# Patient Record
Sex: Female | Born: 1935 | Race: White | Hispanic: No | Smoking: Never smoker
Health system: Southern US, Community
[De-identification: ages and names within clinical notes are randomized; demographics above are authoritative.]

## PROBLEM LIST (undated history)

## (undated) DIAGNOSIS — F0280 Dementia in other diseases classified elsewhere without behavioral disturbance: Secondary | ICD-10-CM

## (undated) DIAGNOSIS — N812 Incomplete uterovaginal prolapse: Secondary | ICD-10-CM

## (undated) DIAGNOSIS — I10 Essential (primary) hypertension: Secondary | ICD-10-CM

## (undated) DIAGNOSIS — N8110 Cystocele, unspecified: Secondary | ICD-10-CM

## (undated) DIAGNOSIS — G309 Alzheimer's disease, unspecified: Secondary | ICD-10-CM

## (undated) DIAGNOSIS — C4490 Unspecified malignant neoplasm of skin, unspecified: Secondary | ICD-10-CM

## (undated) DIAGNOSIS — N816 Rectocele: Secondary | ICD-10-CM

## (undated) DIAGNOSIS — Z960 Presence of urogenital implants: Secondary | ICD-10-CM

## (undated) DIAGNOSIS — E039 Hypothyroidism, unspecified: Secondary | ICD-10-CM

## (undated) DIAGNOSIS — J189 Pneumonia, unspecified organism: Secondary | ICD-10-CM

## (undated) DIAGNOSIS — E7800 Pure hypercholesterolemia, unspecified: Secondary | ICD-10-CM

## (undated) DIAGNOSIS — S72009A Fracture of unspecified part of neck of unspecified femur, initial encounter for closed fracture: Secondary | ICD-10-CM

## (undated) HISTORY — DX: Rectocele: N81.10

## (undated) HISTORY — PX: FRACTURE SURGERY: SHX138

## (undated) HISTORY — PX: CHOLECYSTECTOMY: SHX55

## (undated) HISTORY — DX: Incomplete uterovaginal prolapse: N81.2

## (undated) HISTORY — DX: Fracture of unspecified part of neck of unspecified femur, initial encounter for closed fracture: S72.009A

## (undated) HISTORY — DX: Rectocele: N81.6

## (undated) HISTORY — PX: WRIST FRACTURE SURGERY: SHX121

---

## 1998-01-16 ENCOUNTER — Other Ambulatory Visit: Admission: RE | Admit: 1998-01-16 | Discharge: 1998-01-16 | Payer: Self-pay | Admitting: Family Medicine

## 1998-01-23 ENCOUNTER — Other Ambulatory Visit: Admission: RE | Admit: 1998-01-23 | Discharge: 1998-01-23 | Payer: Self-pay | Admitting: Family Medicine

## 1999-01-26 ENCOUNTER — Other Ambulatory Visit: Admission: RE | Admit: 1999-01-26 | Discharge: 1999-01-26 | Payer: Self-pay | Admitting: Family Medicine

## 1999-05-18 ENCOUNTER — Encounter: Admission: RE | Admit: 1999-05-18 | Discharge: 1999-05-18 | Payer: Self-pay | Admitting: Family Medicine

## 1999-05-18 ENCOUNTER — Encounter: Payer: Self-pay | Admitting: Family Medicine

## 1999-05-27 ENCOUNTER — Encounter: Payer: Self-pay | Admitting: General Surgery

## 1999-06-02 ENCOUNTER — Encounter: Payer: Self-pay | Admitting: General Surgery

## 1999-06-02 ENCOUNTER — Encounter (INDEPENDENT_AMBULATORY_CARE_PROVIDER_SITE_OTHER): Payer: Self-pay

## 1999-06-02 ENCOUNTER — Inpatient Hospital Stay (HOSPITAL_COMMUNITY): Admission: RE | Admit: 1999-06-02 | Discharge: 1999-06-04 | Payer: Self-pay | Admitting: General Surgery

## 1999-06-03 ENCOUNTER — Encounter: Payer: Self-pay | Admitting: General Surgery

## 1999-07-02 ENCOUNTER — Ambulatory Visit (HOSPITAL_COMMUNITY): Admission: RE | Admit: 1999-07-02 | Discharge: 1999-07-02 | Payer: Self-pay | Admitting: General Surgery

## 1999-07-02 ENCOUNTER — Encounter: Payer: Self-pay | Admitting: General Surgery

## 2000-01-26 ENCOUNTER — Other Ambulatory Visit: Admission: RE | Admit: 2000-01-26 | Discharge: 2000-01-26 | Payer: Self-pay | Admitting: Family Medicine

## 2000-02-17 ENCOUNTER — Encounter: Admission: RE | Admit: 2000-02-17 | Discharge: 2000-02-17 | Payer: Self-pay | Admitting: Family Medicine

## 2000-02-17 ENCOUNTER — Encounter: Payer: Self-pay | Admitting: Family Medicine

## 2002-02-01 ENCOUNTER — Other Ambulatory Visit: Admission: RE | Admit: 2002-02-01 | Discharge: 2002-02-01 | Payer: Self-pay | Admitting: Family Medicine

## 2002-05-01 ENCOUNTER — Encounter: Admission: RE | Admit: 2002-05-01 | Discharge: 2002-05-01 | Payer: Self-pay | Admitting: Family Medicine

## 2002-05-01 ENCOUNTER — Encounter: Payer: Self-pay | Admitting: Family Medicine

## 2003-02-27 ENCOUNTER — Encounter: Admission: RE | Admit: 2003-02-27 | Discharge: 2003-02-27 | Payer: Self-pay | Admitting: Family Medicine

## 2003-02-27 ENCOUNTER — Encounter: Payer: Self-pay | Admitting: Family Medicine

## 2003-05-03 ENCOUNTER — Encounter: Admission: RE | Admit: 2003-05-03 | Discharge: 2003-05-03 | Payer: Self-pay | Admitting: Family Medicine

## 2004-02-07 ENCOUNTER — Other Ambulatory Visit: Admission: RE | Admit: 2004-02-07 | Discharge: 2004-02-07 | Payer: Self-pay | Admitting: Family Medicine

## 2004-05-13 ENCOUNTER — Encounter: Admission: RE | Admit: 2004-05-13 | Discharge: 2004-05-13 | Payer: Self-pay | Admitting: Family Medicine

## 2005-02-18 ENCOUNTER — Encounter: Admission: RE | Admit: 2005-02-18 | Discharge: 2005-02-18 | Payer: Self-pay | Admitting: Family Medicine

## 2005-06-30 ENCOUNTER — Encounter: Admission: RE | Admit: 2005-06-30 | Discharge: 2005-06-30 | Payer: Self-pay | Admitting: Family Medicine

## 2005-12-09 ENCOUNTER — Emergency Department (HOSPITAL_COMMUNITY): Admission: EM | Admit: 2005-12-09 | Discharge: 2005-12-09 | Payer: Self-pay | Admitting: Emergency Medicine

## 2006-01-22 ENCOUNTER — Emergency Department (HOSPITAL_COMMUNITY): Admission: EM | Admit: 2006-01-22 | Discharge: 2006-01-22 | Payer: Self-pay | Admitting: Emergency Medicine

## 2006-01-24 ENCOUNTER — Ambulatory Visit (HOSPITAL_COMMUNITY): Admission: RE | Admit: 2006-01-24 | Discharge: 2006-01-24 | Payer: Self-pay | Admitting: Orthopedic Surgery

## 2006-05-06 ENCOUNTER — Other Ambulatory Visit: Admission: RE | Admit: 2006-05-06 | Discharge: 2006-05-06 | Payer: Self-pay | Admitting: Family Medicine

## 2007-03-23 ENCOUNTER — Encounter: Admission: RE | Admit: 2007-03-23 | Discharge: 2007-03-23 | Payer: Self-pay | Admitting: Family Medicine

## 2008-05-10 ENCOUNTER — Other Ambulatory Visit: Admission: RE | Admit: 2008-05-10 | Discharge: 2008-05-10 | Payer: Self-pay | Admitting: Family Medicine

## 2008-05-28 ENCOUNTER — Encounter: Admission: RE | Admit: 2008-05-28 | Discharge: 2008-05-28 | Payer: Self-pay | Admitting: Family Medicine

## 2009-06-24 ENCOUNTER — Encounter: Admission: RE | Admit: 2009-06-24 | Discharge: 2009-06-24 | Payer: Self-pay | Admitting: Internal Medicine

## 2010-07-05 DIAGNOSIS — J189 Pneumonia, unspecified organism: Secondary | ICD-10-CM

## 2010-07-05 HISTORY — DX: Pneumonia, unspecified organism: J18.9

## 2010-11-20 NOTE — Consult Note (Signed)
NAME:  Misty Carroll, Misty Carroll NO.:  [id number]   MEDICAL RECORD NO.:  [id number]          PATIENT TYPE:  EMS   LOCATION:  MAJO                         FACILITY:  [facility]   PHYSICIAN:  Artist Pais. Mina Marble, M.D.DATE OF BIRTH:  Nov 13, 1932   DATE OF CONSULTATION:  01/22/2006  DATE OF DISCHARGE:                                   CONSULTATION   REQUESTING PHYSICIANS:  Sharolyn Douglas, M.D.  Devoria Albe, M.D.   REASON FOR CONSULTATION:  Misty Carroll is a 77 year old right hand dominant  female who was cutting her lawn earlier today and was attacked by a group of  bees and presents with what appears to be a fracture to the distal radius on  her left side.  She is 77.   ALLERGIES:  She has no known drug allergies.   MEDICATIONS:  She is currently on an antihypertensive and an  antihypercholesterolemia drug, but she cannot recall their names.   PAST MEDICAL HISTORY:  Dr. Penni Bombard is her medical doctor.  She does not  smoke or drink.  Her past medical and surgical history are otherwise  noncontributory.  Family medical history is noncontributory.   PHYSICAL EXAMINATION:  The patient is a well-nourished female, pleasant,  alert and oriented x3.  Examination of her left upper extremity:  She has  pain, swelling, and deformity to left hand and wrist area.  She denies  numbness and tingling.  She has good digital range of motion, but again,  discomfort over the distal radius area.  X-rays show a displaced distal  radius fracture with 45 degrees of dorsal angulation and shortening, and  questionable interarticular extension.   PROCEDURE:  The patient was given a 2% plain lidocaine hematoma block.  Was  placed in finger trap traction with 10 pounds apparent traction.  Closed  reduction was performed.  Post reduction films showed good reduction in both  the AP and lateral plane.   DISPOSITION:  She was discharged from the emergency department with Percocet  for pain.  Follow up in my office  on Tuesday of this week.  She was given  instructions on splint and cast care, use of a sling, icing, and she is call  immediately for any signs of compartment syndrome, if not, we will see her  on Tuesday.      Artist Pais Mina Marble, M.D.  Electronically Signed     MAW/MEDQ  D:  01/22/2006  T:  01/22/2006  Job:  [id number]

## 2010-11-20 NOTE — Op Note (Signed)
NAME:  Misty Carroll, Misty Carroll                ACCOUNT NO.:  [id number]   MEDICAL RECORD NO.:  [id number]          PATIENT TYPE:  AMB   LOCATION:  SDS                          FACILITY:  [facility]   PHYSICIAN:  Artist Pais. Weingold, M.D.DATE OF BIRTH:  1932/07/23   DATE OF PROCEDURE:  01/24/2006  DATE OF DISCHARGE:                                 OPERATIVE REPORT   PREOPERATIVE DIAGNOSIS:  Displaced distal radius fracture, left, with carpal  tunnel syndrome.   POSTOPERATIVE DIAGNOSIS:  Displaced distal radius fracture, left, with  carpal tunnel syndrome.   PROCEDURE:  ORIF, left distal radius fracture, with DVR anatomic left plate  and left carpal tunnel release through separate incision.   SURGEON:  Artist Pais. Mina Marble, MD   ASSISTANT:  None.   ANESTHESIA:  General.   TOURNIQUET TIME:  48 minutes.   COMPLICATIONS:  None.   DRAINS:  None.   OPERATIVE REPORT:  The patient was taken to the operating room after the  induction of MAC and general anesthesia.  Left upper extremity was prepped  and draped in sterile fashion.  An Esmarch was used to exsanguinate the  limb.  Tourniquet was then inflated to 250 mmHg.  At this point in time, an  incision was made over the palpable border of the flexor carpi radialis  tendon.  The skin was incised.  The sheath overlying the FCR was incised.  The FCR was retracted to the midline, the radial artery to the lateral side.  The fascia underneath the FCR was incised.  Dissection was carried down to  the level of the pronator quadratus.  The pronator quadratus was  subperiosteally stripped off the distal radius, volar side.  The fracture  site was identified.  Was debrided of clot.  Reduction was performed.  The  DVR plate was then placed onto the volar aspect of the distal radius under  direct vision and fixed with 1 cortical screw in the slotted hole.  Intraoperative fluoroscopy revealed good placement of the screw, plate, and  reduction of the fracture.   The remaining 3 cortical screws were placed  proximally, and then the 7 smooth pegs distally.  After this was done,  intraoperative fluoroscopy revealed near anatomic reduction by lateral and  oblique view.  The wound was then thoroughly irrigated.  It was closed in  layers with 2-0 Vicryl and a 3-0 Prolene subcuticular stitch on the skin.  A  2nd incision was then made in the palmar aspect of the left hand in line  with the long finger metacarpal starting at Kaplan's cardinal line.  The  skin was incised.  The palmar fascia was identified and split.  The distal  edge of the transverse carpal ligament was identified, split with 15-blade.  The median nerve was identified and protected with a Therapist, nutritional.  The  remaining aspect of the transverse carpal ligament divided under direct  vision using curved blunt scissor, and the canal was inspected.  There were  no osseous lesions or  ganglions present.  It was irrigated and easily closed with 3-0 Prolene  subcuticular stitch.  Steri-Strips, 4 x 4's, fluffs and a compressive  dressing, and a volar splint was applied.  The patient tolerated the  procedure well and went to recovery in stable fashion.      Artist Pais Mina Marble, M.D.  Electronically Signed     MAW/MEDQ  D:  01/24/2006  T:  01/25/2006  Job:  [id number]

## 2011-04-09 ENCOUNTER — Encounter: Payer: Self-pay | Admitting: Gastroenterology

## 2011-06-07 ENCOUNTER — Ambulatory Visit: Payer: Self-pay

## 2011-06-07 ENCOUNTER — Other Ambulatory Visit: Payer: Self-pay | Admitting: Internal Medicine

## 2011-06-07 ENCOUNTER — Ambulatory Visit
Admission: RE | Admit: 2011-06-07 | Discharge: 2011-06-07 | Disposition: A | Discharge status: Home / Self Care | Payer: Medicare HMO | Source: Ambulatory Visit | Attending: Internal Medicine | Admitting: Internal Medicine

## 2011-06-07 DIAGNOSIS — Z1231 Encounter for screening mammogram for malignant neoplasm of breast: Secondary | ICD-10-CM

## 2011-09-14 ENCOUNTER — Ambulatory Visit (HOSPITAL_COMMUNITY)
Admission: RE | Admit: 2011-09-14 | Discharge: 2011-09-14 | Disposition: A | Discharge status: Home / Self Care | Payer: Medicare HMO | Source: Ambulatory Visit | Attending: Internal Medicine | Admitting: Internal Medicine

## 2011-09-14 DIAGNOSIS — I6529 Occlusion and stenosis of unspecified carotid artery: Secondary | ICD-10-CM | POA: Insufficient documentation

## 2011-09-14 DIAGNOSIS — R0989 Other specified symptoms and signs involving the circulatory and respiratory systems: Secondary | ICD-10-CM

## 2011-09-14 NOTE — Progress Notes (Signed)
Bilateral carotid artery duplex completed.  Preliminary report is no evidence of significant ICA stenosis. 

## 2012-06-06 ENCOUNTER — Other Ambulatory Visit: Payer: Self-pay | Admitting: Internal Medicine

## 2012-06-06 DIAGNOSIS — Z1231 Encounter for screening mammogram for malignant neoplasm of breast: Secondary | ICD-10-CM

## 2012-07-13 ENCOUNTER — Ambulatory Visit: Payer: Medicare HMO

## 2012-11-05 ENCOUNTER — Observation Stay: Payer: Self-pay | Admitting: Internal Medicine

## 2012-11-05 LAB — COMPREHENSIVE METABOLIC PANEL
Albumin: 3.3 g/dL — ABNORMAL LOW (ref 3.4–5.0)
Alkaline Phosphatase: 69 U/L (ref 50–136)
Anion Gap: 7 (ref 7–16)
BUN: 21 mg/dL — ABNORMAL HIGH (ref 7–18)
Co2: 30 mmol/L (ref 21–32)
Creatinine: 0.73 mg/dL (ref 0.60–1.30)
EGFR (African American): >60
EGFR (Non-African Amer.): >60
Glucose: 118 mg/dL — ABNORMAL HIGH (ref 65–99)
Osmolality: 261 (ref 275–301)
Potassium: 2.9 mmol/L — ABNORMAL LOW (ref 3.5–5.1)
SGOT(AST): 42 U/L — ABNORMAL HIGH (ref 15–37)
SGPT (ALT): 23 U/L (ref 12–78)
Sodium: 128 mmol/L — ABNORMAL LOW (ref 136–145)
Total Protein: 6.7 g/dL (ref 6.4–8.2)

## 2012-11-05 LAB — CBC
HGB: 12.2 g/dL (ref 12.0–16.0)
MCV: 91 fL (ref 80–100)
Platelet: 114 10*3/uL — ABNORMAL LOW (ref 150–440)
RBC: 3.91 10*6/uL (ref 3.80–5.20)
RDW: 13.1 % (ref 11.5–14.5)

## 2012-11-05 LAB — URINALYSIS, COMPLETE
Hyaline Cast: 7
Nitrite: NEGATIVE
RBC,UR: 16 /HPF (ref 0–5)
Specific Gravity: 1.018 (ref 1.003–1.030)
Squamous Epithelial: <1
WBC UR: 65 /HPF (ref 0–5)

## 2012-11-05 LAB — CK TOTAL AND CKMB (NOT AT ARMC): CK-MB: 2.5 ng/mL (ref 0.5–3.6)

## 2012-11-05 LAB — PRO B NATRIURETIC PEPTIDE: B-Type Natriuretic Peptide: 319 pg/mL (ref 0–450)

## 2012-11-06 LAB — CBC WITH DIFFERENTIAL/PLATELET
Basophil %: 0.8 %
HCT: 34.9 % — ABNORMAL LOW (ref 35.0–47.0)
HGB: 11.9 g/dL — ABNORMAL LOW (ref 12.0–16.0)
Lymphocyte #: 1.4 10*3/uL (ref 1.0–3.6)
Lymphocyte %: 39.9 %
MCH: 31 pg (ref 26.0–34.0)
MCHC: 34.1 g/dL (ref 32.0–36.0)
Monocyte #: 0.4 x10 3/mm (ref 0.2–0.9)
Monocyte %: 11.1 %
Neutrophil #: 1.6 10*3/uL (ref 1.4–6.5)
RBC: 3.84 10*6/uL (ref 3.80–5.20)
RDW: 13.3 % (ref 11.5–14.5)
WBC: 3.4 10*3/uL — ABNORMAL LOW (ref 3.6–11.0)

## 2012-11-06 LAB — BASIC METABOLIC PANEL
Chloride: 96 mmol/L — ABNORMAL LOW (ref 98–107)
Co2: 30 mmol/L (ref 21–32)
Glucose: 126 mg/dL — ABNORMAL HIGH (ref 65–99)
Osmolality: 268 (ref 275–301)
Potassium: 3.7 mmol/L (ref 3.5–5.1)

## 2012-11-06 LAB — TROPONIN I: Troponin-I: <0.02 ng/mL

## 2013-01-25 ENCOUNTER — Emergency Department (HOSPITAL_COMMUNITY)
Admission: EM | Admit: 2013-01-25 | Discharge: 2013-01-25 | Disposition: A | Discharge status: Home / Self Care | Payer: Medicare Other | Attending: Emergency Medicine | Admitting: Emergency Medicine

## 2013-01-25 ENCOUNTER — Encounter (HOSPITAL_COMMUNITY): Payer: Self-pay | Admitting: *Deleted

## 2013-01-25 DIAGNOSIS — E7800 Pure hypercholesterolemia, unspecified: Secondary | ICD-10-CM | POA: Insufficient documentation

## 2013-01-25 DIAGNOSIS — I10 Essential (primary) hypertension: Secondary | ICD-10-CM | POA: Insufficient documentation

## 2013-01-25 DIAGNOSIS — Z7982 Long term (current) use of aspirin: Secondary | ICD-10-CM | POA: Insufficient documentation

## 2013-01-25 DIAGNOSIS — J069 Acute upper respiratory infection, unspecified: Secondary | ICD-10-CM | POA: Insufficient documentation

## 2013-01-25 DIAGNOSIS — Z79899 Other long term (current) drug therapy: Secondary | ICD-10-CM | POA: Insufficient documentation

## 2013-01-25 DIAGNOSIS — R040 Epistaxis: Secondary | ICD-10-CM | POA: Diagnosis present

## 2013-01-25 HISTORY — DX: Essential (primary) hypertension: I10

## 2013-01-25 HISTORY — DX: Pure hypercholesterolemia, unspecified: E78.00

## 2013-01-25 MED ORDER — CEPHALEXIN 500 MG PO CAPS: 500.0000 mg | ORAL_CAPSULE | Freq: Four times a day (QID) | ORAL | Status: AC

## 2013-01-25 MED ORDER — OXYMETAZOLINE HCL 0.05 % NA SOLN
3.0000 | Freq: Once | NASAL | Status: AC
  Administered 2013-01-25: 3 via NASAL
  Filled 2013-01-25: qty 15

## 2013-01-25 NOTE — ED Provider Notes (Signed)
History    CSN: [id number] Arrival date & time 01/25/13  0901  First MD Initiated Contact with Patient 01/25/13 [phone]     Chief Complaint  Patient presents with  . Epistaxis   (Consider location/radiation/quality/duration/timing/severity/associated sxs/prior Treatment) Patient is a 79 y.o. female presenting with nosebleeds. The history is provided by the patient and a relative.  Epistaxis Location:  L nare Severity:  Moderate Duration:  40 minutes Timing:  Intermittent (At 445 this morning patient had an episode of approximately 30 minutes of epistaxis) Progression:  Partially resolved Chronicity:  Recurrent (Patient has had 5 episodes of epistaxis this past month. She also has had an episode of epistaxis requiring cauterization one time several years ago.) Context: aspirin use, hypertension (Well-controlled) and recent infection (Upper respiratory infection for the past 2 weeks. )   Context: not anticoagulants, not bleeding disorder, not drug use, not foreign body, not home oxygen and not nose picking   Relieved by:  Applying pressure Worsened by:  Nothing tried Ineffective treatments:  Applying pressure Associated symptoms: blood in oropharynx and congestion   Associated symptoms: no cough, no dizziness, no fever, no headaches, no sneezing and no sore throat   Risk factors: allergies   Risk factors: no alcohol use, no change in medication, no head and neck surgery, no head and neck tumor and no intranasal steroids    Past Medical History  Diagnosis Date  . Hypertension   . High cholesterol    History reviewed. No pertinent past surgical history. No family history on file. History  Substance Use Topics  . Smoking status: Never Smoker   . Smokeless tobacco: Not on file  . Alcohol Use: No   OB History   Grav Para Term Preterm Abortions TAB SAB Ect Mult Living                 Review of Systems  Constitutional: Negative for fever, chills, diaphoresis, activity change  and appetite change.  HENT: Positive for nosebleeds, congestion and rhinorrhea. Negative for sore throat, sneezing, drooling and trouble swallowing.   Eyes: Negative for discharge and redness.  Respiratory: Negative for cough, chest tightness, shortness of breath, wheezing and stridor.   Cardiovascular: Negative for chest pain and leg swelling.  Gastrointestinal: Negative for nausea, vomiting, abdominal pain, diarrhea, constipation and blood in stool.  Genitourinary: Negative for difficulty urinating.  Musculoskeletal: Negative for myalgias and arthralgias.  Skin: Negative for pallor.  Neurological: Negative for dizziness, syncope, speech difficulty, weakness, light-headedness and headaches.  Hematological: Negative for adenopathy. Does not bruise/bleed easily.  Psychiatric/Behavioral: Negative for confusion and agitation.    Allergies  Review of patient's allergies indicates no known allergies.  Home Medications   Current Outpatient Rx  Name  Route  Sig  Dispense  Refill  . amLODipine (NORVASC) 5 MG tablet   Oral   Take 5 mg by mouth daily.         Marland Kitchen aspirin EC 81 MG tablet   Oral   Take 81 mg by mouth daily.         Marland Kitchen levothyroxine (SYNTHROID, LEVOTHROID) 75 MCG tablet   Oral   Take 75 mcg by mouth daily before breakfast.         . lisinopril (PRINIVIL,ZESTRIL) 20 MG tablet   Oral   Take 20 mg by mouth daily.         . simvastatin (ZOCOR) 20 MG tablet   Oral   Take 20 mg by mouth at bedtime.          Marland Kitchen  cephALEXin (KEFLEX) 500 MG capsule   Oral   Take 1 capsule (500 mg total) by mouth 4 (four) times daily.   28 capsule   0    BP 142/75  Pulse 103  Temp(Src) 97.5 F (36.4 C) (Oral)  Resp 20  SpO2 100% Physical Exam  Constitutional: She is oriented to person, place, and time. She appears well-developed and well-nourished. No distress.  HENT:  Head: Normocephalic and atraumatic.  Right Ear: External ear normal.  Left Ear: External ear normal.  Nose:  Mucosal edema and rhinorrhea present. No nasal deformity, septal deviation or nasal septal hematoma. No epistaxis (No active bleeding, blood noted in the left nares).  Eyes: Conjunctivae and EOM are normal. Right eye exhibits no discharge. Left eye exhibits no discharge.  Neck: Normal range of motion. Neck supple. No JVD present.  Cardiovascular: Normal rate, regular rhythm and normal heart sounds.  Exam reveals no gallop and no friction rub.   No murmur heard. Pulmonary/Chest: Effort normal and breath sounds normal. No stridor. No respiratory distress. She has no wheezes. She has no rales. She exhibits no tenderness.  Abdominal: Soft. Bowel sounds are normal. She exhibits no distension. There is no tenderness. There is no rebound and no guarding.  Musculoskeletal: Normal range of motion. She exhibits no edema.  Neurological: She is alert and oriented to person, place, and time.  Skin: Skin is warm. No rash noted. She is not diaphoretic.  Psychiatric: She has a normal mood and affect. Her behavior is normal.    ED Course  Procedures (including critical care time) Labs Reviewed - No data to display No results found. 1. Left-sided epistaxis     MDM  Misty Carroll 79 y.o. with a history of nosebleeds who presents emergency department for epistaxis. She has had 2 episodes today. She presents with no active bleeding, but blood is noted in the left nares. No history of easy bleeding, bruising. No history of coagulopathy, or thrombocytopenia. Patient has allergies and recent URI. Bilateral nasal mucosal edema with some rhinorrhea. After patient manipulated her nose she began to have slight epistaxis of the same nares, but resolved with gentle pressure and afrin.   On reevaluation, epistaxis continued. On exam it appears to be posterior. Nasal packing indicated. Patient's left nares was packed with a rapid Rhino. It was inflated and secured appropriately to her cheek. She was instructed to followup  in 24-48 hours for reevaluation and likely removal of packing. ENT consulted, I spoke to Dr. Suszanne Conners, who saw the patient at bedside. He instructed the patient to followup in his office tomorrow at 1 PM. She was given prescription for Keflex for prophylaxis. She was given return precautions for signs and symptoms of infection. She was instructed not to remove packing herself.  She was given epistaxis precautions, including refrain from blowing her nose, picking her nose, but including her nose, or placing any foreign object in nose. If bleeding does not stop return to emergency department.   I discussed this patient's care with my attending, Dr. Dan Humphreys.   Sena Hitch, MD 01/25/13 [phone]

## 2013-01-25 NOTE — ED Provider Notes (Signed)
I saw and evaluated the patient, reviewed the resident's note and I agree with the findings and plan.   Ashby Dawes, MD 01/25/13 [phone]

## 2013-01-25 NOTE — ED Notes (Signed)
Per EMS pt from home with c/o nosebleed, started around 5am, lasted 40 minutes. Began again around 0800. No blood thinners.

## 2014-01-31 ENCOUNTER — Encounter: Payer: Self-pay | Admitting: Gastroenterology

## 2014-09-30 ENCOUNTER — Encounter: Payer: Self-pay | Admitting: Gastroenterology

## 2014-10-01 ENCOUNTER — Encounter: Payer: Self-pay | Admitting: Gastroenterology

## 2014-10-25 NOTE — H&P (Signed)
PATIENT NAME:  Misty Carroll, Misty Carroll MR#:  [id number] DATE OF BIRTH:  06-19-1933  DATE OF ADMISSION:  11/05/2012  PRIMARY CARE PHYSICIAN: Dr. Lorene Dy in [place]: Passed out at dinner.   HISTORY OF PRESENT ILLNESS: This is a 81 year old female who was sitting down starting to eat dinner. She did not feel good. She had a very stressful day. She was at a funeral for her sister, and she slumped back in the chair and she was held up by her family.  She was loss of consciousness for 3 minutes, and then when she came to she was talking fine. She feels okay now, but this is the second time that she passed out this week. Another time, she stood up and the room was spinning and then ended up falling over. She has been having some headache and some problems with allergies.  She has also had some leg cramping for the past week. In the ER, she was found to have a potassium of 2.9, and a low sodium and a positive urinalysis, and hospitalist services were contacted for further evaluation.   PAST MEDICAL HISTORY: Hypertension, hyperlipidemia and hypothyroidism.   PAST SURGICAL HISTORY: Cholecystectomy.   ALLERGIES: No known drug allergies.   MEDICATIONS: Include amlodipine 1 tablet daily unknown dose, aspirin 81 mg daily, levothyroxine low dose but unknown, simvastatin unknown dose at bedtime.  Valsartan, in looking at the pill on [employer] with the pharmacy tech, it is 320 mg valsartan and hydrochlorothiazide 25 mg daily   SOCIAL HISTORY: No smoking. No alcohol. No drug use. She lives by herself, used to do office work in the past.   FAMILY HISTORY: Father died at 35 of possibly old age. Mother died at 23 of diabetic complications.   REVIEW OF SYSTEMS:  CONSTITUTIONAL: Positive for headache. No fever, chills, or sweats. Positive for weight loss.  EYES: She does wear glasses and has cataracts. Ears, nose, mouth, and throat: Decreased hearing. Positive for sinus issue, sore throat, hoarse  voice.  CARDIOVASCULAR: No chest pain. No palpitations.  RESPIRATORY: No shortness of breath. Positive for dry cough. No sputum. No hemoptysis.  GASTROINTESTINAL: Positive for constipation, occasional nausea. No vomiting. No abdominal pain. No diarrhea. No bright red blood per rectum. No melena.  GENITOURINARY: No burning on urination. No hematuria.  MUSCULOSKELETAL: Positive for leg cramps.  INTEGUMENT: No rashes or eruptions.  NEUROLOGIC: Two episodes of fainting this week.  PSYCHIATRIC: No anxiety or depression.  ENDOCRINE: No thyroid problems.  HEMATOLOGIC/LYMPHATIC:  No anemia.   PHYSICAL EXAMINATION: VITAL SIGNS: Temperature 97.9, pulse 66, respirations 18, blood pressure 117/58, pulse oximetry 95% on room air.  GENERAL: No respiratory distress.  HEENT: Eyes: Conjunctivae and lids normal. Pupils equal, round, and reactive to light. Extraocular muscles intact. No nystagmus. Ears, nose, mouth, and throat: Tympanic membrane blocked by wax. Nasal mucosa: No erythema.  THROAT: No erythema. No exudate seen. Lips and gums no lesions.  NECK: No JVD. No bruits. No lymphadenopathy. No thyromegaly. No thyroid nodules palpated.  RESPIRATORY: Clear to auscultation. No use of accessory muscles to breathe. No rhonchi, rales or wheeze heard.  CARDIOVASCULAR: S1, S2 normal. A 2/6 systolic ejection murmur. Carotid upstroke 2+ bilaterally. No bruits. Dorsalis pedis pulses 2+ bilaterally. No edema of the lower extremity.  ABDOMEN: Soft, nontender. No organomegaly/splenomegaly. Normoactive bowel sounds. No masses felt.  LYMPHATIC: No lymph nodes in the neck.  MUSCULOSKELETAL: No clubbing, edema or cyanosis.  SKIN: No rashes or ulcers seen.  NEUROLOGICAL: Cranial  nerves II through XII are grossly intact. Deep tendon reflexes 1+ bilateral lower extremities.  PSYCHIATRIC: The patient is oriented to person, place and time.     LABORATORY AND RADIOLOGICAL DATA:  White blood cell count 4.1, H and H 12.2 and  35.6, platelet count of 114. No priors to compare to.  Troponin negative. Glucose 118, BUN 21, creatinine 0.73, sodium 128, potassium 2.9, chloride 91, CO2 30, calcium 8.4. Liver function tests: AST slightly elevated at 42. Urinalysis positive, +3 plus leukocyte esterase, 1+ blood. CPK normal. BNP 319.  Magnesium added on 1.7.  EKG: Normal sinus rhythm, 69 beats per minute, no acute ST-T wave changes.   ASSESSMENT AND PLAN: 1.  Hypokalemia, hypomagnesemia and hyponatremia:  I will discontinue hydrochlorothiazide, part of valsartan.  The patient will need a valsartan script upon going home. I will replace IV magnesium 2 grams IV x 1, IV potassium 40 mEq x 1. The patient was given 60 mEq orally of potassium. I will hydrate with normal saline, but I believe all the electrolyte abnormalities are probably due to the hydrochlorothiazide.  This is probably not a good medication for this patient.  2.  Syncope:  Could be a bunch of different reasons here. Could be related to the electrolytes, could be related to infection, could be related to stressful day with the funeral for her sister. The patient is not orthostatic. Could also be secondary to sinus issues that the patient has been complaining of.   I was unable to look into her ears, but I will put her on antibiotics for the infection, give some IV fluids. No further cardiac testing ordered by me.  3.  Urinary tract infection:  A urine culture was sent off. The patient does have some sinus issues and possibly ear issues. I will give Augmentin which would cover everything.  4.  Hyperlipidemia:  Continue simvastatin.  5.  Hypertension:  Need to stop hydrochlorothiazide secondary to electrolyte abnormalities.  Will need a script for valsartan upon going home. Continue the Norvasc.  6.  Thrombocytopenia:  No prior laboratory data .  We will have to follow up with Dr. Lorene Dy for that.  7.  Impacted cerumen of the ear:  We will give Debrox otic solution.  8.   Slightly elevated liver function tests:  Can follow this up as outpatient, could be secondary to the statin, but I am not going to stop it at this point.   TIME SPENT ON ADMISSION: 55 minutes.  ____________________________ Tana Conch. Leslye Peer, MD rjw:cb D: 11/05/2012 22:57:12 ET T: 11/05/2012 23:22:19 ET JOB#: [id number] cc: Tana Conch. Leslye Peer, MD, <Dictator> Dr. Lorene Dy, Brenda, [place] Marisue Brooklyn MD ELECTRONICALLY SIGNED 11/06/2012 21:34

## 2014-10-25 NOTE — Discharge Summary (Signed)
PATIENT NAME:  Misty Carroll, VITA MR#:  [id number] DATE OF BIRTH:  Aug 16, 1932  DATE OF ADMISSION:  11/05/2012 DATE OF DISCHARGE:  11/06/2012  DISCHARGE DIAGNOSES: 1.  Syncopal episode due to vasovagal and urinary tract infection.  2.  Urinary tract infection.  3.  Hypokalemia.  4.  Hypertension.  5.  Hyperlipidemia.   CONDITION ON DISCHARGE: Stable.   CODE STATUS ON DISCHARGE: FULL CODE.   MEDICATIONS ON DISCHARGE: Aspirin 81 mg 1 tablet once a day, amlodipine 5 mg orally once a day, levothyroxine 75 mcg once a day, simvastatin 20 mg oral tablet once a day, valsartan 320 mg oral tablet once a day, amoxicillin clavulanate 875 mg oral tablet 2 times a day.   DIET ON DISCHARGE: Low sodium.   DIET CONSISTENCY: Regular.   ACTIVITY: As tolerated.   TIMEFRAME TO FOLLOW UP:  Within 2 to 4 weeks with primary care physician.   HISTORY OF PRESENT ILLNESS: The patient is a 81 year old female who was sitting down and starting to eat dinner. She did not feel good and had a very stressful day at the funeral of her sister. She slumped back in the chair and was held up by her family. She had loss of consciousness for approximately 3 minutes.  When she came to consciousness, she was talking fine. She felt okay, but that was the second episode in the same week she passed out. She also had some leg cramping. She was found having potassium level of 2.9 and sodium level was low.  Urinalysis was positive, so she was admitted for further management.   HOSPITAL COURSE: She was admitted with: 1.  Electrolyte abnormalities, hypokalemia, hypomagnesemia and hyponatremia: Her hydrochlorothiazide was discontinued, and she was also only continued on valsartan.  She was replaced magnesium, potassium by IV and was started on normal saline to replace her sodium level.  2.  Syncopal episode:  It was thought possibly due to multiple conditions as her urinalysis was positive, that might be a contributing factor.  On top of that,  there was stressful event by death of her sister. She was at the funeral of her sister, and that might have contributed in her passing out episode. She remained totally fine on telemetry, no further complaints and feeling comfortable the next day, and so we discharged her  with advice to follow with primary care.  3.  Urinary tract infection: Cultures were noncontributory.  We gave her Augmentin for 4 days to be taken at home.  4.  Hypokalemia:  We replaced and corrected.  5.  Hyperlipidemia:  We continued simvastatin.  6.  Hypertension:  We  advised to stop hydrochlorothiazide and continue losartan, but then later on I got a call from the Pharmacy that she needs prior authorization for valsartan.  We are in the process for that. Meanwhile, I advised her to continue her  hydrochlorothiazide and valsartan to avoid hypertension.  7.  Thrombocytopenia:  There was no prior laboratory data, and we advised to follow with her primary care physician for that.   8.  Slightly elevated liver function tests:  I advised to follow as outpatient as there were no other complaints.   IMPORTANT HOSPITAL LABORATORY AND RADIOLOGICAL DATA:  Creatinine on presentation 0.73, remained stable. Sodium was 128 on presentation, came up to 132. Potassium was 2.9 which came up to 3.7. Chloride was 91 came up to 96 after replacement. Magnesium was 1.7 replaced IV.  All 3 troponins remained negative. TSH was 1.23. WBC count was  3.4, hemoglobin 11.9 and platelets 115. Urinalysis was positive with 40,000 CFU gram-negative rods. Urinalysis with 65 WBCs and 3+ leukocyte esterase present in the urine.   Chest x-ray, portable:  Finding of COPD, otherwise no acute cardiopulmonary disease, bilateral hilar and biapical pleural parenchymal thickening likely secondary to scarring.         TOTAL TIME SPENT ON THIS DISCHARGE: 45 minutes.   ____________________________ Ceasar Lund Anselm Jungling, MD vgv:cb D: 11/07/2012 17:37:22  ET T: 11/07/2012 21:10:11 ET JOB#: [id number]  cc: Ceasar Lund. Anselm Jungling, MD, <Dictator> Vaughan Basta MD ELECTRONICALLY SIGNED 11/16/2012 6:15

## 2015-03-06 ENCOUNTER — Encounter (HOSPITAL_COMMUNITY): Payer: Self-pay | Admitting: Emergency Medicine

## 2015-03-06 ENCOUNTER — Emergency Department (HOSPITAL_COMMUNITY)
Admission: EM | Admit: 2015-03-06 | Discharge: 2015-03-06 | Disposition: A | Discharge status: Home / Self Care | Payer: Medicare Other | Attending: Emergency Medicine | Admitting: Emergency Medicine

## 2015-03-06 DIAGNOSIS — I10 Essential (primary) hypertension: Secondary | ICD-10-CM | POA: Diagnosis not present

## 2015-03-06 DIAGNOSIS — E780 Pure hypercholesterolemia: Secondary | ICD-10-CM | POA: Insufficient documentation

## 2015-03-06 DIAGNOSIS — R040 Epistaxis: Secondary | ICD-10-CM | POA: Diagnosis not present

## 2015-03-06 DIAGNOSIS — Z79899 Other long term (current) drug therapy: Secondary | ICD-10-CM | POA: Insufficient documentation

## 2015-03-06 NOTE — ED Notes (Signed)
Pt presents with [facility] for nosebleed that started at 0410 this morning; pt reports history of same x 2 years ago where she was seen at ENT and was cauterized; EMS gave 2 sprays of Afrin enroute- no longer bleeding; pt CAOx4; denies pain

## 2015-03-06 NOTE — Discharge Instructions (Signed)

## 2015-03-06 NOTE — ED Provider Notes (Signed)
CSN: [id number]     Arrival date & time 03/06/15  1478 History   First MD Initiated Contact with Patient 03/06/15 0536     Chief Complaint  Patient presents with  . Epistaxis      HPI patient presents to the emergency department complaining of epistaxis from her left nostril.  She's had intermittent nosebleeds for the past.  She is not on anticoagulation.  She was unable to stop the bleeding with direct pressure.  EMS brought her to the emergency department and was able to relieve the epistaxis with 2 sprays of Afrin.  Patient is currently without any bleeding.  She has no complaints at this time.  No recent injury or trauma.  She does follow with ENT.  She does not routinely use saline spray for moisture.  Past Medical History  Diagnosis Date  . Hypertension   . High cholesterol    History reviewed. No pertinent past surgical history. History reviewed. No pertinent family history. Social History  Substance Use Topics  . Smoking status: Never Smoker   . Smokeless tobacco: None  . Alcohol Use: No   OB History    No data available     Review of Systems  All other systems reviewed and are negative.     Allergies  Review of patient's allergies indicates no known allergies.  Home Medications   Prior to Admission medications   Medication Sig Start Date End Date Taking? Authorizing Provider  amLODipine (NORVASC) 5 MG tablet Take 5 mg by mouth daily.   Yes Historical Provider, MD  levothyroxine (SYNTHROID, LEVOTHROID) 75 MCG tablet Take 75 mcg by mouth daily before breakfast.   Yes Historical Provider, MD  lisinopril (PRINIVIL,ZESTRIL) 20 MG tablet Take 20 mg by mouth daily.   Yes Historical Provider, MD  loratadine (CLARITIN) 10 MG tablet Take 10 mg by mouth daily.   Yes Historical Provider, MD  simvastatin (ZOCOR) 20 MG tablet Take 20 mg by mouth at bedtime.    Yes Historical Provider, MD   BP 142/57 mmHg  Pulse 78  Temp(Src) 97.4 F (36.3 C) (Oral)  Resp 18  SpO2  99% Physical Exam  Constitutional: She is oriented to person, place, and time. She appears well-developed and well-nourished.  HENT:  Head: Normocephalic.  Stigmata of recent bleeding in the left nostril.  No active bleeding.  Eyes: EOM are normal.  Neck: Normal range of motion.  Pulmonary/Chest: Effort normal.  Abdominal: She exhibits no distension.  Musculoskeletal: Normal range of motion.  Neurological: She is alert and oriented to person, place, and time.  Psychiatric: She has a normal mood and affect.  Nursing note and vitals reviewed.   ED Course  Procedures (including critical care time) Labs Review Labs Reviewed - No data to display  Imaging Review No results found. I have personally reviewed and evaluated these images and lab results as part of my medical decision-making.   EKG Interpretation None      MDM   Final diagnoses:  Epistaxis    ENT follow-up as needed.  No active bleeding at this time.  Ice pack applied.  Patient given instructions on how to attempt control of her nosebleeds at home including the use of Afrin with bleeding and saline spray when not bleeding    Misty Schmidt, MD 03/06/15 [phone]

## 2015-06-25 ENCOUNTER — Ambulatory Visit: Payer: Medicare Other | Admitting: Podiatry

## 2015-09-10 ENCOUNTER — Encounter: Payer: Self-pay | Admitting: Podiatry

## 2015-09-10 ENCOUNTER — Ambulatory Visit (INDEPENDENT_AMBULATORY_CARE_PROVIDER_SITE_OTHER): Payer: Medicare Other | Admitting: Podiatry

## 2015-09-10 ENCOUNTER — Ambulatory Visit (INDEPENDENT_AMBULATORY_CARE_PROVIDER_SITE_OTHER): Payer: Medicare Other

## 2015-09-10 VITALS — BP 145/66 | HR 82 | Resp 16

## 2015-09-10 DIAGNOSIS — M7661 Achilles tendinitis, right leg: Secondary | ICD-10-CM

## 2015-09-10 DIAGNOSIS — M7662 Achilles tendinitis, left leg: Secondary | ICD-10-CM | POA: Diagnosis not present

## 2015-09-10 DIAGNOSIS — M722 Plantar fascial fibromatosis: Secondary | ICD-10-CM

## 2015-09-10 MED ORDER — METHYLPREDNISOLONE 4 MG PO TBPK: ORAL_TABLET | ORAL | Status: DC

## 2015-09-10 MED ORDER — MELOXICAM 15 MG PO TABS: 15.0000 mg | ORAL_TABLET | Freq: Every day | ORAL | Status: DC

## 2015-09-10 NOTE — Progress Notes (Signed)
   Subjective:    Patient ID: Misty Carroll, female    DOB: [birth year], 82 y.o.   MRN: [id number]  HPI: She presents today chief complaint of swelling to her bilateral legs she also has a pain on the posterior aspect of her calcaneus. She states that think he may have plantar fasciitis. She states relates a pulling accident when she was trying to help lift in individual from a chair. She states that it hurt her heel. She spent to see her primary care provider but nothing was done.    Review of Systems  Neurological: Positive for numbness.  All other systems reviewed and are negative.      Objective:   Physical Exam: Vital signs are stable alert and oriented 3 pulses are palpable. Neurologic sensorium is intact. Orthopedic evaluation of his traits all joints distal to the ankle for range of motion without crepitation. She has pain on palpation posterior aspect of the right calcaneus at the insertion of the Achilles. Radiographs do demonstrate posterior calcaneal heel spurs proximally oriented swelling to the soft tissue of the Achilles tendon at its insertion site.        Assessment & Plan:  Retrocalcaneal heel spur and insertional Achilles tendinitis right over left.  Plan: Start her on a Medrol Dosepak to be followed by meloxicam. Injected the bursa overlying the Achilles just beneath the skin of the right heel. Also put her in a night splint and a Cam Walker. We discussed icing therapy shoe modification. Follow-up with her in 1 month

## 2015-09-10 NOTE — Patient Instructions (Addendum)

## 2015-10-27 ENCOUNTER — Ambulatory Visit: Payer: Medicare Other | Admitting: Podiatry

## 2016-09-03 ENCOUNTER — Ambulatory Visit (INDEPENDENT_AMBULATORY_CARE_PROVIDER_SITE_OTHER): Payer: Medicare Other | Admitting: Obstetrics & Gynecology

## 2016-09-03 VITALS — BP 130/80 | HR 78 | Ht 66.0 in | Wt 142.0 lb

## 2016-09-03 DIAGNOSIS — N814 Uterovaginal prolapse, unspecified: Secondary | ICD-10-CM | POA: Insufficient documentation

## 2016-09-03 DIAGNOSIS — N812 Incomplete uterovaginal prolapse: Secondary | ICD-10-CM | POA: Diagnosis not present

## 2016-09-03 NOTE — Progress Notes (Signed)
HPI:      [name] is a 83 y.o. No obstetric history on file. who presents today for her pessary follow up and examination related to her pelvic floor weakening.  Pt reports tolerating the pessary well with  no vaginal bleeding and   no vaginal discharge.  Symptoms of pelvic floor weakening have greatly improved. She is voiding and defecating without difficulty. She currently has a #3 ring pessary.  PMHx: The following portions of the patient's history were reviewed and updated as appropriate:           She  has a past medical history of Cystocele with rectocele; High cholesterol; Hypertension; and Uterovaginal prolapse, incomplete. She  has no past surgical history on file. Her family history is neg for cancer (gyn) or other concerns. She  reports that she has never smoked. She does not have any smokeless tobacco history on file. She reports that she does not drink alcohol or use drugs.  Review of Systems  Constitutional: Negative for chills, fever and malaise/fatigue.  HENT: Negative for congestion, sinus pain and sore throat.   Eyes: Negative for blurred vision and pain.  Respiratory: Negative for cough and wheezing.   Cardiovascular: Negative for chest pain and leg swelling.  Gastrointestinal: Negative for abdominal pain, constipation, diarrhea, heartburn, nausea and vomiting.  Genitourinary: Negative for dysuria, frequency, hematuria and urgency.  Musculoskeletal: Negative for back pain, joint pain, myalgias and neck pain.  Skin: Negative for itching and rash.  Neurological: Negative for dizziness, tremors and weakness.  Endo/Heme/Allergies: Does not bruise/bleed easily.  Psychiatric/Behavioral: Negative for depression. The patient is not nervous/anxious and does not have insomnia.     Physical Exam  Constitutional: She is oriented to person, place, and time. She appears well-developed and well-nourished. No distress.  Genitourinary: Rectum normal and vagina normal. Pelvic  exam was performed with patient supine. There is no rash or lesion on the right labia. There is no rash or lesion on the left labia. Vagina exhibits no lesion. No bleeding in the vagina.  Cardiovascular: Normal rate.   Pulmonary/Chest: Effort normal.  Abdominal: Soft. Bowel sounds are normal. She exhibits no distension. There is no tenderness. There is no rebound.  Musculoskeletal: Normal range of motion.  Neurological: She is alert and oriented to person, place, and time.  Skin: Skin is warm and dry.  Psychiatric: She has a normal mood and affect.  Vitals reviewed.   Pessary Care Pessary removed and cleaned.  Vagina checked - without erosions - pessary replaced.  Cervix also normal without lesion.  A/P:   Pessary was cleaned and replaced today. Instructions given for care. Concerning symptoms to observe for are counseled to patient. Follow up scheduled for 3 months.

## 2016-09-21 ENCOUNTER — Other Ambulatory Visit: Payer: Self-pay | Admitting: Internal Medicine

## 2016-09-21 DIAGNOSIS — E2839 Other primary ovarian failure: Secondary | ICD-10-CM

## 2016-09-21 DIAGNOSIS — Z1231 Encounter for screening mammogram for malignant neoplasm of breast: Secondary | ICD-10-CM

## 2016-09-27 ENCOUNTER — Encounter (HOSPITAL_COMMUNITY): Payer: Self-pay | Admitting: Emergency Medicine

## 2016-09-27 ENCOUNTER — Ambulatory Visit (HOSPITAL_COMMUNITY)
Admission: EM | Admit: 2016-09-27 | Discharge: 2016-09-27 | Disposition: A | Discharge status: Home / Self Care | Payer: Medicare Other

## 2016-09-27 DIAGNOSIS — R040 Epistaxis: Secondary | ICD-10-CM

## 2016-09-27 NOTE — Discharge Instructions (Signed)
Pt advise to keep an eye on BP and use humidification at home.  Use saline nasal spray for moistening nasal passages and Afrin when bleeding.  FU with ENT as needed basis.

## 2016-09-27 NOTE — ED Triage Notes (Signed)
Pt has had several nosebleeds today.  She is not on blood thinners, has had no infections or injury.

## 2016-09-27 NOTE — ED Provider Notes (Signed)
CSN: [id number]     Arrival date & time 09/27/16  1901 History   First MD Initiated Contact with Patient 09/27/16 2008     Chief Complaint  Patient presents with  . Epistaxis  83 Y/O female withy H/O HTN, presented with daughter in law with CC of recurrent nose bleed from left nares. Reports bleeding lasted for about 15 minutes and successfully stopped with applying pressure. Denies headache, dizziness, sinus pain, pressure. Denies injury/trauma. Pt not on any anticoagulation medication.  (Consider location/radiation/quality/duration/timing/severity/associated sxs/prior Treatment) The history is provided by the patient.  Epistaxis  Location:  L nare Severity:  Mild Duration:  15 minutes Timing:  Intermittent Progression:  Waxing and waning Chronicity:  Chronic Context: hypertension   Context: not anticoagulants, not aspirin use, not BiPAP, not bleeding disorder, not CPAP, not drug use, not foreign body, not home oxygen, not nose picking, not recent infection and not trauma   Relieved by:  Applying pressure   Past Medical History:  Diagnosis Date  . Cystocele with rectocele   . High cholesterol   . Hypertension   . Uterovaginal prolapse, incomplete    History reviewed. No pertinent surgical history. History reviewed. No pertinent family history. Social History  Substance Use Topics  . Smoking status: Never Smoker  . Smokeless tobacco: Never Used  . Alcohol use No   OB History    No data available     Review of Systems  Constitutional: Negative.   HENT: Positive for nosebleeds and rhinorrhea.   Respiratory: Negative.   Cardiovascular: Negative.     Allergies  Patient has no known allergies.  Home Medications   Prior to Admission medications   Medication Sig Start Date End Date Taking? Authorizing Provider  levothyroxine (SYNTHROID, LEVOTHROID) 75 MCG tablet Take 75 mcg by mouth daily before breakfast.   Yes Historical Provider, MD  lisinopril (PRINIVIL,ZESTRIL)  20 MG tablet Take 20 mg by mouth daily.   Yes Historical Provider, MD  loratadine (CLARITIN) 10 MG tablet Take 10 mg by mouth daily.   Yes Historical Provider, MD  simvastatin (ZOCOR) 20 MG tablet Take 20 mg by mouth at bedtime.    Yes Historical Provider, MD  amLODipine (NORVASC) 5 MG tablet Take 5 mg by mouth daily.    Historical Provider, MD  meloxicam (MOBIC) 15 MG tablet Take 1 tablet (15 mg total) by mouth daily. Patient not taking: Reported on 09/03/2016 09/10/15   Max T Hyatt, DPM  methylPREDNISolone (MEDROL) 4 MG TBPK tablet Tapering 6 day dose pack 09/10/15   Max T Hyatt, DPM   Meds Ordered and Administered this Visit  Medications - No data to display  BP (!) 159/56 (BP Location: Right Arm)   Pulse 72   Temp 97.9 F (36.6 C) (Oral)   SpO2 100%  No data found.   Physical Exam  Constitutional: She is oriented to person, place, and time. She appears well-developed and well-nourished. No distress.  HENT:  Head: Normocephalic.  Nose: Mucosal edema (Mild B/L nasal mucisal inflamation. with clear nasal discharge. No bleeding or dried blood noted to either nasal passages. ) and rhinorrhea present. No sinus tenderness, nasal deformity, septal deviation or nasal septal hematoma. No epistaxis.  No foreign bodies. Right sinus exhibits no maxillary sinus tenderness and no frontal sinus tenderness. Left sinus exhibits no maxillary sinus tenderness and no frontal sinus tenderness.  Mouth/Throat: Uvula is midline, oropharynx is clear and moist and mucous membranes are normal.  Eyes: Pupils are equal, round, and reactive  to light.  Neck: Neck supple.  Cardiovascular: Normal rate, regular rhythm and normal heart sounds.   Pulmonary/Chest: Effort normal and breath sounds normal. No respiratory distress. She has no wheezes. She has no rales. She exhibits no tenderness.  Neurological: She is alert and oriented to person, place, and time. No cranial nerve deficit. Coordination normal.  Skin: Skin is  warm.    Urgent Care Course     Procedures (including critical care time)  Labs Review Labs Reviewed - No data to display  Imaging Review No results found.   Visual Acuity Review  Right Eye Distance:   Left Eye Distance:   Bilateral Distance:    Right Eye Near:   Left Eye Near:    Bilateral Near:         MDM   1. Epistaxis   No active bleeding noted at the time of evaluation. Pt advise to keep an eye on BP and use humidification at home. Use saline nasal spray for moistening nasal passages and Afrin when bleeding. FU with ENT as needed basis. No Tx needed at this time    Adil Tugwell Jeanett Schlein, NP 09/27/16 2033

## 2016-10-07 ENCOUNTER — Ambulatory Visit: Payer: Private Health Insurance - Indemnity

## 2016-10-07 ENCOUNTER — Other Ambulatory Visit: Payer: Private Health Insurance - Indemnity

## 2016-10-07 ENCOUNTER — Ambulatory Visit (HOSPITAL_COMMUNITY)
Admission: EM | Admit: 2016-10-07 | Discharge: 2016-10-07 | Disposition: A | Discharge status: Home / Self Care | Payer: Medicare Other | Attending: Family Medicine | Admitting: Family Medicine

## 2016-10-07 ENCOUNTER — Encounter (HOSPITAL_COMMUNITY): Payer: Self-pay | Admitting: Family Medicine

## 2016-10-07 DIAGNOSIS — R040 Epistaxis: Secondary | ICD-10-CM | POA: Diagnosis not present

## 2016-10-07 DIAGNOSIS — I10 Essential (primary) hypertension: Secondary | ICD-10-CM

## 2016-10-07 MED ORDER — SILVER NITRATE-POT NITRATE 75-25 % EX MISC
CUTANEOUS | Status: AC
  Filled 2016-10-07: qty 2

## 2016-10-07 MED ORDER — LISINOPRIL 40 MG PO TABS: 40.0000 mg | ORAL_TABLET | Freq: Every day | ORAL | 3 refills | Status: DC

## 2016-10-07 NOTE — Discharge Instructions (Signed)
Follow-up with your doctor when you can get an appointment.  Use Afrin if any bleeding starts up again

## 2016-10-07 NOTE — ED Triage Notes (Signed)
Pt  Reports  l  Sided  Nosebleed     She  Is  Not    On  Am=ny  Blood thinners  And   She  Denies   Any  Injury    She  Was   Seen in er  10  Days  Ago  For  Same       She   Is  Awake  And  Alert  And  Oriented  At this  Time

## 2016-10-07 NOTE — ED Notes (Signed)
Pt   Reports has  lisonopril   rx  Already  With  Three  3   Refills on bottle dr Joseph Art notified   Discard   rx

## 2016-10-07 NOTE — ED Provider Notes (Signed)
[place]    CSN: [id number] Arrival date & time: 10/07/16  1414     History   Chief Complaint Chief Complaint  Patient presents with  . Epistaxis    HPI [name] is a 83 y.o. female.   This is an 83 year old woman who presents with nosebleed and left nasal passage. Began about an hour prior to arrival. She was on her way to see her doctor before coming here.  Patient has a history of high blood pressure. She's had no recent trauma to her nose area      Past Medical History:  Diagnosis Date  . Cystocele with rectocele   . High cholesterol   . Hypertension   . Uterovaginal prolapse, incomplete     Patient Active Problem List   Diagnosis Date Noted  . Cystocele with small rectocele and uterine descent 09/03/2016  . Uterovaginal prolapse, incomplete 09/03/2016  . Left-sided epistaxis 01/25/2013    History reviewed. No pertinent surgical history.  OB History    No data available       Home Medications    Prior to Admission medications   Medication Sig Start Date End Date Taking? Authorizing Provider  levothyroxine (SYNTHROID, LEVOTHROID) 75 MCG tablet Take 75 mcg by mouth daily before breakfast.    Historical Provider, MD  lisinopril (PRINIVIL,ZESTRIL) 40 MG tablet Take 1 tablet (40 mg total) by mouth daily. 10/07/16   Robyn Haber, MD  loratadine (CLARITIN) 10 MG tablet Take 10 mg by mouth daily.    Historical Provider, MD  simvastatin (ZOCOR) 20 MG tablet Take 20 mg by mouth at bedtime.     Historical Provider, MD    Family History History reviewed. No pertinent family history.  Social History Social History  Substance Use Topics  . Smoking status: Never Smoker  . Smokeless tobacco: Never Used  . Alcohol use No     Allergies   Patient has no known allergies.   Review of Systems Review of Systems  HENT: Positive for nosebleeds.   All other systems reviewed and are negative.    Physical Exam Triage Vital Signs ED  Triage Vitals [10/07/16 1425]  Enc Vitals Group     BP (!) 187/93     Pulse Rate 77     Resp 16     Temp 97.4 F (36.3 C)     Temp Source Oral     SpO2 100 %     Weight      Height      Head Circumference      Peak Flow      Pain Score      Pain Loc      Pain Edu?      Excl. in [place]?    No data found.   Updated Vital Signs BP (!) 187/93 (BP Location: Left Arm) Comment: notified rn   Pulse 77   Temp 97.4 F (36.3 C) (Oral)   Resp 16   SpO2 100%    Physical Exam  Constitutional: She is oriented to person, place, and time. She appears well-developed and well-nourished.  HENT:  Right Ear: External ear normal.  Left Ear: External ear normal.  Mouth/Throat: Oropharynx is clear and moist.  Patient has active bleeding from the left nostril on arrival. Afrin nasal spray was used to slow this down. She was coughing up clots the first part of the visit.  After using an Afrin nasal spray, a septal perforator was identified and cauterized with  silver nitrate. Bleeding stopped.  Eyes: Conjunctivae are normal. Pupils are equal, round, and reactive to light.  Neck: Normal range of motion. Neck supple.  Pulmonary/Chest: Effort normal.  Musculoskeletal: Normal range of motion.  Neurological: She is alert and oriented to person, place, and time.  Skin: Skin is warm and dry.  Nursing note and vitals reviewed.    UC Treatments / Results  Labs (all labs ordered are listed, but only abnormal results are displayed) Labs Reviewed - No data to display  EKG  EKG Interpretation None       Radiology No results found.  Procedures Procedures (including critical care time)  Medications Ordered in UC Medications - No data to display   Initial Impression / Assessment and Plan / UC Course  I have reviewed the triage vital signs and the nursing notes.  Pertinent labs & imaging results that were available during my care of the patient were reviewed by me and considered in my  medical decision making (see chart for details).     Final Clinical Impressions(s) / UC Diagnoses   Final diagnoses:  Epistaxis  Essential hypertension  Left-sided epistaxis    New Prescriptions New Prescriptions   LISINOPRIL (PRINIVIL,ZESTRIL) 40 MG TABLET    Take 1 tablet (40 mg total) by mouth daily.     Robyn Haber, MD 10/07/16 [phone]

## 2016-11-01 ENCOUNTER — Ambulatory Visit
Admission: RE | Admit: 2016-11-01 | Discharge: 2016-11-01 | Disposition: A | Discharge status: Home / Self Care | Payer: Medicare Other | Source: Ambulatory Visit | Attending: Internal Medicine | Admitting: Internal Medicine

## 2016-11-01 DIAGNOSIS — E2839 Other primary ovarian failure: Secondary | ICD-10-CM

## 2016-11-01 DIAGNOSIS — Z1231 Encounter for screening mammogram for malignant neoplasm of breast: Secondary | ICD-10-CM

## 2016-11-03 ENCOUNTER — Other Ambulatory Visit: Payer: Self-pay | Admitting: Internal Medicine

## 2016-11-03 DIAGNOSIS — R4182 Altered mental status, unspecified: Secondary | ICD-10-CM

## 2016-11-05 ENCOUNTER — Ambulatory Visit
Admission: RE | Admit: 2016-11-05 | Discharge: 2016-11-05 | Disposition: A | Discharge status: Home / Self Care | Payer: Medicare Other | Source: Ambulatory Visit | Attending: Internal Medicine | Admitting: Internal Medicine

## 2016-11-05 DIAGNOSIS — R4182 Altered mental status, unspecified: Secondary | ICD-10-CM

## 2016-11-17 ENCOUNTER — Encounter (HOSPITAL_COMMUNITY): Payer: Self-pay | Admitting: *Deleted

## 2016-11-17 ENCOUNTER — Emergency Department (HOSPITAL_COMMUNITY)
Admission: EM | Admit: 2016-11-17 | Discharge: 2016-11-18 | Disposition: A | Discharge status: Home / Self Care | Payer: Medicare Other | Attending: Emergency Medicine | Admitting: Emergency Medicine

## 2016-11-17 DIAGNOSIS — I10 Essential (primary) hypertension: Secondary | ICD-10-CM | POA: Insufficient documentation

## 2016-11-17 DIAGNOSIS — R440 Auditory hallucinations: Secondary | ICD-10-CM

## 2016-11-17 DIAGNOSIS — Z79899 Other long term (current) drug therapy: Secondary | ICD-10-CM | POA: Insufficient documentation

## 2016-11-17 LAB — URINALYSIS, ROUTINE W REFLEX MICROSCOPIC
Bilirubin Urine: NEGATIVE
Glucose, UA: NEGATIVE mg/dL
Ketones, ur: NEGATIVE mg/dL
Nitrite: NEGATIVE
Protein, ur: NEGATIVE mg/dL
Specific Gravity, Urine: 1.005 (ref 1.005–1.030)
pH: 8 (ref 5.0–8.0)

## 2016-11-17 LAB — COMPREHENSIVE METABOLIC PANEL
ALT: 13 U/L — ABNORMAL LOW (ref 14–54)
AST: 31 U/L (ref 15–41)
Albumin: 4 g/dL (ref 3.5–5.0)
Alkaline Phosphatase: 64 U/L (ref 38–126)
Anion gap: 10 (ref 5–15)
BUN: 12 mg/dL (ref 6–20)
CO2: 27 mmol/L (ref 22–32)
Calcium: 9.2 mg/dL (ref 8.9–10.3)
Chloride: 103 mmol/L (ref 101–111)
Creatinine, Ser: 0.8 mg/dL (ref 0.44–1.00)
GFR calc Af Amer: >60 mL/min (ref >60)
GFR calc non Af Amer: >60 mL/min (ref >60)
Glucose, Bld: 100 mg/dL — ABNORMAL HIGH (ref 65–99)
Potassium: 3.5 mmol/L (ref 3.5–5.1)
Sodium: 140 mmol/L (ref 135–145)
Total Bilirubin: 1 mg/dL (ref 0.3–1.2)
Total Protein: 6.7 g/dL (ref 6.5–8.1)

## 2016-11-17 LAB — CBC WITH DIFFERENTIAL/PLATELET
Basophils Absolute: 0 10*3/uL (ref 0.0–0.1)
Basophils Relative: 1 %
Eosinophils Absolute: 0.1 10*3/uL (ref 0.0–0.7)
Eosinophils Relative: 2 %
HCT: 44.1 % (ref 36.0–46.0)
Hemoglobin: 14.1 g/dL (ref 12.0–15.0)
Lymphocytes Relative: 36 %
Lymphs Abs: 2 10*3/uL (ref 0.7–4.0)
MCH: 30.7 pg (ref 26.0–34.0)
MCHC: 32 g/dL (ref 30.0–36.0)
MCV: 96.1 fL (ref 78.0–100.0)
Monocytes Absolute: 0.5 10*3/uL (ref 0.1–1.0)
Monocytes Relative: 9 %
Neutro Abs: 2.8 10*3/uL (ref 1.7–7.7)
Neutrophils Relative %: 52 %
Platelets: 178 10*3/uL (ref 150–400)
RBC: 4.59 MIL/uL (ref 3.87–5.11)
RDW: 13.8 % (ref 11.5–15.5)
WBC: 5.4 10*3/uL (ref 4.0–10.5)

## 2016-11-17 LAB — RAPID URINE DRUG SCREEN, HOSP PERFORMED
Amphetamines: NOT DETECTED
Barbiturates: NOT DETECTED
Benzodiazepines: NOT DETECTED
Cocaine: NOT DETECTED
Opiates: NOT DETECTED
Tetrahydrocannabinol: NOT DETECTED

## 2016-11-17 LAB — ETHANOL: Alcohol, Ethyl (B): <5 mg/dL (ref <5)

## 2016-11-17 LAB — TSH: TSH: 4.44 u[IU]/mL (ref 0.350–4.500)

## 2016-11-17 MED ORDER — SIMVASTATIN 20 MG PO TABS
20.0000 mg | ORAL_TABLET | Freq: Every day | ORAL | Status: DC
  Administered 2016-11-17: 20 mg via ORAL
  Filled 2016-11-17 (×2): qty 1

## 2016-11-17 MED ORDER — LORATADINE 10 MG PO TABS
10.0000 mg | ORAL_TABLET | Freq: Every day | ORAL | Status: DC
  Administered 2016-11-18: 10 mg via ORAL
  Filled 2016-11-17 (×2): qty 1

## 2016-11-17 MED ORDER — LISINOPRIL 20 MG PO TABS
40.0000 mg | ORAL_TABLET | Freq: Every day | ORAL | Status: DC
  Administered 2016-11-17 – 2016-11-18 (×2): 40 mg via ORAL
  Filled 2016-11-17 (×2): qty 2

## 2016-11-17 MED ORDER — LEVOTHYROXINE SODIUM 75 MCG PO TABS
75.0000 ug | ORAL_TABLET | Freq: Every day | ORAL | Status: DC
  Administered 2016-11-18 (×2): 75 ug via ORAL
  Filled 2016-11-17 (×3): qty 1

## 2016-11-17 MED ORDER — ONDANSETRON HCL 4 MG PO TABS: 4.0000 mg | ORAL_TABLET | Freq: Three times a day (TID) | ORAL | Status: DC | PRN

## 2016-11-17 MED ORDER — ACETAMINOPHEN 325 MG PO TABS: 650.0000 mg | ORAL_TABLET | ORAL | Status: DC | PRN

## 2016-11-17 MED ORDER — IBUPROFEN 400 MG PO TABS: 600.0000 mg | ORAL_TABLET | Freq: Three times a day (TID) | ORAL | Status: DC | PRN

## 2016-11-17 MED ORDER — ALUM & MAG HYDROXIDE-SIMETH 200-200-20 MG/5ML PO SUSP
30.0000 mL | ORAL | Status: DC | PRN
Start: 2016-11-17 — End: 2016-11-18

## 2016-11-17 MED ORDER — DEXTROSE 5 % IV SOLN
1.0000 g | Freq: Once | INTRAVENOUS | Status: AC
  Administered 2016-11-17: 1 g via INTRAVENOUS
  Filled 2016-11-17: qty 10

## 2016-11-17 MED ORDER — SODIUM CHLORIDE 0.9 % IV BOLUS (SEPSIS)
1000.0000 mL | Freq: Once | INTRAVENOUS | Status: AC
  Administered 2016-11-17: 1000 mL via INTRAVENOUS

## 2016-11-17 NOTE — ED Triage Notes (Signed)
Pt here for continued auditory hallucinations onset x 6 wks with hearing threats and singing, pt seen for symptoms prevously, pt had CT scan last week ordered by PCP, pt started on , pt lives alone, pts daughters have been staying with her d/t symtpoms, pt told by Mancel Bale, PCP yesterday to start taking Risperidone 0.25 mg tabs BID, per family pt wants an MRI, pts family called police this am d/t hallucinations and was told to come here for eval, pt supportive, pt calm & cooperative, pt reports hearing singing at this time

## 2016-11-17 NOTE — ED Notes (Signed)
While assisting patient to bathroom pt was paranoid that someone in the  facility was trying to harm her.  Pt states "the person in the room beside of me is trying to kill me and he thinks it's funny".  Pt also stated she would like a police officer to guard her.

## 2016-11-17 NOTE — ED Notes (Signed)
Pt ambulated to restroom, no issues. Pt back in room and placed on monitor.

## 2016-11-17 NOTE — BH Assessment (Addendum)
Tele Assessment Note   Misty Carroll is an 83 y.o. female reporting to ED due to [facility]. Pt sts she is hearing through hallucinations her neighbors saying sexual comments to her. Pt sts she it is upsetting her and the voices are commanding her to do say sexual things.  Pt sts she is very disturbed and needs it to stop. Pt sts she does not see the neighbors in her house; however she hears them.  Pt sts she is not seeing a therapist or a psychiatrist.  Pt sts she currently stays with her daughters and can return upon discharge. The pt sts her biggest stressor are the hallucinations which she wants to stop.  Pt denies abusing illicit drugs and alcohol.  Pt presented in a hospital gown with an unremarkable appearance.  Pt eye contact was fair and her speech was coherent. Pt had freedom of movement. Pt's mood appears suspicious and irritable.  Pt's affect is congruent with her mood.  Pt judgement appears to be impaired and her insight poor.  Pt is four times oriented.  Pt is recommended for inpatient treatment per Patriciaann Clan, NP. It is recommended she be referred to a [facility] facility.   Diagnosis: Delusional Disorder  Past Medical History:  Past Medical History:  Diagnosis Date  . Cystocele with rectocele   . High cholesterol   . Hypertension   . Uterovaginal prolapse, incomplete     History reviewed. No pertinent surgical history.  Family History: No family history on file.  Social History:  reports that she has never smoked. She has never used smokeless tobacco. She reports that she does not drink alcohol or use drugs.  Additional Social History:  Alcohol / Drug Use Pain Medications: See MAR Prescriptions: See MAR Over the Counter: See MAR History of alcohol / drug use?: No history of alcohol / drug abuse  CIWA: CIWA-Ar BP: 140/69 Pulse Rate: 68 COWS:    PATIENT STRENGTHS: (choose at least two) Communication skills Supportive family/friends  Allergies: No Known  Allergies  Home Medications:  (Not in a hospital admission)  OB/GYN Status:  No LMP recorded. Patient is postmenopausal.  General Assessment Data Location of Assessment: [facility] TTS Assessment: In system Is this a Tele or Face-to-Face Assessment?: Tele Assessment Is this an Initial Assessment or a Re-assessment for this encounter?: Initial Assessment Marital status: Widowed Chalmers name: Hooks Is patient pregnant?: No Pregnancy Status: No Living Arrangements: Children Can pt return to current living arrangement?: Yes Admission Status: Voluntary Is patient capable of signing voluntary admission?: Yes Referral Source: Self/Family/Friend Insurance type: Medicare     [place] Living Arrangements: Children Name of Psychiatrist: None reported Name of Therapist: None Reported  Education Status Highest grade of school patient has completed: 12th  Risk to self with the past 6 months Suicidal Ideation: No Has patient been a risk to self within the past 6 months prior to admission? : No Suicidal Intent: No Has patient had any suicidal intent within the past 6 months prior to admission? : No Is patient at risk for suicide?: No Suicidal Plan?: No Has patient had any suicidal plan within the past 6 months prior to admission? : No Access to Means: No What has been your use of drugs/alcohol within the last 12 months?: None reported Previous Attempts/Gestures: No How many times?: 0 Other Self Harm Risks: None reported Triggers for Past Attempts: Unknown Intentional Self Injurious Behavior: None Family Suicide History: No Recent stressful life event(s): Other (Comment) (  Hallucinations of  neighbor invading her home) Persecutory voices/beliefs?: Yes Depression: No Depression Symptoms:  (None reported) Substance abuse history and/or treatment for substance abuse?: No Suicide prevention information given to non-admitted patients: Not applicable  Risk to Others within the past  6 months Homicidal Ideation: Yes-Currently Present Does patient have any lifetime risk of violence toward others beyond the six months prior to admission? : No Thoughts of Harm to Others: No Current Homicidal Intent: No Current Homicidal Plan: No Access to Homicidal Means: No Identified Victim: N/A History of harm to others?: No Assessment of Violence: None Noted Violent Behavior Description: None reported Does patient have access to weapons?: No Criminal Charges Pending?: No Does patient have a court date: No Is patient on probation?: No  Psychosis Hallucinations: Auditory, With command (To engage in sexaul acts, she is worthless, etc) Delusions: Persecutory  Mental Status Report Appearance/Hygiene: In hospital gown, Unremarkable Eye Contact: Fair Motor Activity: Freedom of movement, Unremarkable Speech: Logical/coherent Level of Consciousness: Alert Mood: Angry Affect: Depressed, Irritable Anxiety Level: Minimal Thought Processes: Coherent Judgement: Impaired Orientation: Person, Place, Time, Situation Obsessive Compulsive Thoughts/Behaviors: None  Cognitive Functioning Concentration: Normal Memory: Recent Intact, Remote Intact IQ: Average Insight: Poor Impulse Control: Unable to Assess Appetite: Fair Weight Loss: 0 Weight Gain: 0 Sleep: Decreased Total Hours of Sleep: 4 Vegetative Symptoms: None  ADLScreening [facility] Assessment Services) Patient's cognitive ability adequate to safely complete daily activities?: Yes Patient able to express need for assistance with ADLs?: Yes Independently performs ADLs?: Yes (appropriate for developmental age)  Prior Inpatient Therapy Prior Inpatient Therapy: Yes Prior Therapy Dates: No Prior Therapy Facilty/Provider(s): no Reason for Treatment: NA  Prior Outpatient Therapy Prior Outpatient Therapy: No Prior Therapy Dates: NA Prior Therapy Facilty/Provider(s): NA Reason for Treatment: NA Does patient have an ACCT team?:  No Does patient have Intensive In-House Services?  : No Does patient have [facility] services? : No Does patient have P4CC services?: No  ADL Screening (condition at time of admission) Patient's cognitive ability adequate to safely complete daily activities?: Yes Patient able to express need for assistance with ADLs?: Yes Independently performs ADLs?: Yes (appropriate for developmental age)       Abuse/Neglect Assessment (Assessment to be complete while patient is alone) Physical Abuse: Denies Verbal Abuse: Yes, past (Comment) (Pt sts from about 3-4 wks in the hallucination type) Sexual Abuse: Denies Exploitation of patient/patient's resources: Denies Self-Neglect: Denies Values / Beliefs Cultural Requests During Hospitalization: None Consults Spiritual Care Consult Needed: No Social Work Consult Needed: No Regulatory affairs officer (For Healthcare) Does Patient Have a Medical Advance Directive?: No Would patient like information on creating a medical advance directive?: Yes (ED - Information included in AVS)    Additional Information 1:1 In Past 12 Months?: No CIRT Risk: No Elopement Risk: No Does patient have medical clearance?: Yes     Disposition:  Disposition Initial Assessment Completed for this Encounter: Yes Disposition of Patient: Inpatient treatment program (Per Patriciaann Clan NP) Type of inpatient treatment program: Adult  [place] 11/17/2016 11:29 PM

## 2016-11-18 DIAGNOSIS — R440 Auditory hallucinations: Secondary | ICD-10-CM | POA: Diagnosis not present

## 2016-11-18 MED ORDER — CEPHALEXIN 250 MG PO CAPS: 1000.0000 mg | ORAL_CAPSULE | Freq: Two times a day (BID) | ORAL | Status: DC

## 2016-11-18 MED ORDER — RISPERIDONE 0.5 MG PO TABS
0.5000 mg | ORAL_TABLET | Freq: Every day | ORAL | Status: DC
  Administered 2016-11-18: 0.5 mg via ORAL
  Filled 2016-11-18 (×2): qty 1

## 2016-11-18 MED ORDER — CARVEDILOL 12.5 MG PO TABS
6.2500 mg | ORAL_TABLET | Freq: Two times a day (BID) | ORAL | Status: DC
  Administered 2016-11-18 (×2): 6.25 mg via ORAL
  Filled 2016-11-18 (×2): qty 1

## 2016-11-18 NOTE — BH Assessment (Signed)
[place] Assessment Progress Note  Pt reassessed this morning. Pt was calm and cooperative. Pt was oriented x 4. Pt denied any previous IP or OP psych treatment. Pt did not appear to be responding to any internal stimuli. Pt denied that she had ever experienced AH prior to 6 weeks ago. Pt has been taking medications prescribed to her by her PCP to help mitigate her AH. Pt indicated that she feels the meds are helping as she is "not as scared or concerned" and the voices are coming "less frequent". Pt denied that the appearance of the AH has caused any major disturbances in her sleeping or eating habits. Pt denied that the voices have ever been command. Pt indicated that she feels safe to go home.   Staffed case with Jinny Blossom, NP, and it is recommended that pt be d/c with continued f/u with her PCP. Pt can also be provided with resources for OP therapy and psychiatry.   Kenna Gilbert. Lovena Le, [place], [place], LPCA Counselor

## 2016-11-18 NOTE — ED Notes (Signed)
Family advised they will be taking all of pt belongings home

## 2016-11-18 NOTE — ED Notes (Signed)
MD Tomi Bamberger notified of pt's blood pressure.  Questioned status states unaware of patient

## 2016-11-18 NOTE — ED Notes (Signed)
Case management at bedside.

## 2016-11-18 NOTE — ED Notes (Signed)
Helped patient get ready for discharge

## 2016-11-18 NOTE — ED Provider Notes (Signed)
[place] DEPT Provider Note   CSN: [id number] Arrival date & time: 11/17/16  1313     History   Chief Complaint Chief Complaint  Patient presents with  . Hallucinations    HPI Misty Carroll is a 83 y.o. female.  HPI Patient presents to the emergency department with auditory hallucinations that started around 6 weeks ago.  The patient is placed on Aricept and started having some hallucinations and then changed to [place] and developed worsening hallucinations as medicines were stopped and she was started on risperidone.  The patient states that the voices tell her of positive and negative pain.  She also hears singing.  She has also become very paranoid, stating that she feels like one of her new neighbors is the one talking to her.  The patient states that chest feels like there is wires and bugs in her house.  The patient states that she also feels like her stepdaughter is involved with this as well. The patient denies chest pain, shortness of breath, headache,blurred vision, neck pain, fever, cough, weakness, numbness, dizziness, anorexia, edema, abdominal pain, nausea, vomiting, diarrhea, rash, back pain, dysuria, hematemesis, bloody stool, near syncope, or syncope. Past Medical History:  Diagnosis Date  . Cystocele with rectocele   . High cholesterol   . Hypertension   . Uterovaginal prolapse, incomplete     Patient Active Problem List   Diagnosis Date Noted  . Cystocele with small rectocele and uterine descent 09/03/2016  . Uterovaginal prolapse, incomplete 09/03/2016  . Left-sided epistaxis 01/25/2013    History reviewed. No pertinent surgical history.  OB History    No data available       Home Medications    Prior to Admission medications   Medication Sig Start Date End Date Taking? Authorizing Provider  carvedilol (COREG) 6.25 MG tablet Take 6.25 mg by mouth 2 (two) times daily. 11/14/16  Yes [provider]  levothyroxine (SYNTHROID,  LEVOTHROID) 75 MCG tablet Take 75 mcg by mouth daily before breakfast.   Yes [provider]  lisinopril (PRINIVIL,ZESTRIL) 40 MG tablet Take 1 tablet (40 mg total) by mouth daily. 10/07/16  Yes Robyn Haber, MD  loratadine (CLARITIN) 10 MG tablet Take 10 mg by mouth daily.   Yes [provider]  risperiDONE (RISPERDAL) 0.25 MG tablet Take 0.5 mg by mouth at bedtime.   Yes [provider]  simvastatin (ZOCOR) 20 MG tablet Take 20 mg by mouth at bedtime.    Yes [provider]    Family History No family history on file.  Social History Social History  Substance Use Topics  . Smoking status: Never Smoker  . Smokeless tobacco: Never Used  . Alcohol use No     Allergies   Patient has no known allergies.   Review of Systems Review of Systems  All other systems negative except as documented in the HPI. All pertinent positives and negatives as reviewed in the HPI. Physical Exam Updated Vital Signs BP 140/69   Pulse 68   Temp 98.1 F (36.7 C) (Oral)   Resp 15   Ht 5\' 5"  (1.651 m)   Wt 64.4 kg   SpO2 100%   BMI 23.63 kg/m   Physical Exam  Constitutional: She is oriented to person, place, and time. She appears well-developed and well-nourished. No distress.  HENT:  Head: Normocephalic and atraumatic.  Mouth/Throat: Oropharynx is clear and moist.  Eyes: Pupils are equal, round, and reactive to light.  Neck: Normal range of motion.  Neck supple.  Cardiovascular: Normal rate, regular rhythm and normal heart sounds.  Exam reveals no gallop and no friction rub.   No murmur heard. Pulmonary/Chest: Effort normal and breath sounds normal. No respiratory distress. She has no wheezes.  Abdominal: Soft. Bowel sounds are normal. She exhibits no distension. There is no tenderness.  Neurological: She is alert and oriented to person, place, and time. She exhibits normal muscle tone. Coordination normal.  Skin: Skin is warm and dry. Capillary refill  takes less than 2 seconds. No rash noted. No erythema.  Psychiatric: She has a normal mood and affect. Her behavior is normal. She is actively hallucinating. She expresses no homicidal and no suicidal ideation. She expresses no suicidal plans and no homicidal plans.  Nursing note and vitals reviewed.    ED Treatments / Results  Labs (all labs ordered are listed, but only abnormal results are displayed) Labs Reviewed  COMPREHENSIVE METABOLIC PANEL - Abnormal; Notable for the following:       Result Value   Glucose, Bld 100 (*)    ALT 13 (*)    All other components within normal limits  URINALYSIS, ROUTINE W REFLEX MICROSCOPIC - Abnormal; Notable for the following:    Color, Urine STRAW (*)    Hgb urine dipstick MODERATE (*)    Leukocytes, UA LARGE (*)    Bacteria, UA RARE (*)    Squamous Epithelial / LPF 0-5 (*)    All other components within normal limits  URINE CULTURE  ETHANOL  CBC WITH DIFFERENTIAL/PLATELET  RAPID URINE DRUG SCREEN, HOSP PERFORMED  TSH    EKG  EKG Interpretation  Date/Time:  Wednesday Nov 17 2016 20:05:38 EDT Ventricular Rate:  57 PR Interval:  162 QRS Duration: 70 QT Interval:  466 QTC Calculation: 453 R Axis:   64 Text Interpretation:  Sinus bradycardia Possible Left atrial enlargement Borderline ECG no change from old Confirmed by Charlesetta Shanks [phone]) on 11/17/2016 8:40:21 PM       Radiology No results found.  Procedures Procedures (including critical care time)  Medications Ordered in ED Medications  acetaminophen (TYLENOL) tablet 650 mg (not administered)  ibuprofen (ADVIL,MOTRIN) tablet 600 mg (not administered)  ondansetron (ZOFRAN) tablet 4 mg (not administered)  alum & mag hydroxide-simeth (MAALOX/MYLANTA) 200-200-20 MG/5ML suspension 30 mL (not administered)  levothyroxine (SYNTHROID, LEVOTHROID) tablet 75 mcg (not administered)  lisinopril (PRINIVIL,ZESTRIL) tablet 40 mg (40 mg Oral Given 11/17/16 2210)  loratadine (CLARITIN)  tablet 10 mg (10 mg Oral Not Given 11/17/16 2208)  simvastatin (ZOCOR) tablet 20 mg (20 mg Oral Given 11/17/16 2230)  cephALEXin (KEFLEX) capsule 1,000 mg (not administered)  sodium chloride 0.9 % bolus 1,000 mL (0 mLs Intravenous Stopped 11/17/16 2010)  cefTRIAXone (ROCEPHIN) 1 g in dextrose 5 % 50 mL IVPB (0 g Intravenous Stopped 11/17/16 2152)     Initial Impression / Assessment and Plan / ED Course  I have reviewed the triage vital signs and the nursing notes.  Pertinent labs & imaging results that were available during my care of the patient were reviewed by me and considered in my medical decision making (see chart for details).  I feel that the patient will need a Geri psych evaluation for further delineation of the cause of her [employer], I feel that these are related to possible dementia and this is a manifestation of this patient does not have any medical reason at this time for the hallucination.  She had a CT scan done by her primary doctor last  week, which did not show any abnormalities.  The patient agrees to the plan and all questions were answered  Final Clinical Impressions(s) / ED Diagnoses   Final diagnoses:  None    New Prescriptions New Prescriptions   No medications on file     Dalia Heading, Hershal Coria 11/18/16 0142    Charlesetta Shanks, MD 11/20/16 [phone]

## 2016-11-18 NOTE — ED Notes (Signed)
Family at bedside. 

## 2016-11-18 NOTE — Progress Notes (Signed)
CSW engaged with Patient and Patient's daughter at Patient's bedside. CSW provided Patient with outpatient psychiatric resources. CSW also provided psycho-education regarding urinary tract infections and psychotic symptoms. Patient encouraged to follow up with PCP as well. Patient and Patient's daughter appreciative of resources. CSW signing off. Please contact should new need(s) arise.    Lorrine Kin, MSW, LCSW [facility] ED/96M Clinical Social Worker [phone]

## 2016-11-18 NOTE — Discharge Instructions (Signed)
Follow-up as discussed.  

## 2016-11-18 NOTE — ED Notes (Signed)
Meal tray delivered.

## 2016-11-18 NOTE — ED Notes (Signed)
Pt discharged and ambulated to lobby with daughter. Pt given blue paper scrub top in place of shirt that was taken home by family earlier today. Daughter expressed concern about risperidone continuance, RN relayed questions to EDP Little, MD. Pt was discharged without further questions or complaints.

## 2016-11-20 LAB — URINE CULTURE: Culture: >=100000 — AB

## 2016-11-21 ENCOUNTER — Telehealth: Payer: Self-pay

## 2016-11-21 NOTE — Telephone Encounter (Signed)
Post ED Visit - Positive Culture Follow-up: Unsuccessful Patient Follow-up  Culture assessed and recommendations reviewed by:  []  Elenor Quinones, Pharm.D. []  Heide Guile, Pharm.D., BCPS AQ-ID []  Parks Neptune, Pharm.D., BCPS []  Alycia Rossetti, Pharm.D., BCPS []  [place], Pharm.D., BCPS, AAHIVP [x]  Legrand Como, Pharm.D., BCPS, AAHIVP []  Salome Arnt, PharmD, BCPS []  Dimitri Ped, PharmD, BCPS []  Vincenza Hews, PharmD, BCPS  Positive urine culture  []  Patient discharged without antimicrobial prescription and treatment is now indicated [x]  Organism is resistant to prescribed ED discharge antimicrobial []  Patient with positive blood cultures   Unable to contact patient after 3 attempts, letter will be sent to address on file  Genia Del 11/21/2016, 10:13 AM

## 2016-11-21 NOTE — Progress Notes (Signed)
ED Antimicrobial Stewardship Positive Culture Follow Up   Misty Carroll is an 83 y.o. female who presented to [facility] on 11/17/2016 with a chief complaint of  Chief Complaint  Patient presents with  . Hallucinations    Recent Results (from the past 720 hour(s))  Urine culture     Status: Abnormal   Collection Time: 11/17/16  7:44 PM  Result Value Ref Range Status   Specimen Description URINE, RANDOM  Final   Special Requests NONE  Final   Culture >=100,000 COLONIES/mL ESCHERICHIA COLI (A)  Final   Report Status 11/20/2016 FINAL  Final   Organism ID, Bacteria ESCHERICHIA COLI (A)  Final      Susceptibility   Escherichia coli - MIC*    AMPICILLIN >=32 RESISTANT Resistant     CEFAZOLIN >=64 RESISTANT Resistant     CEFTRIAXONE <=1 SENSITIVE Sensitive     CIPROFLOXACIN <=0.25 SENSITIVE Sensitive     GENTAMICIN <=1 SENSITIVE Sensitive     IMIPENEM <=0.25 SENSITIVE Sensitive     NITROFURANTOIN <=16 SENSITIVE Sensitive     TRIMETH/SULFA <=20 SENSITIVE Sensitive     AMPICILLIN/SULBACTAM 16 INTERMEDIATE Intermediate     PIP/TAZO <=4 SENSITIVE Sensitive     Extended ESBL NEGATIVE Sensitive     * >=100,000 COLONIES/mL ESCHERICHIA COLI    [x]  Treated with cephalexin, organism resistant to prescribed antimicrobial []  Patient discharged originally without antimicrobial agent and treatment is now indicated  New antibiotic prescription: stop cephalexin and start septra ds 1 tablet PO BID for 5 days  ED Provider: Carlisle Cater, PA   Andrey Cota. Diona Foley, PharmD, [place] Clinical Pharmacist [phone] 11/21/2016, 9:20 AM

## 2016-11-26 ENCOUNTER — Telehealth: Payer: Self-pay | Admitting: *Deleted

## 2016-11-26 NOTE — Telephone Encounter (Signed)
Post ED Visit - Positive Culture Follow-up: Successful Patient Follow-Up  Culture assessed and recommendations reviewed by: []  Elenor Quinones, Pharm.D. []  Heide Guile, Pharm.D., BCPS AQ-ID []  Parks Neptune, Pharm.D., BCPS []  Alycia Rossetti, Pharm.D., BCPS []  [place], Pharm.D., BCPS, AAHIVP []  Legrand Como, Pharm.D., BCPS, AAHIVP []  Salome Arnt, PharmD, BCPS []  Dimitri Ped, PharmD, BCPS []  Vincenza Hews, PharmD, BCPS  Positive  culture  []  Patient discharged without antimicrobial prescription and treatment is now indicated [x]  Organism is resistant to prescribed ED discharge antimicrobial []  Patient with positive blood cultures  Changes discussed with ED provider Alecia Lemming, PA-C New antibiotic prescription Septra DS 1 tab PO BID x 5 days Called to [facility], [place], [phone]  Patient responded to letter sent to home, 11/26/2016 [place] 11/26/2016, 5:23 PM

## 2016-12-15 ENCOUNTER — Encounter: Payer: Self-pay | Admitting: Neurology

## 2016-12-15 ENCOUNTER — Ambulatory Visit (INDEPENDENT_AMBULATORY_CARE_PROVIDER_SITE_OTHER): Payer: Medicare Other | Admitting: Neurology

## 2016-12-15 VITALS — BP 186/84 | HR 69 | Ht 65.0 in | Wt 141.5 lb

## 2016-12-15 DIAGNOSIS — R440 Auditory hallucinations: Secondary | ICD-10-CM | POA: Diagnosis not present

## 2016-12-15 DIAGNOSIS — F22 Delusional disorders: Secondary | ICD-10-CM

## 2016-12-15 DIAGNOSIS — R413 Other amnesia: Secondary | ICD-10-CM

## 2016-12-15 NOTE — Progress Notes (Signed)
Subjective:    Patient ID: Misty Carroll is a 83 y.o. female.  HPI     Star Age, MD, PhD [facility] [address], [address] [place], [place] [address]  Dear Dr. Mancel Bale,  I saw your patient, Misty Carroll, upon your kind request in my neurologic clinic today for initial consultation of her dementia with behavioral disturbance, particularly auditory hallucinations. The patient is accompanied by her daughter today. As you know, Misty Carroll is an 83 year old right-handed woman with an underlying medical history of memory loss, hypertension, hyperlipidemia, who presented to the emergency room on 11/18/2016 with auditory hallucinations. She had side effects on Aricept and on Namenda with worsening hallucinations. She had a tele assessment with behavioral health and also saw the social worker while in the emergency room. Geriatric psychiatric inpatient treatment was recommended. However, upon reassessment she was deemed safe to be discharged home with outpatient psychiatric follow-up. She was found to have a urinary tract infection and was treated for this with 2 different antibiotics. She also developed some paranoid delusions, surrounding her neighbor. She even called the police once.  She had a head CT without contrast on 11/05/2016 which showed: IMPRESSION: Mild atrophy with mild periventricular small vessel disease. No intracranial mass hemorrhage, or extra-axial fluid collection. No acute infarct evident. Areas of arteriovascular calcification noted. Paranasal sinus disease present, most notably in a midportion left ethmoid air cell. Deviated nasal septum. She is currently not on any memory medication, was on Aricept back in 2016-10-22 and as she developed auditory hallucinations this was changed to [facility] in April 2018 but she only took it for about a week as her hallucinations became worse. I reviewed your office note from 11/23/2016. She has been on  Risperdal, which was increased to 1 mg qHS.  There is no FHx of dementia, twin sister had a recent UTI and also some memory issues and also hallucinations as I understand. She and twin sister are the youngest of 60 total kids, another set of twins before her. No obvious history of dementia, there is a family history of cancer. She had a total of 4 children, 2 boys and 2 girls, one son passed away. She has not driven since May, her license has lapsed and she has not renewed yet.  She denies headaches are one-sided weakness or numbness. She lives alone. Husband passed away in 1988-10-22. 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 She never smoked, she does not drink alcohol and never cared for it, she does not use illicit drugs and drinks caffeine limitation, one cup per day and sometimes soda per day. She does not drink water very much at all, maybe one bottle per day. Since she has been placed on Risperdal she has not had any hallucinations any longer. She has not seen geriatric psychiatrist.   Her Past Medical History Is Significant For: Past Medical History:  Diagnosis Date  . Cystocele with rectocele   . High cholesterol   . Hypertension   . Uterovaginal prolapse, incomplete     Her Past Surgical History Is Significant For: No past surgical history on file.  Her Family History Is Significant For: No family history on file.  Her Social History Is Significant For: Social History   Social History  . Marital status: Widowed    Spouse name: N/A  . Number of children: N/A  . Years of education: N/A   Social History Main  Topics  . Smoking status: Never Smoker  . Smokeless tobacco: Never Used  . Alcohol use No  . Drug use: No  . Sexual activity: Not Asked   Other Topics Concern  . None   Social History Narrative  . None    Her Allergies Are:  No Known Allergies:   Her Current Medications Are:  Outpatient Encounter Prescriptions as of 12/15/2016   Medication Sig  . carvedilol (COREG) 6.25 MG tablet Take 6.25 mg by mouth 2 (two) times daily.  Marland Kitchen levothyroxine (SYNTHROID, LEVOTHROID) 75 MCG tablet Take 75 mcg by mouth daily before breakfast.  . lisinopril (PRINIVIL,ZESTRIL) 40 MG tablet Take 1 tablet (40 mg total) by mouth daily.  Marland Kitchen loratadine (CLARITIN) 10 MG tablet Take 10 mg by mouth daily.  . risperiDONE (RISPERDAL) 0.25 MG tablet Take 0.5 mg by mouth at bedtime.  . simvastatin (ZOCOR) 20 MG tablet Take 20 mg by mouth at bedtime.   . [DISCONTINUED] amLODipine (NORVASC) 5 MG tablet Take 5 mg by mouth daily.  . [DISCONTINUED] donepezil (ARICEPT) 5 MG tablet Take 5 mg by mouth at bedtime.  . [DISCONTINUED] memantine (NAMENDA) 5 MG tablet Take 5 mg by mouth 2 (two) times daily.   No facility-administered encounter medications on file as of 12/15/2016.   :  Review of Systems:  Out of a complete 14 point review of systems, all are reviewed and negative with the exception of these symptoms as listed below: Review of Systems  Neurological:       Pt presents today as a new pt.with auditory hallucinations but has resolved since the ER visit. Pt is not taking namenda and aricept due to side effects. Pt is not being seen by a geriatric psych.     Objective:  Neurologic Exam  Physical Exam Physical Examination:   Vitals:   12/15/16 0953  BP: (!) 186/84  Pulse: 69    General Examination: The patient is a very pleasant 83 y.o. female in no acute distress. She appears well-developed and well-nourished and well groomed.   HEENT: Normocephalic, atraumatic, pupils are equal, round and reactive to light and accommodation. Funduscopic exam is normal with sharp disc margins noted. Extraocular tracking is good without limitation to gaze excursion or nystagmus noted. Normal smooth pursuit is noted. Hearing is grossly intact. Tympanic membranes are clear bilaterally. Face is symmetric with normal facial animation and normal facial sensation.  Speech is clear with no dysarthria noted. There is no hypophonia. There is no lip, neck/head, jaw or voice tremor. Neck is supple with full range of passive and active motion. There are no carotid bruits on auscultation. Oropharynx exam reveals: moderate mouth dryness, adequate dental hygiene and mild airway crowding. Mallampati is class II. Tongue protrudes centrally and palate elevates symmetrically.   Chest: Clear to auscultation without wheezing, rhonchi or crackles noted.  Heart: S1+S2+0, regular and normal without murmurs, rubs or gallops noted.   Abdomen: Soft, non-tender and non-distended with normal bowel sounds appreciated on auscultation.  Extremities: There is no pitting edema in the distal lower extremities bilaterally. Pedal pulses are intact.  Skin: Warm and dry without trophic changes noted. There are no varicose veins.  Musculoskeletal: exam reveals no obvious joint deformities, tenderness or joint swelling or erythema. Scar L wrist from Fx.   Neurologically:  Mental status: The patient is awake, alert and oriented in all 4 spheres. Her immediate and remote memory, attention, language skills and fund of knowledge are fairly appropriate. There is no evidence of aphasia,  agnosia, apraxia or anomia. Speech is clear with normal prosody and enunciation. Thought process is linear. Mood is normal and affect is normal.   On 12/15/2016: MMSE: 29/30, CDT: 4/4, AFT: 11/min.   Cranial nerves II - XII are as described above under HEENT exam. In addition: shoulder shrug is normal with equal shoulder height noted. Motor exam: Normal bulk, strength and tone is noted. There is no drift, tremor or rebound. Romberg is negative. Reflexes are 2+ throughout. Babinski: Toes are flexor bilaterally. Fine motor skills and coordination: intact with normal finger taps, normal hand movements, normal rapid alternating patting, normal foot taps and normal foot agility.  Cerebellar testing: No dysmetria or  intention tremor on finger to nose testing. Heel to shin is unremarkable bilaterally. There is no truncal or gait ataxia.  Sensory exam: intact to light touch, vibration, temperature sense in the upper and lower extremities, with the exception of decreased vibratory sense in the distal lower extremities bilaterally, symmetrical.  Gait, station and balance: She stands easily. No veering to one side is noted. No leaning to one side is noted. Posture is age-appropriate and stance is narrow based. Gait shows normal stride length and normal pace. No problems turning are noted.                Assessment and Plan:   In summary, Misty Carroll is a very pleasant 83 y.o.-year old female with an underlying medical history of memory loss, hypertension, hyperlipidemia, who has a history of memory loss, per daughter about 6 months plus. She is currently no longer on any memory medication she developed auditory hallucinations. These also improved after she was treated for UTI and is now on Risperdal 1 mg daily at bedtime. I did not suggest any new memory medication at this time, she does not have any overt family history of dementia. I would like to proceed with further testing in the form of brain MRI with and without contrast of possible unless she has metal in the left wrist that would preclude her from having an MRI. She had a recent head CT. We will proceed with an EEG to rule out any overt abnormalities. Sometimes seizure auras can present in the form of auditory hallucinations or visual hallucinations. We will call her with her MRI and EEG results. Furthermore, would like to proceed with formal neuropsychological testing and I made a referral to neuropsychology in that regard. I have advised the patient and her daughter that should she have reoccurrence of auditory hallucinations or other hallucinations or delusions, she would benefit from referral to a geriatric psychiatrist and they are encouraged to talk to you  about this. I will see her back after testing is completed in a few months. I suggested she stay better hydrated with water and also embark on some form of daily exercise program. I asked her daughter to monitor her driving but she currently has a lapsed license. I answered all their questions today and the patient and her daughter were in agreement.  Thank you very much for allowing me to participate in the care of this nice patient. If I can be of any further assistance to you please do not hesitate to call me at [phone].  Sincerely,   Star Age, MD, PhD

## 2016-12-15 NOTE — Patient Instructions (Addendum)
You have complaints of memory loss: memory loss or changes in cognitive function can have many reasons and does not always mean you have dementia. Conditions that can contribute to subjective or objective memory loss include: depression, stress, poor sleep from insomnia or sleep apnea, dehydration, fluctuation in blood sugar values, thyroid or electrolyte dysfunction and certain vitamin deficiencies. Dementia can be caused by stroke, brain atherosclerosis or brain vascular disease due to vascular risk factors (smoking, high blood pressure, high cholesterol, obesity and uncontrolled diabetes), certain degenerative brain disorders (including Parkinson's disease and Multiple sclerosis) and by Alzheimer's disease or other, more rare and sometimes hereditary causes. We will do some additional testing:   We will do a brain scan, called MRI and call you with the test results. We will have to schedule you for this on a separate date. This test requires authorization from your insurance, and we will take care of the insurance process. We will not start another medication as yet.   We will also request a formal cognitive test called neuropsychological evaluation which is done by a licensed neuropsychologist. We will make a referral in that regard. We will call you with brain scan test results and monitor your symptoms. Your memory loss is rather mild at this point, which, of course is reassuring.   If you have recurrence of hallucinations, you may benefit from seeing a geriatric psychiatrist.   We will do an EEG (brainwave test), which we will schedule. We will call you with the results.

## 2017-01-07 ENCOUNTER — Telehealth: Payer: Self-pay | Admitting: Obstetrics & Gynecology

## 2017-01-07 ENCOUNTER — Ambulatory Visit (INDEPENDENT_AMBULATORY_CARE_PROVIDER_SITE_OTHER): Payer: Medicare Other

## 2017-01-07 DIAGNOSIS — R413 Other amnesia: Secondary | ICD-10-CM

## 2017-01-07 DIAGNOSIS — R410 Disorientation, unspecified: Secondary | ICD-10-CM | POA: Diagnosis not present

## 2017-01-07 DIAGNOSIS — F22 Delusional disorders: Secondary | ICD-10-CM

## 2017-01-07 DIAGNOSIS — R440 Auditory hallucinations: Secondary | ICD-10-CM

## 2017-01-07 NOTE — Telephone Encounter (Signed)
Pt is schedule for 01/19/17 for follow up with dr. Kenton Kingfisher. Pt seems to think her pessary hasn't been clean in of 5 month and Patient was last seen 09/03/16. Please advise patient is we need to bring her in more sooner than schedule appt.

## 2017-01-07 NOTE — Telephone Encounter (Signed)
Pt aware 01/19/17 is fine

## 2017-01-09 ENCOUNTER — Ambulatory Visit
Admission: RE | Admit: 2017-01-09 | Discharge: 2017-01-09 | Disposition: A | Discharge status: Home / Self Care | Payer: Medicare Other | Source: Ambulatory Visit | Attending: Neurology | Admitting: Neurology

## 2017-01-09 DIAGNOSIS — R440 Auditory hallucinations: Secondary | ICD-10-CM

## 2017-01-09 DIAGNOSIS — F22 Delusional disorders: Secondary | ICD-10-CM

## 2017-01-09 DIAGNOSIS — R413 Other amnesia: Secondary | ICD-10-CM | POA: Diagnosis not present

## 2017-01-09 MED ORDER — GADOBENATE DIMEGLUMINE 529 MG/ML IV SOLN
13.0000 mL | Freq: Once | INTRAVENOUS | Status: AC | PRN
  Administered 2017-01-09: 13 mL via INTRAVENOUS

## 2017-01-10 ENCOUNTER — Telehealth: Payer: Self-pay

## 2017-01-10 NOTE — Progress Notes (Signed)
Please call patient regarding the recent brain MRI: The brain scan showed a normal structure of the brain and moderate volume loss which we call atrophy. This can be seen in patients with memory loss.   There were changes in the deeper structures of the brain, which we call white matter changes or microvascular changes. These were reported as mild in Her case. These are tiny white spots, that occur with time and are seen in a variety of conditions, including with normal aging, chronic hypertension, chronic headaches, especially migraine HAs, chronic diabetes, chronic hyperlipidemia. These are not strokes and no mass or lesion were seen which is reassuring. Again, there were no acute findings, such as a stroke, or mass or blood products. No further action is required on this test at this time, other than re-enforcing the importance of good blood pressure control, good cholesterol control, good blood sugar control, and weight management. Please remind patient to keep any upcoming appointments or tests and to call us with any interim questions, concerns, problems or updates.  Dr. Corliss Blacker office has been trying to get in touch to set up appointment. No answer per referral notes. Please assist with call back number. I would like for her appt to be before our FU in Oct.     Thanks,  Star Age, MD, PhD

## 2017-01-10 NOTE — Telephone Encounter (Signed)
-----   Message from Star Age, MD sent at 01/10/2017  7:45 AM EDT ----- Please call patient regarding the recent brain MRI: The brain scan showed a normal structure of the brain and moderate volume loss which we call atrophy. This can be seen in patients with memory loss.   There were changes in the deeper structures of the brain, which we call white matter changes or microvascular changes. These were reported as mild in Her case. These are tiny white spots, that occur with time and are seen in a variety of conditions, including with normal aging, chronic hypertension, chronic headaches, especially migraine HAs, chronic diabetes, chronic hyperlipidemia. These are not strokes and no mass or lesion were seen which is reassuring. Again, there were no acute findings, such as a stroke, or mass or blood products. No further action is required on this test at this time, other than re-enforcing the importance of good blood pressure control, good cholesterol control, good blood sugar control, and weight management. Please remind patient to keep any upcoming appointments or tests and to call us with any interim questions, concerns, problems or updates.  Dr. Corliss Blacker office has been trying to get in touch to set up appointment. No answer per referral notes. Please assist with call back number. I would like for her appt to be before our FU in Oct.     Thanks,  Star Age, MD, PhD

## 2017-01-10 NOTE — Telephone Encounter (Signed)
I called pt. I advised her that Dr. Rexene Alberts reviewed her MRI brain and found that pt has a normal structure of her brain but moderate volume loss which can be seen in pt's with memory loss. There were also changes in the deeper structures of the brain, called white matter changes, which can be seen in normal aging and a variety of chronic conditions, which I discussed with the pt. I advised her that no further action is required on this test, other than maintaining good BP, weight, cholesterol, and blood sugar levels. I asked pt to call Dr. Marcia Brash office at [phone] to schedule her neuro psych testing. Dr. Rexene Alberts needs these results before October's follow up. Pt verbalized understanding of results. Pt had no questions at this time but was encouraged to call back if questions arise.

## 2017-01-10 NOTE — Progress Notes (Signed)
Please call and advise the patient that the EEG or brain wave test we performed was reported as normal in the awake state. We checked for abnormal electrical discharges in the brain waves and the report suggested normal findings. No further action is required on this test at this time. Please remind patient to keep any upcoming appointments or tests and to call us with any interim questions, concerns, problems or updates. Thanks,  Laekyn Rayos, MD, PhD  

## 2017-01-10 NOTE — Procedures (Signed)
    History:  Misty Carroll is an 83 year old patient with a history of major with behavioral disturbance. The patient is having auditory hallucinations. The patient is being evaluated for these events.  This is a routine EEG. No skull defects are noted. Medications include Coreg, Synthroid, lisinopril, Claritin, Risperdal, and Zocor.   EEG classification: Normal awake  Description of the recording: The background rhythms of this recording consists of a fairly well modulated medium amplitude alpha rhythm of 8 Hz that is reactive to eye opening and closure. As the record progresses, the patient appears to remain in the waking state throughout the recording. Photic stimulation was performed, resulting in a bilateral and symmetric photic driving response. Hyperventilation was not performed. At no time during the recording does there appear to be evidence of spike or spike wave discharges or evidence of focal slowing. EKG monitor shows no evidence of cardiac rhythm abnormalities with a heart rate of 66.  Impression: This is a normal EEG recording in the waking state. No evidence of ictal or interictal discharges are seen.

## 2017-01-11 ENCOUNTER — Encounter: Payer: Self-pay | Admitting: Psychology

## 2017-01-11 ENCOUNTER — Telehealth: Payer: Self-pay

## 2017-01-11 NOTE — Telephone Encounter (Signed)
-----   Message from Star Age, MD sent at 01/10/2017  6:40 PM EDT ----- Please call and advise the patient that the EEG or brain wave test we performed was reported as normal in the awake state. We checked for abnormal electrical discharges in the brain waves and the report suggested normal findings. No further action is required on this test at this time. Please remind patient to keep any upcoming appointments or tests and to call us with any interim questions, concerns, problems or updates. Thanks,  Star Age, MD, PhD

## 2017-01-11 NOTE — Telephone Encounter (Signed)
I called pt. Spoke to pt and daughter, [name], per DPR, and advised them that pt's EEG was normal. Pt's daughter also asked for me to repeat the MRI results again from yesterday's phone call. Pt's daughter and the pt verbalized understanding of results. I encouraged them again to call Dr. Marcia Brash office and schedule the cognitive testing before October. Pt and pt's daugher had no questions at this time but was encouraged to call back if questions arise.

## 2017-01-19 ENCOUNTER — Ambulatory Visit (INDEPENDENT_AMBULATORY_CARE_PROVIDER_SITE_OTHER): Payer: Medicare Other | Admitting: Obstetrics & Gynecology

## 2017-01-19 ENCOUNTER — Encounter: Payer: Self-pay | Admitting: Obstetrics & Gynecology

## 2017-01-19 VITALS — BP 122/70 | HR 69 | Ht 65.0 in | Wt 141.0 lb

## 2017-01-19 DIAGNOSIS — N8111 Cystocele, midline: Secondary | ICD-10-CM | POA: Diagnosis not present

## 2017-01-19 DIAGNOSIS — N814 Uterovaginal prolapse, unspecified: Secondary | ICD-10-CM | POA: Insufficient documentation

## 2017-01-19 NOTE — Patient Instructions (Signed)
Pelvic Organ Prolapse Pelvic organ prolapse is the stretching, bulging, or dropping of pelvic organs into an abnormal position. It happens when the muscles and tissues that surround and support pelvic structures are stretched or weak. Pelvic organ prolapse can involve:  Vagina (vaginal prolapse).  Uterus (uterine prolapse).  Bladder (cystocele).  Rectum (rectocele).  Intestines (enterocele).  When organs other than the vagina are involved, they often bulge into the vagina or protrude from the vagina, depending on how severe the prolapse is. What are the causes? Causes of this condition include:  Pregnancy, labor, and childbirth.  Long-lasting (chronic) cough.  Chronic constipation.  Obesity.  Past pelvic surgery.  Aging. During and after menopause, a decreased production of the hormone estrogen can weaken pelvic ligaments and muscles.  Consistently lifting more than 50 lb (23 kg).  Buildup of fluid in the abdomen due to certain diseases and other conditions.  What are the signs or symptoms? Symptoms of this condition include:  Loss of bladder control when you cough, sneeze, strain, and exercise (stress incontinence). This may be worse immediately following childbirth, and it may gradually improve over time.  Feeling pressure in your pelvis or vagina. This pressure may increase when you cough or when you are having a bowel movement.  A bulge that protrudes from the opening of your vagina or against your vaginal wall. If your uterus protrudes through the opening of your vagina and rubs against your clothing, you may also experience soreness, ulcers, infection, pain, and bleeding.  Increased effort to have a bowel movement or urinate.  Pain in your low back.  Pain, discomfort, or disinterest in sexual intercourse.  Repeated bladder infections (urinary tract infections).  Difficulty inserting or inability to insert a tampon or applicator.  In some people, this  condition does not cause any symptoms. How is this diagnosed? Your health care provider may perform an internal and external vaginal and rectal exam. During the exam, you may be asked to cough and strain while you are lying down, sitting, and standing up. Your health care provider will determine if other tests are required, such as bladder function tests. How is this treated? In most cases, this condition needs to be treated only if it produces symptoms. No treatment is guaranteed to correct the prolapse or relieve the symptoms completely. Treatment may include:  Lifestyle changes, such as: ? Avoiding drinking beverages that contain caffeine. ? Increasing your intake of high-fiber foods. This can help to decrease constipation and straining during bowel movements. ? Emptying your bladder at scheduled times (bladder training therapy). This can help to reduce or avoid urinary incontinence. ? Losing weight if you are overweight or obese.  Estrogen. Estrogen may help mild prolapse by increasing the strength and tone of pelvic floor muscles.  Kegel exercises. These may help mild cases of prolapse by strengthening and tightening the muscles of the pelvic floor.  Pessary insertion. A pessary is a soft, flexible device that is placed into your vagina by your health care provider to help support the vaginal walls and keep pelvic organs in place.  Surgery. This is often the only form of treatment for severe prolapse. Different types of surgeries are available.  Follow these instructions at home:  Wear a sanitary pad or absorbent product if you have urinary incontinence.  Avoid heavy lifting and straining with exercise and work. Do not hold your breath when you perform mild to moderate lifting and exercise activities. Limit your activities as directed by your health care   provider.  Take medicines only as directed by your health care provider.  Perform Kegel exercises as directed by your health care  provider.  If you have a pessary, take care of it as directed by your health care provider. Contact a health care provider if:  Your symptoms interfere with your daily activities or sex life.  You need medicine to help with the discomfort.  You notice bleeding from the vagina that is not related to your period.  You have a fever.  You have pain or bleeding when you urinate.  You have bleeding when you have a bowel movement.  You lose urine when you have sex.  You have chronic constipation.  You have a pessary that falls out.  You have vaginal discharge that has a bad smell.  You have low abdominal pain or cramping that is unusual for you. This information is not intended to replace advice given to you by your health care provider. Make sure you discuss any questions you have with your health care provider. Document Released: 01/16/2014 Document Revised: 11/27/2015 Document Reviewed: 09/03/2013 Elsevier Interactive Patient Education  2018 [employer]  

## 2017-01-19 NOTE — Progress Notes (Signed)
  HPI:      [name] is a 83 y.o. [id number] who presents today for her pessary follow up and examination related to her pelvic floor weakening.  Pt reports tolerating the pessary well with  no vaginal bleeding and  no vaginal discharge.  Symptoms of pelvic floor weakening have greatly improved. She is voiding and defecating without difficulty. She currently has a ring pessary.  Recent UTI, on ABX.  PMHx: She  has a past medical history of Cystocele with rectocele; High cholesterol; Hypertension; and Uterovaginal prolapse, incomplete. Also,  has no past surgical history on file., family history includes Cancer in her brother and sister; Diabetes in her mother.,  reports that she has never smoked. She has never used smokeless tobacco. She reports that she does not drink alcohol or use drugs.  She has a current medication list which includes the following prescription(s): carvedilol, levothyroxine, lisinopril, loratadine, risperidone, risperidone, and simvastatin. Also, has No Known Allergies.  Review of Systems  All other systems reviewed and are negative.   Objective: BP 122/70   Pulse 69   Ht 5\' 5"  (1.651 m)   Wt 141 lb (64 kg)   BMI 23.46 kg/m  Physical Exam  Constitutional: She is oriented to person, place, and time. She appears well-developed and well-nourished. No distress.  Genitourinary: Vagina normal and uterus normal. Pelvic exam was performed with patient supine. There is no rash, tenderness or lesion on the right labia. There is no rash, tenderness or lesion on the left labia. No erythema or bleeding in the vagina. Right adnexum does not display mass and does not display tenderness. Left adnexum does not display mass and does not display tenderness. Cervix does not exhibit motion tenderness, discharge, polyp or nabothian cyst.   Uterus is mobile and midaxial. Uterus is not enlarged or exhibiting a mass.  Genitourinary Comments: Gr 2 POP  Abdominal: Soft. She exhibits no  distension. There is no tenderness.  Musculoskeletal: Normal range of motion.  Neurological: She is alert and oriented to person, place, and time. No cranial nerve deficit.  Skin: Skin is warm and dry.  Psychiatric: She has a normal mood and affect.   Pessary Care Pessary removed and cleaned.  Vagina checked - without erosions - pessary replaced.  A/P:  Pessary was cleaned and replaced today. Instructions given for care. Concerning symptoms to observe for are counseled to patient. Follow up scheduled for 3 months.  Barnett Applebaum, MD, [facility], [facility] 01/19/2017  10:33 AM

## 2017-04-11 ENCOUNTER — Ambulatory Visit (INDEPENDENT_AMBULATORY_CARE_PROVIDER_SITE_OTHER): Payer: Medicare Other | Admitting: Psychology

## 2017-04-11 ENCOUNTER — Encounter: Payer: Self-pay | Admitting: Psychology

## 2017-04-11 DIAGNOSIS — R413 Other amnesia: Secondary | ICD-10-CM

## 2017-04-11 NOTE — Progress Notes (Signed)
NEUROPSYCHOLOGICAL INTERVIEW (CPT: [id number])  Name: Misty Carroll Date of Birth: 03-05-1933 Date of Interview: 04/11/2017  Reason for Referral:  Misty Carroll is a 83 y.o. right-handed female who is referred for neuropsychological evaluation by Dr. Star Age of [facility] due to concerns about dementia. This patient is accompanied in the office by her daughter who supplements the history.  History of Presenting Problem:  Misty Carroll endorses memory concerns and cognitive decline but is unsure of the time course. Her daughter reported that in hindsight the family feels there has been very slow but progressive decline over the past 2 years. Then in April 2018 there was significantly increased confusion. She was put on memory medications but started having hallucinations and paranoid delusions. Aricept and Namenda were discontinued. She went to the ED on 11/17/2016 with auditory hallucinations. It was found that she had a UTI and she was treated for this. She was also put on risperidone at that time. She has not had any hallucinations in the past few months but she does continue to demonstrate some paranoia. When she cannot find something she is convinced that a family member stole it. Her daughter is concerned that her memory is continuing to worsen.   The patient saw Dr. Rexene Alberts for neurologic consultation on 12/15/2016. MMSE was 28/90, CDT 4/4, AFT 11/min. EEG performed on 01/07/2017 was normal. MRI of the brain on 01/09/2017 reportedly revealed moderate cortical atrophy and scattered T2/FLAIR hyperintense foci in the subcortical and deep white matter most consistent with mild chronic microvascular ischemic change.  There is no known fam hx of dementia.  Upon direct questioning, the patient's daughter reported the following with regard to current cognitive functioning:  Forgetting recent conversations/events: Yes Repeating statements/questions: Yes Misplacing/losing items:  Yes Forgetting appointments or other obligations: No, Family writes them down for her Forgetting to take medications: She has gotten better with this; the family had to be more involved in the past. She writes down when she takes them etc. Difficulty concentrating: No Starting but not finishing tasks: No Distracted easily: No Word-finding difficulty: No Writing difficulty: No Spelling difficulty: No Comprehension difficulty: Sometimes  The patient lives alone. She is no longer driving. Her driver's license expired and she did not renew it. She has not done much driving in the past year. When she was driving, she was having trouble with directions. She manages her medications independently. Her daughter helps/oversees the finances but the patient does write her own checks. She is less meticulous with her bookkeeping but is not having major issues. She does not do much cooking. She is very good at housekeeping. Interestingly, her daughter notes that she is "very clean now... 10 times better than ever before in her life". The patient states she enjoys it and views it as her job. She does not seem to be anxious about it or particularly obsessive/compulsive.  She denies any physical complaints. She has not had any falls. She denies trouble with balance or walking. Her family says she does not eat like she used to but she is not necessarily skipping/forgetting meals. She still is not drinking enough fluids.  The patient feels her mood is upbeat. She denies depression or anxiety. She has never been one to socialize much. She is satisfied with just spending time with family. She has a twin sister but does not see her much. She was closer with another sister but that sister passed away.  Psychiatric history was denied. There also is no  history of heavy alcohol use or substance abuse.   Social History: Born/Raised: [place]. She is a twin, they were youngest of 56 children.  Education: HS  graduate Occupational history: Retired, previously office work Marital history: Widowed (husband passed away 50). She had four children, one is deceased. Alcohol: None Tobacco: Never   Medical History: Past Medical History:  Diagnosis Date  . Cystocele with rectocele   . High cholesterol   . Hypertension   . Uterovaginal prolapse, incomplete      Current Medications:  Outpatient Encounter Prescriptions as of 04/11/2017  Medication Sig  . carvedilol (COREG) 6.25 MG tablet Take 6.25 mg by mouth 2 (two) times daily.  Marland Kitchen levothyroxine (SYNTHROID, LEVOTHROID) 75 MCG tablet Take 75 mcg by mouth daily before breakfast.  . lisinopril (PRINIVIL,ZESTRIL) 40 MG tablet Take 1 tablet (40 mg total) by mouth daily.  Marland Kitchen loratadine (CLARITIN) 10 MG tablet Take 10 mg by mouth daily.  . risperiDONE (RISPERDAL) 0.25 MG tablet Take 0.5 mg by mouth at bedtime.  . risperiDONE (RISPERDAL) 1 MG tablet   . simvastatin (ZOCOR) 20 MG tablet Take 20 mg by mouth at bedtime.    No facility-administered encounter medications on file as of 04/11/2017.      Behavioral Observations:   Appearance: Neatly, casually and appropriately dressed and groomed Gait: Ambulated independently, no gross abnormalities observed. Some difficulty arising from chair. Speech: Fluent; normal rate, rhythm and volume. No significant word finding difficulty. Thought process: Linear, goal directed Affect: Blunted, somewhat irritable with daughter, mildly suspicious Interpersonal: Pleasant, appropriate. Reduced insight.   TESTING: There is medical necessity to proceed with neuropsychological assessment as the results will be used to aid in differential diagnosis and clinical decision-making and to inform specific treatment recommendations. Per the patient, her daughter and medical records reviewed, there has been a change in cognitive functioning and a reasonable suspicion of dementia (rule out AD, rule out mixed dementia vascular and  AD).  Following the clinical interview, the patient completed a full battery of neuropsychological testing with my psychometrician under my supervision.   PLAN: The patient will return to see me for a follow-up session at which time her test performances and my impressions and treatment recommendations will be reviewed in detail.  Full report to follow.

## 2017-04-11 NOTE — Progress Notes (Signed)
   Neuropsychology Note  JASALYN FRYSINGER came in today for 1 hour of neuropsychological testing with technician, Milana Kidney, BS, under the supervision of Dr. Macarthur Critchley. The patient did not appear overtly distressed by the testing session, per behavioral observation or via self-report to the technician. Rest breaks were offered. Misty Carroll will return within 2 weeks for a feedback session with Dr. Si Raider at which time her test performances, clinical impressions and treatment recommendations will be reviewed in detail. The patient understands she can contact our office should she require our assistance before this time.  Full report to follow.

## 2017-04-18 ENCOUNTER — Ambulatory Visit: Payer: Medicare Other | Admitting: Neurology

## 2017-04-20 NOTE — Progress Notes (Signed)
NEUROPSYCHOLOGICAL EVALUATION   Name:    Misty Carroll Carroll  Date of Birth:   1933-02-14 Date of Interview:  04/11/2017 Date of Testing:  04/11/2017   Date of Feedback:  04/21/2017       Background Information:  Reason for Referral:  Misty Carroll Carroll is a 83 y.o. right handed female referred by Dr. Star Age of [facility] to assess her current level of cognitive functioning and assist in differential diagnosis. The current evaluation consisted of a review of available medical records, an interview with the patient and her daughter, and the completion of a neuropsychological testing battery. Informed consent was obtained.  History of Presenting Problem:  Misty Carroll Carroll endorses memory concerns and cognitive decline but is unsure of the time course. Her daughter reported that in hindsight the family feels there has been very slow but progressive decline over the past 2 years. Then in April 2018 there was significantly increased confusion. She was put on memory medications but started having hallucinations and paranoid delusions. Aricept and Namenda were discontinued. She went to the ED on 11/17/2016 with auditory hallucinations. It was found that she had a UTI and she was treated for this. She was also put on risperidone at that time. She has not had any hallucinations in the past few months but she does continue to demonstrate some paranoia. When she cannot find something she is convinced that a family member stole it. Her daughter is concerned that her memory is continuing to worsen.   The patient saw Dr. Rexene Alberts for neurologic consultation on 12/15/2016. MMSE was 28/30, CDT 4/4, AFT 11/min. EEG performed on 01/07/2017 was normal. MRI of the brain on 01/09/2017 reportedly revealed moderate cortical atrophy and scattered T2/FLAIR hyperintense foci in the subcortical and deep white matter most consistent with mild chronic microvascular ischemic change.  There is no known fam hx of  dementia.  Upon direct questioning, the patient's daughter reported the following with regard to current cognitive functioning:  Forgetting recent conversations/events: Yes Repeating statements/questions: Yes Misplacing/losing items: Yes Forgetting appointments or other obligations: No, Family writes them down for her Forgetting to take medications: She has gotten better with this; the family had to be more involved in the past. She writes down when she takes them etc. Difficulty concentrating: No Starting but not finishing tasks: No Distracted easily: No Word-finding difficulty: No Writing difficulty: No Spelling difficulty: No Comprehension difficulty: Sometimes  The patient lives alone. She is no longer driving. Her driver's license expired and she did not renew it. She has not done much driving in the past year. When she was driving, she was having trouble with directions. She manages her medications independently. Her daughter helps/oversees the finances but the patient does write her own checks. She is less meticulous with her bookkeeping but is not having major issues. She does not do much cooking. She is very good at housekeeping. Interestingly, her daughter notes that she is "very clean now... 10 times better than ever before in her life". The patient states she enjoys it and views it as her job. She does not seem to be anxious about it or particularly obsessive/compulsive.  She denies any physical complaints. She has not had any falls. She denies trouble with balance or walking. Her family says she does not eat like she used to but she is not necessarily skipping/forgetting meals. She still is not drinking enough fluids.  The patient feels her mood is upbeat. She denies depression or anxiety.  She has never been one to socialize much. She is satisfied with just spending time with family. She has a twin sister but does not see her much. She was closer with another sister but that  sister passed away.  Psychiatric history was denied. There also is no history of heavy alcohol use or substance abuse.   Social History: Born/Raised: [place]. She is a twin, they were youngest of 2 children.  Education: HS graduate Occupational history: Retired, previously office work Marital history: Widowed (husband passed away 80). She had four children, one is deceased. Alcohol: None Tobacco: Never   Medical History:  Past Medical History:  Diagnosis Date  . Cystocele with rectocele   . High cholesterol   . Hypertension   . Uterovaginal prolapse, incomplete     Current medications:  Outpatient Encounter Prescriptions as of 04/21/2017  Medication Sig  . carvedilol (COREG) 6.25 MG tablet Take 6.25 mg by mouth 2 (two) times daily.  Marland Kitchen levothyroxine (SYNTHROID, LEVOTHROID) 75 MCG tablet Take 75 mcg by mouth daily before breakfast.  . lisinopril (PRINIVIL,ZESTRIL) 40 MG tablet Take 1 tablet (40 mg total) by mouth daily.  Marland Kitchen loratadine (CLARITIN) 10 MG tablet Take 10 mg by mouth daily.  . risperiDONE (RISPERDAL) 0.25 MG tablet Take 0.5 mg by mouth at bedtime.  . risperiDONE (RISPERDAL) 1 MG tablet   . simvastatin (ZOCOR) 20 MG tablet Take 20 mg by mouth at bedtime.    No facility-administered encounter medications on file as of 04/21/2017.      Current Examination:  Behavioral Observations:   Appearance: Neatly, casually and appropriately dressed and groomed Gait: Ambulated independently, no gross abnormalities observed. Some difficulty arising from chair. Speech: Fluent; normal rate, rhythm and volume. No significant word finding difficulty. Thought process: Linear, goal directed Affect: Blunted, somewhat irritable with daughter, mildly suspicious Interpersonal: Pleasant, appropriate. Reduced insight. Orientation: Oriented to person, place and most aspects of time (inaccurate month). Unable to name the current President or his predecessor.   Tests Administered: . Test  of Premorbid Functioning (TOPF) . Wechsler Adult Intelligence Scale-Fourth Edition (WAIS-IV): Similarities, Music therapist, Coding and Digit Span subtests . Wechsler Memory Scale-Fourth Edition (WMS-IV) Older Adult Version (ages 45-90): Logical Memory I, II and Recognition subtests  . [employer] Verbal Learning Test - 2nd Edition (CVLT-2) Short Form . Repeatable Battery for the Assessment of Neuropsychological Status (RBANS) Form A:  Figure Copy and Recall subtests and Semantic Fluency subtest . Neuropsychological Assessment Battery (NAB) Language Module, Form 1: Naming subtest . Boston Diagnostic Aphasia Examination: Complex Ideational Material subtest . Controlled Oral Word Association Test (COWAT) . Trail Making Test A and B . Clock drawing test . Geriatric Depression Scale (GDS) 15 Item . Generalized Anxiety Disorder - 7 item screener (GAD-7)  Test Results: Note: Standardized scores are presented only for use by appropriately trained professionals and to allow for any future test-retest comparison. These scores should not be interpreted without consideration of all the information that is contained in the rest of the report. The most recent standardization samples from the test publisher or other sources were used whenever possible to derive standard scores; scores were corrected for age, gender, ethnicity and education when available.   Test Scores:  Test Name Raw Score Standardized Score Descriptor  TOPF 49/70 SS= 107 Average  WAIS-IV Subtests     Similarities 17/36 ss= 8 Average  Block Design 29/66 ss= 11 Average  Coding 51/135 ss= 12 High average  Digit Span Forward 11/16 ss= 12 High average  Digit Span Backward 7/16 ss= 10 Average  WMS-IV Subtests     LM I 9/53 ss= 4 Impaired  LM II 0/39 ss= 2 Severely impaired  LM II Recognition 10/23 Cum %: <2 Severely impaired  RBANS Subtests     Figure Copy 20/20 Z= 1.4 Superior  Figure Recall 5/20 Z= -1.6 Borderline  Semantic Fluency 13  Z= -1.2 Low average  CVLT-II Scores     Trial 1 2/9 Z= -3 Severely impaired  Trial 4 6/9 Z= -1 Low average  Trials 1-4 total 16/36 T= 29 Impaired  SD Free Recall 4/9 Z= -1.5 Borderline  LD Free Recall 0/9 Z= -2.5 Impaired  LD Cued Recall 1/9 Z= -3 Severely impaired  Recognition Discriminability 9/9 hits, 2 false positives Z= 0.5 Average  Forced Choice Recognition 9/9  WNL  NAB Language subtest     Naming 21/31 T= 26 Impaired  BDAE Subtest     Complex Ideational Material 7/12  Impaired  COWAT-FAS 31 T= 51 Average  COWAT-Animals 10 T= 39 Low average  Trail Making Test A  38" 0 errors T= 59 High average  Trail Making Test B  220" 2 errors T= 42 Low average  Clock Drawing   WNL  GDS-15 6/15  Mild  GAD-7 0/21  WNL      Description of Test Results:  Premorbid verbal intellectual abilities were estimated to have been within the average range based on a test of word reading. Psychomotor processing speed was high average. Auditory attention and working memory were high average to average, respectively. Visual-spatial construction was normal. Language abilities were variable. Specifically, confrontation naming was impaired, while semantic verbal fluency was low average. Auditory comprehension of complex ideational material was impaired. With regard to verbal memory, encoding and acquisition of non-contextual information (i.e., word list) was impaired. After a brief distracter task, free recall was borderline (4/9 items recalled). After a delay, free recall was impaired (0/9 items recalled). Cued recall was severely impaired (1/9 items recalled). Performance on a yes/no recognition task was average. On another verbal memory test, encoding and acquisition of contextual auditory information (i.e., short stories) was impaired. After a delay, free recall was severely impaired. Performance on a yes/no recognition task was severely impaired. With regard to non-verbal memory, delayed free recall of  visual information was borderline impaired. Executive functioning was within normal limits. Mental flexibility and set-shifting were low average on Trails B. Verbal fluency with phonemic search restrictions was average. Verbal abstract reasoning was average. Performance on a clock drawing task was within normal limits. On a self-report measure of mood, the patient's responses were suggestive of mild depression at the present time. Symptoms endorsed included: boredom, dropping interests/activities, not in good spirits most of the time, preferring to stay home and reduced energy. On a self-report measure of anxiety, the patient did not not endorse any generalized anxiety at the present time.    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 Results of the current cognitive evaluation reveal several areas of impairment, including verbal and non-verbal memory, confrontation naming, and clock drawing. Additionally, there is evidence that her cognitive deficits are interfering with her ability to manage complex tasks, such as driving and managing finances. As such, diagnostic criteria for a dementia syndrome are met.   The patient's cognitive profile is suggestive of hippocampal consolidation dysfunction and medial-temporal lobe involvement. Alzheimer's disease is the most likely etiology, given her cognitive profile, clinical features and atrophy noted on neuroimaging. Stage of dementia  appears to be mild, with no behavioral disturbance. She recently had behavioral disturbance in the context of UTI, which is common in individuals with underlying dementia.   She is reporting some mild depression, likely adjustment-related and sub-clinical. She denies any significant anxiety.  Recommendations: Based on the findings of the present evaluation, the following recommendations are offered:  1. From a neurocognitive perspective, the patient would be considered an appropriate candidate for  cholinesterase inhibitor therapy. There was concern that she had negative reactions to these medications in the past, but this was also when the patient was found to have a UTI. As such, restarting this medication may be considered. They will follow up with Dr. Rexene Alberts about this next week. 2. The patient should continue to receive assistance with complex ADLs, including finances and transportation. Family should continue to monitor her medications to make sure they are being taken correctly.  3. The patient's family may benefit from additional education and support regarding Alzheimer's disease and dementia caregiving. They are referred to the Alzheimer's Association ([place]). I also provided information on upcoming educational workshops in the area, as well as Tax adviser which offers a variety of services. 4. The patient should continue to participate in activities which are enjoyable to her. It would be ideal if she could continue to get some safe cardiovascular exercise. Social interaction is also important for mood, quality of life and mental stimulation. She may benefit from attending activities at a local senior center or having a companion visit her and take her places regularly. 5. Neuropsychological re-assessment in 1-2 years could be considered in order to monitor cognitive status, track progression of symptoms and further assist with treatment planning.    Feedback to Patient: Misty Carroll Carroll and her 2 daughters returned for a feedback appointment on 04/21/2017 to review the results of her neuropsychological evaluation with this provider. 30 minutes face-to-face time was spent reviewing her test results, my impressions and my recommendations as detailed above.    Total time spent on this patient's case: [id number] unit for interview with psychologist; [id number] units of testing by psychometrician under psychologist's supervision; [id number] units for medical record review,  scoring of neuropsychological tests, interpretation of test results, preparation of this report, and review of results to the patient by psychologist.      Thank you for your referral of Misty Carroll Carroll. Please feel free to contact me if you have any questions or concerns regarding this report.

## 2017-04-21 ENCOUNTER — Ambulatory Visit (INDEPENDENT_AMBULATORY_CARE_PROVIDER_SITE_OTHER): Payer: Medicare Other | Admitting: Psychology

## 2017-04-21 ENCOUNTER — Encounter: Payer: Self-pay | Admitting: Psychology

## 2017-04-21 DIAGNOSIS — G301 Alzheimer's disease with late onset: Secondary | ICD-10-CM

## 2017-04-21 DIAGNOSIS — F0280 Dementia in other diseases classified elsewhere without behavioral disturbance: Secondary | ICD-10-CM

## 2017-04-21 NOTE — Patient Instructions (Signed)
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 Results of the current cognitive evaluation reveal several areas of impairment, including verbal and non-verbal memory, confrontation naming, and clock drawing. Additionally, there is evidence that her cognitive deficits are interfering with her ability to manage complex tasks, such as driving and managing finances. As such, diagnostic criteria for a dementia syndrome are met.   The patient's cognitive profile is suggestive of hippocampal consolidation dysfunction and medial-temporal lobe involvement. Alzheimer's disease is the most likely etiology, given her cognitive profile, clinical features and atrophy noted on neuroimaging. Stage of dementia appears to be mild, with no behavioral disturbance. She recently had behavioral disturbance in the context of UTI, which is common in individuals with underlying dementia.   She is reporting some mild depression, likely adjustment-related and sub-clinical. She denies any significant anxiety.  Recommendations: Based on the findings of the present evaluation, the following recommendations are offered:  1. From a neurocognitive perspective, the patient would be considered an appropriate candidate for cholinesterase inhibitor therapy. However, she has had negative reactions to these medications in the past. 2. The patient should continue to receive assistance with complex ADLs, including finances and transportation. Family should continue to monitor her medications to make sure they are being taken correctly.  3. The patient's family may benefit from additional education and support regarding Alzheimer's disease and dementia caregiving. They are referred to the Alzheimer's Association ([place]). I also provided information on upcoming educational workshops in the area, as well as Tax adviser which offers a variety of services. 4. The patient should continue to  participate in activities which are enjoyable to her. It would be ideal if she could continue to get some safe cardiovascular exercise. Social interaction is also important for mood, quality of life and mental stimulation. She may benefit from attending activities at a local senior center or having a companion visit her and take her places regularly. 5. Neuropsychological re-assessment in 1-2 years could be considered in order to monitor cognitive status, track progression of symptoms and further assist with treatment planning.

## 2017-04-22 ENCOUNTER — Encounter: Payer: Self-pay | Admitting: Obstetrics & Gynecology

## 2017-04-22 ENCOUNTER — Ambulatory Visit (INDEPENDENT_AMBULATORY_CARE_PROVIDER_SITE_OTHER): Payer: Medicare Other | Admitting: Obstetrics & Gynecology

## 2017-04-22 VITALS — BP 110/70 | HR 64 | Ht 65.5 in | Wt 133.0 lb

## 2017-04-22 DIAGNOSIS — N812 Incomplete uterovaginal prolapse: Secondary | ICD-10-CM | POA: Diagnosis not present

## 2017-04-22 DIAGNOSIS — N814 Uterovaginal prolapse, unspecified: Secondary | ICD-10-CM

## 2017-04-22 NOTE — Progress Notes (Signed)
  HPI:      [name] is a 83 y.o. [id number] who presents today for her pessary follow up and examination related to her pelvic floor weakening.  Pt reports tolerating the pessary well with  no vaginal bleeding and  no vaginal discharge.  Symptoms of pelvic floor weakening have greatly improved. She is voiding and defecating without difficulty. She currently has a ring-type pessary.  PMHx: She  has a past medical history of Cystocele with rectocele; High cholesterol; Hypertension; and Uterovaginal prolapse, incomplete. Also,  has no past surgical history on file., family history includes Cancer in her brother and sister; Diabetes in her mother.,  reports that she has never smoked. She has never used smokeless tobacco. She reports that she does not drink alcohol or use drugs.  She has a current medication list which includes the following prescription(s): carvedilol, levothyroxine, lisinopril, loratadine, risperidone, risperidone, and simvastatin. Also, has No Known Allergies.  Review of Systems  All other systems reviewed and are negative.  Objective: BP 110/70   Pulse 64   Ht 5' 5.5" (1.664 m)   Wt 133 lb (60.3 kg)   BMI 21.80 kg/m  Physical Exam  Constitutional: She is oriented to person, place, and time. She appears well-developed and well-nourished. No distress.  Genitourinary: Vagina normal and uterus normal. Pelvic exam was performed with patient supine. There is no rash, tenderness or lesion on the right labia. There is no rash, tenderness or lesion on the left labia. No erythema or bleeding in the vagina. Right adnexum does not display mass and does not display tenderness. Left adnexum does not display mass and does not display tenderness. Cervix does not exhibit motion tenderness, discharge, polyp or nabothian cyst.   Uterus is mobile and midaxial. Uterus is not enlarged or exhibiting a mass.  Genitourinary Comments: Gr 3 uterine prolapse and gr 2 cystocele  Abdominal: Soft.  She exhibits no distension. There is no tenderness.  Musculoskeletal: Normal range of motion.  Neurological: She is alert and oriented to person, place, and time. No cranial nerve deficit.  Skin: Skin is warm and dry.  Psychiatric: She has a normal mood and affect.   Pessary Care Pessary removed and cleaned.  Vagina checked - without erosions - pessary replaced.  A/P:  Uterine Incomplete Prolapse and Cystocele, stable with pessary use Pessary was cleaned and replaced today. Instructions given for care. Concerning symptoms to observe for are counseled to patient. Follow up scheduled for 3 months. A total of 15 minutes were spent face-to-face with the patient during this encounter and over half of that time dealt with counseling and coordination of care.  Barnett Applebaum, MD, [facility], [facility] 04/22/2017  2:22 PM

## 2017-04-26 ENCOUNTER — Encounter: Payer: Self-pay | Admitting: Neurology

## 2017-04-26 ENCOUNTER — Ambulatory Visit (INDEPENDENT_AMBULATORY_CARE_PROVIDER_SITE_OTHER): Payer: Medicare Other | Admitting: Neurology

## 2017-04-26 VITALS — BP 150/72 | HR 69 | Ht 65.0 in | Wt 134.0 lb

## 2017-04-26 DIAGNOSIS — G301 Alzheimer's disease with late onset: Secondary | ICD-10-CM | POA: Diagnosis not present

## 2017-04-26 DIAGNOSIS — F02818 Dementia in other diseases classified elsewhere, unspecified severity, with other behavioral disturbance: Secondary | ICD-10-CM

## 2017-04-26 DIAGNOSIS — F0281 Dementia in other diseases classified elsewhere with behavioral disturbance: Secondary | ICD-10-CM

## 2017-04-26 MED ORDER — DONEPEZIL HCL 5 MG PO TABS: 5.0000 mg | ORAL_TABLET | Freq: Every day | ORAL | 5 refills | Status: DC

## 2017-04-26 NOTE — Progress Notes (Signed)
Subjective:    Patient ID: Misty Carroll is a 83 y.o. female.  HPI      Interim history:  Misty Carroll is an 83 year old right-handed woman with an underlying medical history of memory loss, hypertension, hyperlipidemia, who presents for follow-up consultation of her memory loss. The patient is accompanied by her daughter today. I first met her on 12/15/2016 at the request of her primary care physician, at which time she reported a prior diagnosis of memory loss, had side effects on Aricept and Namenda in the past. She had some behavioral concerns but also recent development of UTI and side effects and hallucinations may have been in the context of her UTI. I considered referral to geriatric psychiatry should she have additional problems in the future.  She was not on any memory medication at the time. I suggested further workup with an EEG, brain MRI, and neuropsychological evaluation. She had an EEG on 01/07/2017 which I reviewed: Impression: This is a normal EEG recording in the waking state. No evidence of ictal or interictal discharges are seen.  She had a brain MRI with and without contrast on 01/09/2017 which I reviewed: IMPRESSION:    This MRI of the brain with and without contrast shows the following: 1.    Moderate cortical atrophy. 2.    Scattered T2/FLAIR hyperintense foci in the subcortical and deep white matter most consistent with mild chronic microvascular ischemic change  3.    There is a normal enhancement pattern and there are no acute findings.   She had neuropsychological testing and initial interview with Dr. Bonita Quin on 04/11/2017 as well as a feedback appointment on 04/21/2017 and I reviewed the results:   <<Clinical Impressions: Mild dementia most likely due to Alzheimer's disease. Results of the current cognitive evaluation reveal several areas of impairment, including verbal and non-verbal memory, confrontation naming, and clock drawing. Additionally, there is  evidence that her cognitive deficits are interfering with her ability to manage complex tasks, such as driving and managing finances. As such, diagnostic criteria for a dementia syndrome are met.    The patient's cognitive profile is suggestive of hippocampal consolidation dysfunction and medial-temporal lobe involvement. Alzheimer's disease is the most likely etiology, given her cognitive profile, clinical features and atrophy noted on neuroimaging. Stage of dementia appears to be mild, with no behavioral disturbance. She recently had behavioral disturbance in the context of UTI, which is common in individuals with underlying dementia.    She is reporting some mild depression, likely adjustment-related and sub-clinical. She denies any significant anxiety.   Recommendations: Based on the findings of the present evaluation, the following recommendations are offered:   1. From a neurocognitive perspective, the patient would be considered an appropriate candidate for cholinesterase inhibitor therapy. There was concern that she had negative reactions to these medications in the past, but this was also when the patient was found to have a UTI. As such, restarting this medication may be considered. They will follow up with Dr. Rexene Alberts about this next week. 2. The patient should continue to receive assistance with complex ADLs, including finances and transportation. Family should continue to monitor her medications to make sure they are being taken correctly.  3. The patient's family may benefit from additional education and support regarding Alzheimer's disease and dementia caregiving. They are referred to the Alzheimer's Association ([place]). I also provided information on upcoming educational workshops in the area, as well as Tax adviser which offers a variety of services. 4.  The patient should continue to participate in activities which are enjoyable to her. It would be ideal if she  could continue to get some safe cardiovascular exercise. Social interaction is also important for mood, quality of life and mental stimulation. She may benefit from attending activities at a local senior center or having a companion visit her and take her places regularly. 5. Neuropsychological re-assessment in 1-2 years could be considered in order to monitor cognitive status, track progression of symptoms and further assist with treatment planning.  >>  Today, 04/26/2017: She reports feeling okay, stable, no falls, no mood related issues, no recent behavioral issues, no hallucinations.Still takes risperidone 1 mg strength around 6 PM. She has lost a little bit of weight. She does not exercise on a regular basis, does not have access to a gym and there is no side walks around. Her other daughter lives about 10 minutes away, this daughter lives about 30 minutes away, they do check on her nearly every day. She does not always drink enough water. She does not typically drink very much at all. Likes to drink coffee in the morning and maybe 4 ounces of Pepsi per day, no milk typically, no juice.  The patient's allergies, current medications, family history, past medical history, past social history, past surgical history and problem list were reviewed and updated as appropriate.   Previously (copied from previous notes for reference):   12/15/2016: (She) presented to the emergency room on 11/18/2016 with auditory hallucinations. She had side effects on Aricept and on Namenda with worsening hallucinations. She had a tele assessment with behavioral health and also saw the social worker while in the emergency room. Geriatric psychiatric inpatient treatment was recommended. However, upon reassessment she was deemed safe to be discharged home with outpatient psychiatric follow-up. She was found to have a urinary tract infection and was treated for this with 2 different antibiotics. She also developed some paranoid  delusions, surrounding her neighbor. She even called the police once.  She had a head CT without contrast on 11/05/2016 which showed: IMPRESSION: Mild atrophy with mild periventricular small vessel disease. No intracranial mass hemorrhage, or extra-axial fluid collection. No acute infarct evident. Areas of arteriovascular calcification noted. Paranasal sinus disease present, most notably in a midportion left ethmoid air cell. Deviated nasal septum. She is currently not on any memory medication, was on Aricept back in 09/20/2016 and as she developed auditory hallucinations this was changed to [facility] in April 2018 but she only took it for about a week as her hallucinations became worse. I reviewed your office note from 11/23/2016. She has been on Risperdal, which was increased to 1 mg qHS.  There is no FHx of dementia, twin sister had a recent UTI and also some memory issues and also hallucinations as I understand. She and twin sister are the youngest of 31 total kids, another set of twins before her. No obvious history of dementia, there is a family history of cancer. She had a total of 4 children, 2 boys and 2 girls, one son passed away. She has not driven since May, her license has lapsed and she has not renewed yet.  She denies headaches are one-sided weakness or numbness. She lives alone. Husband passed away in 09-20-1988. 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 She never smoked, she does not drink alcohol and never cared for it, she does not use illicit drugs and drinks caffeine limitation, one cup  per day and sometimes soda per day. She does not drink water very much at all, maybe one bottle per day. Since she has been placed on Risperdal she has not had any hallucinations any longer. She has not seen geriatric psychiatrist.  Her Past Medical History Is Significant For: Past Medical History:  Diagnosis Date  . Cystocele with rectocele   . High cholesterol   .  Hypertension   . Uterovaginal prolapse, incomplete     Her Past Surgical History Is Significant For: No past surgical history on file.  Her Family History Is Significant For: Family History  Problem Relation Age of Onset  . Diabetes Mother   . Cancer Sister   . Cancer Brother     Her Social History Is Significant For: Social History   Social History  . Marital status: Widowed    Spouse name: N/A  . Number of children: N/A  . Years of education: N/A   Social History Main Topics  . Smoking status: Never Smoker  . Smokeless tobacco: Never Used  . Alcohol use No  . Drug use: No  . Sexual activity: Not Asked   Other Topics Concern  . None   Social History Narrative  . None    Her Allergies Are:  No Known Allergies:   Her Current Medications Are:  Outpatient Encounter Prescriptions as of 04/26/2017  Medication Sig  . carvedilol (COREG) 6.25 MG tablet Take 6.25 mg by mouth 2 (two) times daily.  Marland Kitchen levothyroxine (SYNTHROID, LEVOTHROID) 75 MCG tablet Take 75 mcg by mouth daily before breakfast.  . lisinopril (PRINIVIL,ZESTRIL) 40 MG tablet Take 1 tablet (40 mg total) by mouth daily.  Marland Kitchen loratadine (CLARITIN) 10 MG tablet Take 10 mg by mouth daily.  . risperiDONE (RISPERDAL) 1 MG tablet   . simvastatin (ZOCOR) 20 MG tablet Take 20 mg by mouth at bedtime.   . [DISCONTINUED] risperiDONE (RISPERDAL) 0.25 MG tablet Take 0.5 mg by mouth at bedtime.   No facility-administered encounter medications on file as of 04/26/2017.   :  Review of Systems:  Out of a complete 14 point review of systems, all are reviewed and negative with the exception of these symptoms as listed below: Review of Systems  Neurological:       Pt presents today to follow up on her memory. Pt has met with Dr. Si Raider.    Objective:  Neurological Exam  Physical Exam Physical Examination:   Vitals:   04/26/17 1254  BP: (!) 150/72  Pulse: 69    General Examination: The patient is a very pleasant  83 y.o. female in no acute distress. She appears well-developed and well-nourished and well groomed.   HEENT: Normocephalic, atraumatic, pupils are equal, round and reactive to light and accommodation. Corrective eye glasses. Extraocular tracking is good without limitation to gaze excursion or nystagmus noted. Normal smooth pursuit is noted. Hearing is grossly intact. Face is symmetric with normal facial animation and normal facial sensation. Speech is clear with no dysarthria noted. There is no hypophonia. There is no lip, neck/head, jaw or voice tremor. Neck is supple with full range of passive and active motion. There are no carotid bruits on auscultation. Oropharynx exam reveals: moderate mouth dryness, adequate dental hygiene and mild airway crowding. Mallampati is class II. Tongue protrudes centrally and palate elevates symmetrically.   Chest: Clear to auscultation without wheezing, rhonchi or crackles noted.  Heart: S1+S2+0, regular and normal without murmurs, rubs or gallops noted.   Abdomen: Soft, non-tender and  non-distended with normal bowel sounds appreciated on auscultation.  Extremities: There is no pitting edema in the distal lower extremities bilaterally.  Skin: Warm and dry without trophic changes noted. There are no varicose veins.  Musculoskeletal: exam reveals no obvious joint deformities, tenderness or joint swelling or erythema. Scar L wrist from Fx.   Neurologically:  Mental status: The patient is awake, alert and oriented in all 4 spheres. Her immediate and remote memory, attention, language skills and fund of knowledge are fairly appropriate to mildly impaired. Mood is normal and affect is normal.   On 12/15/2016: MMSE: 29/30, CDT: 4/4, AFT: 11/min.   Cranial nerves II - XII are as described above under HEENT exam. In addition: shoulder shrug is normal with equal shoulder height noted. Motor exam: Normal bulk, strength and tone is noted. There is no drift,  tremor or rebound. Romberg is negative. Reflexes are 1+ throughout. Fine motor skills and coordination: intact with normal finger taps, normal hand movements, normal rapid alternating patting, normal foot taps and normal foot agility.  Cerebellar testing: No dysmetria or intention tremor.  Sensory exam: intact to light touch.  Gait, station and balance: She stands easily. No veering to one side is noted. No leaning to one side is noted. Posture is age-appropriate and stance is narrow based. Gait shows normal stride length and normal pace. No problems turning are noted.                Assessment and Plan:   In summary, NIKKOLE PLACZEK is a very pleasant 83 year old female with an underlying medical history of memory loss, hypertension, hyperlipidemia, who Presents for follow-up consultation of her memory loss, concern for Alzheimer's disease. The patient had recent workup. She had a recent head CT. She had an EEG on 01/07/2017 which showed normal findings in the waking state. She had a brain MRI with and without contrast on 01/09/2017 which showed Moderate atrophy and scattered white matter changes in the mild range, normal enhancement pattern. She had neuropsychological testing with Dr. Bonita Quin In October 2018 with findings supporting mild Alzheimer's dementia. She was tried on Aricept and Namenda but was taken off of these as she developed auditory hallucinations. These improved after she was treated for UTI. She was placed on Risperdal 1 mg daily at bedtime and still takes it. I am hoping that she can come off of this in the near future. We mutually agreed to try her again on low-dose Aricept 5 mg strength with gradual titration. If she is able to tolerate this in the next 2 months or so, we can try to increase this to 10 mg once daily. If she continues to do well, maybe she can talk to her primary care provider about phasing out of the Risperdal. I suggested a 2-3 month follow-up with one of our  nurse practitioners at which time we will try to increase her Aricept to 10 mg once daily if possible. She does not have any overt family history of dementia. We talked about maintaining a healthy lifestyle. She is advised to increase her exercise on a day-to-day basis and increase her water intake. She should monitor her weight, she has lost a little bit of weightin the realm of 7 pounds since I first met her in June 2018. I provided a new prescription for Aricept 5 mg strength and written instructions.I answered all their questions today and the patient and her daughter were in agreement. I spent 30 minutes in total face-to-face time  with the patient, more than 50% of which was spent in counseling and coordination of care, reviewing test results, reviewing medication and discussing or reviewing the diagnosis of dementia, its prognosis and treatment options. Pertinent laboratory and imaging test results that were available during this visit with the patient were reviewed by me and considered in my medical decision making (see chart for details).

## 2017-04-26 NOTE — Patient Instructions (Signed)
Let's try to restart the Aricept (generic name: donepezil) 5 mg: take one pill each evening. Common side effects may include dry eyes, dry mouth, confusion, low pulse, low blood pressure, also GI related side effects (nausea, vomiting, diarrhea, constipation), headaches; rare side effects may include hallucinations and seizures.   Please make a follow up appointment in about 2 to 3 months with on of our nurse practitioners and we will try to increase the Aricept to 10 mg daily at the time. Ultimately, I am hoping you can gradually reduce and come off of the Risperdal in the near future.   Please try to exercise daily and drink more water, about 3 of the 16.9 oz bottles per day.  Keep your weight stable around 140 lb.

## 2017-07-28 ENCOUNTER — Encounter: Payer: Self-pay | Admitting: Obstetrics & Gynecology

## 2017-07-28 ENCOUNTER — Ambulatory Visit (INDEPENDENT_AMBULATORY_CARE_PROVIDER_SITE_OTHER): Payer: Medicare Other | Admitting: Obstetrics & Gynecology

## 2017-07-28 VITALS — BP 140/58 | HR 86 | Ht 65.5 in | Wt 127.0 lb

## 2017-07-28 DIAGNOSIS — N812 Incomplete uterovaginal prolapse: Secondary | ICD-10-CM | POA: Diagnosis not present

## 2017-07-28 DIAGNOSIS — N814 Uterovaginal prolapse, unspecified: Secondary | ICD-10-CM | POA: Diagnosis not present

## 2017-07-28 NOTE — Progress Notes (Signed)
  HPI:      [name] is a 84 y.o. [id number] who presents today for her pessary follow up and examination related to her pelvic floor weakening.  Pt reports tolerating the pessary well with  no vaginal bleeding and  no vaginal discharge.  Symptoms of pelvic floor weakening have greatly improved. She is voiding and defecating without difficulty. She currently has a ring pessary.  PMHx: She  has a past medical history of Cystocele with rectocele, High cholesterol, Hypertension, and Uterovaginal prolapse, incomplete. Also,  has no past surgical history on file., family history includes Cancer in her brother and sister; Diabetes in her mother.,  reports that  has never smoked. she has never used smokeless tobacco. She reports that she does not drink alcohol or use drugs.  She has a current medication list which includes the following prescription(s): carvedilol, donepezil, levothyroxine, lisinopril, loratadine, risperidone, and simvastatin. Also, has No Known Allergies.  Review of Systems  All other systems reviewed and are negative.  Objective: BP (!) 140/58   Pulse 86   Ht 5' 5.5" (1.664 m)   Wt 127 lb (57.6 kg)   BMI 20.81 kg/m  Physical Exam  Constitutional: She is oriented to person, place, and time. She appears well-developed and well-nourished. No distress.  Genitourinary: Vagina normal and uterus normal. Pelvic exam was performed with patient supine. There is no rash, tenderness or lesion on the right labia. There is no rash, tenderness or lesion on the left labia. No erythema or bleeding in the vagina. Right adnexum does not display mass and does not display tenderness. Left adnexum does not display mass and does not display tenderness. Cervix does not exhibit motion tenderness, discharge, polyp or nabothian cyst.   Uterus is mobile and midaxial. Uterus is not enlarged or exhibiting a mass.  Genitourinary Comments: Gr 3 uterine prolapse and gr 2 cystocele   Abdominal: Soft. She  exhibits no distension. There is no tenderness.  Musculoskeletal: Normal range of motion.  Neurological: She is alert and oriented to person, place, and time. No cranial nerve deficit.  Skin: Skin is warm and dry.  Psychiatric: She has a normal mood and affect.   Pessary Care Pessary removed and cleaned.  Vagina checked - without erosions - pessary replaced.  A/P: Pessary was cleaned and replaced today. Instructions given for care. Concerning symptoms to observe for are counseled to patient. Follow up scheduled for 3 months. A total of 15 minutes were spent face-to-face with the patient during this encounter and over half of that time dealt with counseling and coordination of care.  Barnett Applebaum, MD, [facility], [facility] 07/28/2017  1:30 PM

## 2017-08-16 ENCOUNTER — Encounter: Payer: Self-pay | Admitting: Adult Health

## 2017-08-16 ENCOUNTER — Ambulatory Visit (INDEPENDENT_AMBULATORY_CARE_PROVIDER_SITE_OTHER): Payer: Medicare Other | Admitting: Adult Health

## 2017-08-16 VITALS — BP 140/70 | HR 75 | Ht 65.5 in | Wt 128.0 lb

## 2017-08-16 DIAGNOSIS — F0390 Unspecified dementia without behavioral disturbance: Secondary | ICD-10-CM | POA: Diagnosis not present

## 2017-08-16 NOTE — Patient Instructions (Signed)
Your Plan:  Try taking Aricept 5 mg in the morning instead of at bedtime Memory score has slightly declined since the last visit. If your symptoms worsen or you develop new symptoms please let us know.   Thank you for coming to see [place] at [facility]. I hope we have been able to provide you high quality care today.  You may receive a patient satisfaction survey over the next few weeks. We would appreciate your feedback and comments so that we may continue to improve ourselves and the health of our patients.

## 2017-08-16 NOTE — Progress Notes (Addendum)
PATIENT: TRENTON VERNE DOB: 01/02/1933  REASON FOR VISIT: follow up HISTORY FROM: patient  HISTORY OF PRESENT ILLNESS: Today 08/16/17 Ms. Milligan is an 84 year old female with history of memory disturbance.  She returns today for follow-up.  She continues to live at home alone.  She is able to complete all ADLs independently.  She is advised that simple meals otherwise her family brings her food.  Reports good appetite.  Denies any trouble sleeping.  Reports that hallucinations are better.  Her daughter reports that approximately 2 weeks ago she felt minimal in the attic asking for food.  She states that her hallucinations are very infrequent since she was started on Risperdal.  She is able to manage her finances but her family does oversee this.  She was started on low-dose Aricept at the last visit.  The patient reports that she cannot tolerate is that she felt "crazy" on the medication.  Her daughter states that she was actually having vivid dreams.  Daughter is questioning if she can take the medication during the day instead of at bedtime.  She returns today for an evaluation.  HISTORY 04/26/2017: She reports feeling okay, stable, no falls, no mood related issues, no recent behavioral issues, no hallucinations.Still takes risperidone 1 mg strength around 6 PM. She has lost a little bit of weight. She does not exercise on a regular basis, does not have access to a gym and there is no side walks around. Her other daughter lives about 10 minutes away, this daughter lives about 30 minutes away, they do check on her nearly every day. She does not always drink enough water. She does not typically drink very much at all. Likes to drink coffee in the morning and maybe 4 ounces of Pepsi per day, no milk typically, no juice.  The patient's allergies, current medications, family history, past medical history, past social history, past surgical history and problem list were reviewed and updated as  appropriate.    REVIEW OF SYSTEMS: Out of a complete 14 system review of symptoms, the patient complains only of the following symptoms, and all other reviewed systems are negative.  See HPI  ALLERGIES: No Known Allergies  HOME MEDICATIONS: Outpatient Medications Prior to Visit  Medication Sig Dispense Refill  . carvedilol (COREG) 6.25 MG tablet Take 6.25 mg by mouth 2 (two) times daily.    Marland Kitchen levothyroxine (SYNTHROID, LEVOTHROID) 75 MCG tablet Take 75 mcg by mouth daily before breakfast.    . lisinopril (PRINIVIL,ZESTRIL) 40 MG tablet Take 1 tablet (40 mg total) by mouth daily. 90 tablet 3  . loratadine (CLARITIN) 10 MG tablet Take 10 mg by mouth daily as needed.     . risperiDONE (RISPERDAL) 1 MG tablet     . simvastatin (ZOCOR) 20 MG tablet Take 20 mg by mouth at bedtime.     . donepezil (ARICEPT) 5 MG tablet Take 1 tablet (5 mg total) by mouth at bedtime. (Patient not taking: Reported on 08/16/2017) 30 tablet 5   No facility-administered medications prior to visit.     PAST MEDICAL HISTORY: Past Medical History:  Diagnosis Date  . Cystocele with rectocele   . High cholesterol   . Hypertension   . Uterovaginal prolapse, incomplete     PAST SURGICAL HISTORY: No past surgical history on file.  FAMILY HISTORY: Family History  Problem Relation Age of Onset  . Diabetes Mother   . Cancer Sister   . Cancer Brother     SOCIAL  HISTORY: Social History   Socioeconomic History  . Marital status: Widowed    Spouse name: Not on file  . Number of children: Not on file  . Years of education: Not on file  . Highest education level: Not on file  Social Needs  . Financial resource strain: Not on file  . Food insecurity - worry: Not on file  . Food insecurity - inability: Not on file  . Transportation needs - medical: Not on file  . Transportation needs - non-medical: Not on file  Occupational History  . Not on file  Tobacco Use  . Smoking status: Never Smoker  .  Smokeless tobacco: Never Used  Substance and Sexual Activity  . Alcohol use: No  . Drug use: No  . Sexual activity: Not on file  Other Topics Concern  . Not on file  Social History Narrative  . Not on file      PHYSICAL EXAM  Vitals:   08/16/17 1247  BP: 140/70  Pulse: 75  Weight: 128 lb (58.1 kg)  Height: 5' 5.5" (1.664 m)   Body mass index is 20.98 kg/m. MMSE - Mini Mental State Exam 08/16/2017 12/15/2016  Orientation to time 3 5  Orientation to Place 3 5  Registration 3 3  Attention/ Calculation 5 5  Recall 1 2  Language- name 2 objects 2 2  Language- repeat 1 1  Language- follow 3 step command 3 3  Language- read & follow direction 1 1  Write a sentence 1 1  Copy design 1 1  Total score 24 29    Generalized: Well developed, in no acute distress   Neurological examination  Mentation: Alert.Follows all commands speech and language fluent Cranial nerve II-XII: Pupils were equal round reactive to light. Extraocular movements were full, visual field were full on confrontational test. Facial sensation and strength were normal. Uvula tongue midline. Head turning and shoulder shrug  were normal and symmetric. Motor: The motor testing reveals 5 over 5 strength of all 4 extremities. Good symmetric motor tone is noted throughout.  Sensory: Sensory testing is intact to soft touch on all 4 extremities. No evidence of extinction is noted.  Coordination: Cerebellar testing reveals good finger-nose-finger and heel-to-shin bilaterally.  Gait and station: Gait is normal. Tandem gait is normal. Romberg is negative. No drift is seen.  Reflexes: Deep tendon reflexes are symmetric and normal bilaterally.   DIAGNOSTIC DATA (LABS, IMAGING, TESTING) - I reviewed patient records, labs, notes, testing and imaging myself where available.  Lab Results  Component Value Date   WBC 5.4 11/17/2016   HGB 14.1 11/17/2016   HCT 44.1 11/17/2016   MCV 96.1 11/17/2016   PLT 178 11/17/2016       Component Value Date/Time   NA 140 11/17/2016 1833   NA 132 (L) 11/06/2012 0242   K 3.5 11/17/2016 1833   K 3.7 11/06/2012 0242   CL 103 11/17/2016 1833   CL 96 (L) 11/06/2012 0242   CO2 27 11/17/2016 1833   CO2 30 11/06/2012 0242   GLUCOSE 100 (H) 11/17/2016 1833   GLUCOSE 126 (H) 11/06/2012 0242   BUN 12 11/17/2016 1833   BUN 17 11/06/2012 0242   CREATININE 0.80 11/17/2016 1833   CREATININE 0.71 11/06/2012 0242   CALCIUM 9.2 11/17/2016 1833   CALCIUM 8.4 (L) 11/06/2012 0242   PROT 6.7 11/17/2016 1833   PROT 6.7 11/05/2012 1835   ALBUMIN 4.0 11/17/2016 1833   ALBUMIN 3.3 (L) 11/05/2012 1835  AST 31 11/17/2016 1833   AST 42 (H) 11/05/2012 1835   ALT 13 (L) 11/17/2016 1833   ALT 23 11/05/2012 1835   ALKPHOS 64 11/17/2016 1833   ALKPHOS 69 11/05/2012 1835   BILITOT 1.0 11/17/2016 1833   BILITOT 0.3 11/05/2012 1835   GFRNONAA >60 11/17/2016 1833   GFRNONAA >60 11/06/2012 0242   GFRAA >60 11/17/2016 1833   GFRAA >60 11/06/2012 0242       ASSESSMENT AND PLAN 84 y.o. year old female  has a past medical history of Cystocele with rectocele, High cholesterol, Hypertension, and Uterovaginal prolapse, incomplete. here with:  1. Dementia  The patient's memory score has declined slightly since last visit.  Advised that she could try taking Aricept 5 mg in the morning instead of at bedtime.  If she continues to feel that it makes her feel odd then she can discontinue the medication.  We also discussed potentially retrying Namenda to see if she can tolerate that.  If she is unable to tolerate Aricept we will try Namenda again.  Patient and her daughter voiced understanding.  She is advised that if her symptoms worsen or she develops new symptoms they should let us know.  She will follow-up in 6 months or sooner if needed.  I spent 15 minutes with the patient. 50% of this time was spent Reviewing memory score and medication.    Ward Givens, MSN, NP-C 08/16/2017, 1:01  PM [facility] [address], [place], [place] [address] [phone]  I reviewed the above note and documentation by the Nurse Practitioner and agree with the history, physical exam, assessment and plan as outlined above. I was immediately available for face-to-face consultation. Star Age, MD, PhD [facility])

## 2017-08-24 ENCOUNTER — Other Ambulatory Visit: Payer: Self-pay

## 2017-08-24 DIAGNOSIS — F0281 Dementia in other diseases classified elsewhere with behavioral disturbance: Secondary | ICD-10-CM

## 2017-08-24 DIAGNOSIS — G301 Alzheimer's disease with late onset: Principal | ICD-10-CM

## 2017-08-24 DIAGNOSIS — F02818 Dementia in other diseases classified elsewhere, unspecified severity, with other behavioral disturbance: Secondary | ICD-10-CM

## 2017-08-24 MED ORDER — DONEPEZIL HCL 5 MG PO TABS: 5.0000 mg | ORAL_TABLET | Freq: Every day | ORAL | 0 refills | Status: DC

## 2017-11-04 ENCOUNTER — Encounter: Payer: Self-pay | Admitting: Obstetrics & Gynecology

## 2017-11-04 ENCOUNTER — Ambulatory Visit (INDEPENDENT_AMBULATORY_CARE_PROVIDER_SITE_OTHER): Payer: Medicare Other | Admitting: Obstetrics & Gynecology

## 2017-11-04 VITALS — BP 130/80 | Ht 65.0 in | Wt 124.0 lb

## 2017-11-04 DIAGNOSIS — N814 Uterovaginal prolapse, unspecified: Secondary | ICD-10-CM | POA: Diagnosis not present

## 2017-11-04 DIAGNOSIS — N812 Incomplete uterovaginal prolapse: Secondary | ICD-10-CM | POA: Diagnosis not present

## 2017-11-04 NOTE — Progress Notes (Signed)
  HPI:      [name] is a 84 y.o. [id number] who presents today for her pessary follow up and examination related to her pelvic floor weakening.  Pt reports tolerating the pessary well with  no vaginal bleeding and  no vaginal discharge.  Symptoms of pelvic floor weakening have greatly improved. She is voiding and defecating without difficulty. She currently has a #3 Ring pessary.  PMHx: She  has a past medical history of Cystocele with rectocele, High cholesterol, Hypertension, and Uterovaginal prolapse, incomplete. Also,  has no past surgical history on file., family history includes Cancer in her brother and sister; Diabetes in her mother.,  reports that she has never smoked. She has never used smokeless tobacco. She reports that she does not drink alcohol or use drugs.  She has a current medication list which includes the following prescription(s): donepezil, levothyroxine, lisinopril, loratadine, risperidone, simvastatin, and carvedilol. Also, is allergic to donepezil.  Review of Systems  Constitutional: Negative for chills, fever and malaise/fatigue.  HENT: Negative for congestion, sinus pain and sore throat.   Eyes: Negative for blurred vision and pain.  Respiratory: Negative for cough and wheezing.   Cardiovascular: Negative for chest pain and leg swelling.  Gastrointestinal: Negative for abdominal pain, constipation, diarrhea, heartburn, nausea and vomiting.  Genitourinary: Negative for dysuria, frequency, hematuria and urgency.  Musculoskeletal: Negative for back pain, joint pain, myalgias and neck pain.  Skin: Negative for itching and rash.  Neurological: Negative for dizziness, tremors and weakness.  Endo/Heme/Allergies: Does not bruise/bleed easily.  Psychiatric/Behavioral: Negative for depression. The patient is not nervous/anxious and does not have insomnia.     Objective: BP 130/80   Ht 5\' 5"  (1.651 m)   Wt 124 lb (56.2 kg)   BMI 20.63 kg/m  Physical Exam    Constitutional: She is oriented to person, place, and time. She appears well-developed and well-nourished. No distress.  Genitourinary: Vagina normal and uterus normal. Pelvic exam was performed with patient supine. There is no rash, tenderness or lesion on the right labia. There is no rash, tenderness or lesion on the left labia. No erythema or bleeding in the vagina. Right adnexum does not display mass and does not display tenderness. Left adnexum does not display mass and does not display tenderness. Cervix does not exhibit motion tenderness, discharge, polyp or nabothian cyst.   Uterus is mobile and midaxial. Uterus is not enlarged or exhibiting a mass.  Genitourinary Comments: Gr 3 uterine prolapse and gr 2 cystocele    HENT:  Head: Normocephalic and atraumatic.  Nose: Nose normal.  Mouth/Throat: Oropharynx is clear and moist.  Abdominal: Soft. She exhibits no distension. There is no tenderness.  Musculoskeletal: Normal range of motion.  Neurological: She is alert and oriented to person, place, and time. No cranial nerve deficit.  Skin: Skin is warm and dry.  Psychiatric: She has a normal mood and affect.   Pessary Care Pessary removed and cleaned.  Vagina checked - without erosions - pessary replaced.  A/P: Pessary was cleaned and replaced today. Instructions given for care. Concerning symptoms to observe for are counseled to patient. Follow up scheduled for 3 months.  A total of 15 minutes were spent face-to-face with the patient during this encounter and over half of that time dealt with counseling and coordination of care.  Barnett Applebaum, MD, [facility], [facility] 11/04/2017  2:18 PM

## 2018-02-15 ENCOUNTER — Ambulatory Visit (INDEPENDENT_AMBULATORY_CARE_PROVIDER_SITE_OTHER): Payer: Medicare Other | Admitting: Obstetrics & Gynecology

## 2018-02-15 ENCOUNTER — Encounter: Payer: Self-pay | Admitting: Obstetrics & Gynecology

## 2018-02-15 VITALS — BP 130/80 | Ht 65.5 in | Wt 118.0 lb

## 2018-02-15 DIAGNOSIS — N812 Incomplete uterovaginal prolapse: Secondary | ICD-10-CM | POA: Diagnosis not present

## 2018-02-15 DIAGNOSIS — N814 Uterovaginal prolapse, unspecified: Secondary | ICD-10-CM | POA: Diagnosis not present

## 2018-02-15 NOTE — Progress Notes (Signed)
  HPI:      [name] is a 84 y.o. [id number] who presents today for her pessary follow up and examination related to her pelvic floor weakening.  Pt reports tolerating the pessary well with  no vaginal bleeding and  no vaginal discharge.  Symptoms of pelvic floor weakening have greatly improved. She is voiding and defecating without difficulty. She currently has a #3Ring pessary.  PMHx: She  has a past medical history of Cystocele with rectocele, High cholesterol, Hypertension, and Uterovaginal prolapse, incomplete. Also,  has no past surgical history on file., family history includes Cancer in her brother and sister; Diabetes in her mother.,  reports that she has never smoked. She has never used smokeless tobacco. She reports that she does not drink alcohol or use drugs.  She has a current medication list which includes the following prescription(s): carvedilol, donepezil, levothyroxine, lisinopril, loratadine, risperidone, and simvastatin. Also, is allergic to donepezil.  Review of Systems  All other systems reviewed and are negative.   Objective: BP 130/80   Ht 5' 5.5" (1.664 m)   Wt 118 lb (53.5 kg)   BMI 19.34 kg/m  Physical Exam  Constitutional: She is oriented to person, place, and time. She appears well-developed and well-nourished. No distress.  Genitourinary: Vagina normal and uterus normal. Pelvic exam was performed with patient supine. There is no rash, tenderness or lesion on the right labia. There is no rash, tenderness or lesion on the left labia. No erythema or bleeding in the vagina. Right adnexum does not display mass and does not display tenderness. Left adnexum does not display mass and does not display tenderness. Cervix does not exhibit motion tenderness, discharge, polyp or nabothian cyst.   Uterus is mobile and midaxial. Uterus is not enlarged or exhibiting a mass.  Genitourinary Comments: Gr 3 uterine prolapse and gr 2 cystocele     HENT:  Head: Normocephalic  and atraumatic.  Nose: Nose normal.  Mouth/Throat: Oropharynx is clear and moist.  Abdominal: Soft. She exhibits no distension. There is no tenderness.  Musculoskeletal: Normal range of motion.  Neurological: She is alert and oriented to person, place, and time. No cranial nerve deficit.  Skin: Skin is warm and dry.  Psychiatric: She has a normal mood and affect.   Pessary Care Pessary removed and cleaned.  Vagina checked - without erosions - pessary replaced.  A/P:1. Uterovaginal prolapse, incomplete 2. Cystocele with small rectocele and uterine descent Pessary was cleaned and replaced today. Instructions given for care. Concerning symptoms to observe for are counseled to patient. Follow up scheduled for 3 months.  A total of 15 minutes were spent face-to-face with the patient during this encounter and over half of that time dealt with counseling and coordination of care.  Barnett Applebaum, MD, [facility], [facility] 02/15/2018  2:23 PM

## 2018-02-27 ENCOUNTER — Encounter: Payer: Self-pay | Admitting: Adult Health

## 2018-02-27 ENCOUNTER — Ambulatory Visit (INDEPENDENT_AMBULATORY_CARE_PROVIDER_SITE_OTHER): Payer: Medicare Other | Admitting: Adult Health

## 2018-02-27 VITALS — BP 146/72 | HR 78 | Ht 65.5 in | Wt 119.6 lb

## 2018-02-27 DIAGNOSIS — R413 Other amnesia: Secondary | ICD-10-CM

## 2018-02-27 NOTE — Progress Notes (Addendum)
PATIENT: Misty Carroll DOB: 1932-10-05  REASON FOR VISIT: follow up HISTORY FROM: patient  HISTORY OF PRESENT ILLNESS: Today 02/27/18:  Misty Carroll is an 84 year old female with a history of memory disturbance.  She returns today for follow-up.  She reports that she has tolerated Aricept in the morning much better.  She continues to live at home alone.  She completes all ADLs independently.  She does not operate a motor vehicle.  Denies any trouble with her sleep.  Denies hallucinations.  She reports good appetite.  Her daughter reports that someone a provide her with her dinner and she typically calls to remind her to eat breakfast and lunch.  She cannot verify if she actually eats those meals.  She returns today for evaluation.  HISTORY 08/16/17 Misty Carroll is an 84 year old female with history of memory disturbance.  She returns today for follow-up.  She continues to live at home alone.  She is able to complete all ADLs independently.  She is advised that simple meals otherwise her family brings her food.  Reports good appetite.  Denies any trouble sleeping.  Reports that hallucinations are better.  Her daughter reports that approximately 2 weeks ago she felt minimal in the attic asking for food.  She states that her hallucinations are very infrequent since she was started on Risperdal.  She is able to manage her finances but her family does oversee this.  She was started on low-dose Aricept at the last visit.  The patient reports that she cannot tolerate is that she felt "crazy" on the medication.  Her daughter states that she was actually having vivid dreams.  Daughter is questioning if she can take the medication during the day instead of at bedtime.  She returns today for an evaluation  REVIEW OF SYSTEMS: Out of a complete 14 system review of symptoms, the patient complains only of the following symptoms, and all other reviewed systems are negative.  Memory loss, cold, runny  nose  ALLERGIES: Allergies  Allergen Reactions  . Donepezil     Made her feel funny.     HOME MEDICATIONS: Outpatient Medications Prior to Visit  Medication Sig Dispense Refill  . donepezil (ARICEPT) 5 MG tablet Take 1 tablet (5 mg total) by mouth at bedtime. (Patient taking differently: Take 5 mg by mouth daily with breakfast. ) 90 tablet 0  . levothyroxine (SYNTHROID, LEVOTHROID) 75 MCG tablet Take 75 mcg by mouth daily before breakfast.    . lisinopril (PRINIVIL,ZESTRIL) 40 MG tablet Take 1 tablet (40 mg total) by mouth daily. 90 tablet 3  . loratadine (CLARITIN) 10 MG tablet Take 10 mg by mouth daily as needed.     . risperiDONE (RISPERDAL) 1 MG tablet Take 1 mg by mouth daily.     . simvastatin (ZOCOR) 20 MG tablet Take 20 mg by mouth at bedtime.     . carvedilol (COREG) 6.25 MG tablet Take 6.25 mg by mouth 2 (two) times daily.     No facility-administered medications prior to visit.     PAST MEDICAL HISTORY: Past Medical History:  Diagnosis Date  . Cystocele with rectocele   . High cholesterol   . Hypertension   . Uterovaginal prolapse, incomplete     PAST SURGICAL HISTORY: No past surgical history on file.  FAMILY HISTORY: Family History  Problem Relation Age of Onset  . Diabetes Mother   . Cancer Sister   . Cancer Brother     SOCIAL HISTORY: Social History  Socioeconomic History  . Marital status: Widowed    Spouse name: Not on file  . Number of children: Not on file  . Years of education: Not on file  . Highest education level: Not on file  Occupational History  . Not on file  Social Needs  . Financial resource strain: Not on file  . Food insecurity:    Worry: Not on file    Inability: Not on file  . Transportation needs:    Medical: Not on file    Non-medical: Not on file  Tobacco Use  . Smoking status: Never Smoker  . Smokeless tobacco: Never Used  Substance and Sexual Activity  . Alcohol use: No  . Drug use: No  . Sexual activity: Not  on file  Lifestyle  . Physical activity:    Days per week: Not on file    Minutes per session: Not on file  . Stress: Not on file  Relationships  . Social connections:    Talks on phone: Not on file    Gets together: Not on file    Attends religious service: Not on file    Active member of club or organization: Not on file    Attends meetings of clubs or organizations: Not on file    Relationship status: Not on file  . Intimate partner violence:    Fear of current or ex partner: Not on file    Emotionally abused: Not on file    Physically abused: Not on file    Forced sexual activity: Not on file  Other Topics Concern  . Not on file  Social History Narrative  . Not on file      PHYSICAL EXAM  Vitals:   02/27/18 1248  BP: (!) 146/72  Pulse: 78  Weight: 119 lb 9.6 oz (54.3 kg)  Height: 5' 5.5" (1.664 m)   Body mass index is 19.6 kg/m.   MMSE - Mini Mental State Exam 02/27/2018 08/16/2017 12/15/2016  Orientation to time 3 3 5   Orientation to Place 4 3 5   Registration 3 3 3   Attention/ Calculation 5 5 5   Recall 1 1 2   Language- name 2 objects 2 2 2   Language- repeat 1 1 1   Language- follow 3 step command 2 3 3   Language- read & follow direction 1 1 1   Write a sentence 1 1 1   Copy design 0 1 1  Total score 23 24 29      Generalized: Well developed, in no acute distress   Neurological examination  Mentation: Alert oriented to time, place, history taking. Follows all commands speech and language fluent Cranial nerve II-XII: Pupils were equal round reactive to light. Extraocular movements were full, visual field were full on confrontational test. Facial sensation and strength were normal. Uvula tongue midline. Head turning and shoulder shrug  were normal and symmetric. Motor: The motor testing reveals 5 over 5 strength of all 4 extremities. Good symmetric motor tone is noted throughout.  Sensory: Sensory testing is intact to soft touch on all 4 extremities. No  evidence of extinction is noted.  Coordination: Cerebellar testing reveals good finger-nose-finger and heel-to-shin bilaterally.  Gait and station: Gait is normal. Tandem gait is normal. Romberg is negative. No drift is seen.  Reflexes: Deep tendon reflexes are symmetric and normal bilaterally.   DIAGNOSTIC DATA (LABS, IMAGING, TESTING) - I reviewed patient records, labs, notes, testing and imaging myself where available.  Lab Results  Component Value Date   WBC 5.4  11/17/2016   HGB 14.1 11/17/2016   HCT 44.1 11/17/2016   MCV 96.1 11/17/2016   PLT 178 11/17/2016      Component Value Date/Time   NA 140 11/17/2016 1833   NA 132 (L) 11/06/2012 0242   K 3.5 11/17/2016 1833   K 3.7 11/06/2012 0242   CL 103 11/17/2016 1833   CL 96 (L) 11/06/2012 0242   CO2 27 11/17/2016 1833   CO2 30 11/06/2012 0242   GLUCOSE 100 (H) 11/17/2016 1833   GLUCOSE 126 (H) 11/06/2012 0242   BUN 12 11/17/2016 1833   BUN 17 11/06/2012 0242   CREATININE 0.80 11/17/2016 1833   CREATININE 0.71 11/06/2012 0242   CALCIUM 9.2 11/17/2016 1833   CALCIUM 8.4 (L) 11/06/2012 0242   PROT 6.7 11/17/2016 1833   PROT 6.7 11/05/2012 1835   ALBUMIN 4.0 11/17/2016 1833   ALBUMIN 3.3 (L) 11/05/2012 1835   AST 31 11/17/2016 1833   AST 42 (H) 11/05/2012 1835   ALT 13 (L) 11/17/2016 1833   ALT 23 11/05/2012 1835   ALKPHOS 64 11/17/2016 1833   ALKPHOS 69 11/05/2012 1835   BILITOT 1.0 11/17/2016 1833   BILITOT 0.3 11/05/2012 1835   GFRNONAA >60 11/17/2016 1833   GFRNONAA >60 11/06/2012 0242   GFRAA >60 11/17/2016 1833   GFRAA >60 11/06/2012 0242    Lab Results  Component Value Date   TSH 4.440 11/17/2016      ASSESSMENT AND PLAN 84 y.o. year old female  has a past medical history of Cystocele with rectocele, High cholesterol, Hypertension, and Uterovaginal prolapse, incomplete. here with :  1.  Memory disturbance  The patient's memory score has remained relatively stable.  She has lost approximately 10  pounds since the last visit.  For that reason we we will keep her on Aricept 5 mg daily.  If her weight remains stable we may be able to increase to 10 mg.  Patient and her daughter voiced understanding.  She will follow-up in 6 months or sooner if needed.   I spent 15 minutes with the patient. 50% of this time was spent reviewing memory score   Ward Givens, MSN, NP-C 02/27/2018, 1:25 PM [facility] [address], [place], [place] [address] [phone]  I reviewed the above note and documentation by the Nurse Practitioner and agree with the history, physical exam, assessment and plan as outlined above. I was immediately available for face-to-face consultation. Star Age, MD, PhD [facility])

## 2018-02-27 NOTE — Patient Instructions (Addendum)
Your Plan:  Continue Aricept 5 mg daily Monitor weight If your symptoms worsen or you develop new symptoms please let us know.   Thank you for coming to see [place] at [facility]. I hope we have been able to provide you high quality care today.  You may receive a patient satisfaction survey over the next few weeks. We would appreciate your feedback and comments so that we may continue to improve ourselves and the health of our patients.

## 2018-05-18 ENCOUNTER — Ambulatory Visit: Payer: Self-pay | Admitting: Obstetrics & Gynecology

## 2018-05-22 ENCOUNTER — Ambulatory Visit: Payer: Self-pay | Admitting: Obstetrics & Gynecology

## 2018-05-26 ENCOUNTER — Ambulatory Visit (INDEPENDENT_AMBULATORY_CARE_PROVIDER_SITE_OTHER): Payer: Medicare Other | Admitting: Obstetrics & Gynecology

## 2018-05-26 ENCOUNTER — Encounter: Payer: Self-pay | Admitting: Obstetrics & Gynecology

## 2018-05-26 VITALS — BP 140/80 | Ht 66.0 in | Wt 122.0 lb

## 2018-05-26 DIAGNOSIS — N814 Uterovaginal prolapse, unspecified: Secondary | ICD-10-CM

## 2018-05-26 DIAGNOSIS — N812 Incomplete uterovaginal prolapse: Secondary | ICD-10-CM

## 2018-05-26 NOTE — Progress Notes (Signed)
  HPI:      [name] is a 84 y.o. [id number] who presents today for her pessary follow up and examination related to her pelvic floor weakening.  Pt reports tolerating the pessary well with  no vaginal bleeding and  no vaginal discharge.  Symptoms of pelvic floor weakening have greatly improved. She is voiding and defecating without difficulty. She currently has a Ring #3 pessary.  PMHx: She  has a past medical history of Cystocele with rectocele, High cholesterol, Hypertension, and Uterovaginal prolapse, incomplete. Also,  has no past surgical history on file., family history includes Cancer in her brother and sister; Diabetes in her mother.,  reports that she has never smoked. She has never used smokeless tobacco. She reports that she does not drink alcohol or use drugs.  She has a current medication list which includes the following prescription(s): donepezil, levothyroxine, lisinopril, loratadine, risperidone, and simvastatin. Also, is allergic to donepezil.  Review of Systems  All other systems reviewed and are negative.   Objective: BP 140/80   Ht 5\' 6"  (1.676 m)   Wt 122 lb (55.3 kg)   BMI 19.69 kg/m  Physical Exam  Constitutional: She is oriented to person, place, and time. She appears well-developed and well-nourished. No distress.  Genitourinary: Vagina normal and uterus normal. Pelvic exam was performed with patient supine. There is no rash, tenderness or lesion on the right labia. There is no rash, tenderness or lesion on the left labia. No erythema or bleeding in the vagina. Right adnexum does not display mass and does not display tenderness. Left adnexum does not display mass and does not display tenderness. Cervix does not exhibit motion tenderness, discharge, polyp or nabothian cyst.   Uterus is mobile and midaxial. Uterus is not enlarged or exhibiting a mass.  Genitourinary Comments: Gr 3 uterine prolapse and gr 2 cystocele No bleeding or erosions  HENT:  Head:  Normocephalic and atraumatic.  Nose: Nose normal.  Mouth/Throat: Oropharynx is clear and moist.  Abdominal: Soft. She exhibits no distension. There is no tenderness.  Musculoskeletal: Normal range of motion.  Neurological: She is alert and oriented to person, place, and time. No cranial nerve deficit.  Skin: Skin is warm and dry.  Psychiatric: She has a normal mood and affect.    Pessary Care Pessary removed and cleaned.  Vagina checked - without erosions - pessary replaced.  A/P:1. Uterovaginal prolapse, incomplete 2. Cystocele with small rectocele and uterine descent Pessary was cleaned and replaced today. Instructions given for care. Concerning symptoms to observe for are counseled to patient. Follow up scheduled for 3 months.  A total of 15 minutes were spent face-to-face with the patient during this encounter and over half of that time dealt with counseling and coordination of care.  Barnett Applebaum, MD, [facility], [facility] 05/26/2018  11:02 AM

## 2018-06-12 ENCOUNTER — Encounter (HOSPITAL_COMMUNITY): Payer: Self-pay

## 2018-06-12 ENCOUNTER — Encounter (HOSPITAL_COMMUNITY): Payer: Self-pay | Admitting: Emergency Medicine

## 2018-06-12 ENCOUNTER — Other Ambulatory Visit: Payer: Self-pay

## 2018-06-12 ENCOUNTER — Emergency Department (HOSPITAL_COMMUNITY)
Admission: EM | Admit: 2018-06-12 | Discharge: 2018-06-12 | Disposition: A | Discharge status: Home / Self Care | Payer: Medicare Other | Source: Home / Self Care | Attending: Emergency Medicine | Admitting: Emergency Medicine

## 2018-06-12 ENCOUNTER — Inpatient Hospital Stay (HOSPITAL_COMMUNITY)
Admission: EM | Admit: 2018-06-12 | Discharge: 2018-06-16 | DRG: 956 | Disposition: A | Discharge status: Rehabilitation Facility | Payer: Medicare Other | Attending: General Surgery | Admitting: General Surgery

## 2018-06-12 ENCOUNTER — Emergency Department (HOSPITAL_COMMUNITY): Payer: Medicare Other

## 2018-06-12 DIAGNOSIS — S066X9A Traumatic subarachnoid hemorrhage with loss of consciousness of unspecified duration, initial encounter: Secondary | ICD-10-CM | POA: Diagnosis present

## 2018-06-12 DIAGNOSIS — I10 Essential (primary) hypertension: Secondary | ICD-10-CM | POA: Diagnosis present

## 2018-06-12 DIAGNOSIS — Y939 Activity, unspecified: Secondary | ICD-10-CM | POA: Insufficient documentation

## 2018-06-12 DIAGNOSIS — S065X9A Traumatic subdural hemorrhage with loss of consciousness of unspecified duration, initial encounter: Secondary | ICD-10-CM | POA: Diagnosis present

## 2018-06-12 DIAGNOSIS — Y999 Unspecified external cause status: Secondary | ICD-10-CM | POA: Insufficient documentation

## 2018-06-12 DIAGNOSIS — W1830XA Fall on same level, unspecified, initial encounter: Secondary | ICD-10-CM | POA: Insufficient documentation

## 2018-06-12 DIAGNOSIS — S065XAA Traumatic subdural hemorrhage with loss of consciousness status unknown, initial encounter: Secondary | ICD-10-CM

## 2018-06-12 DIAGNOSIS — S01112A Laceration without foreign body of left eyelid and periocular area, initial encounter: Secondary | ICD-10-CM | POA: Diagnosis present

## 2018-06-12 DIAGNOSIS — F0390 Unspecified dementia without behavioral disturbance: Secondary | ICD-10-CM | POA: Insufficient documentation

## 2018-06-12 DIAGNOSIS — S72002A Fracture of unspecified part of neck of left femur, initial encounter for closed fracture: Principal | ICD-10-CM | POA: Diagnosis present

## 2018-06-12 DIAGNOSIS — S0990XA Unspecified injury of head, initial encounter: Secondary | ICD-10-CM | POA: Insufficient documentation

## 2018-06-12 DIAGNOSIS — Z79899 Other long term (current) drug therapy: Secondary | ICD-10-CM

## 2018-06-12 DIAGNOSIS — D696 Thrombocytopenia, unspecified: Secondary | ICD-10-CM | POA: Diagnosis present

## 2018-06-12 DIAGNOSIS — E785 Hyperlipidemia, unspecified: Secondary | ICD-10-CM | POA: Diagnosis present

## 2018-06-12 DIAGNOSIS — R569 Unspecified convulsions: Secondary | ICD-10-CM | POA: Diagnosis present

## 2018-06-12 DIAGNOSIS — I609 Nontraumatic subarachnoid hemorrhage, unspecified: Secondary | ICD-10-CM

## 2018-06-12 DIAGNOSIS — Y92009 Unspecified place in unspecified non-institutional (private) residence as the place of occurrence of the external cause: Secondary | ICD-10-CM

## 2018-06-12 DIAGNOSIS — Z833 Family history of diabetes mellitus: Secondary | ICD-10-CM

## 2018-06-12 DIAGNOSIS — Z888 Allergy status to other drugs, medicaments and biological substances status: Secondary | ICD-10-CM

## 2018-06-12 DIAGNOSIS — W19XXXA Unspecified fall, initial encounter: Secondary | ICD-10-CM

## 2018-06-12 DIAGNOSIS — S0003XA Contusion of scalp, initial encounter: Secondary | ICD-10-CM | POA: Insufficient documentation

## 2018-06-12 DIAGNOSIS — Z79890 Hormone replacement therapy (postmenopausal): Secondary | ICD-10-CM

## 2018-06-12 DIAGNOSIS — G309 Alzheimer's disease, unspecified: Secondary | ICD-10-CM | POA: Diagnosis present

## 2018-06-12 DIAGNOSIS — E039 Hypothyroidism, unspecified: Secondary | ICD-10-CM | POA: Diagnosis present

## 2018-06-12 DIAGNOSIS — Z419 Encounter for procedure for purposes other than remedying health state, unspecified: Secondary | ICD-10-CM

## 2018-06-12 HISTORY — DX: Hypothyroidism, unspecified: E03.9

## 2018-06-12 HISTORY — DX: Pneumonia, unspecified organism: J18.9

## 2018-06-12 HISTORY — DX: Dementia in other diseases classified elsewhere, unspecified severity, without behavioral disturbance, psychotic disturbance, mood disturbance, and anxiety: F02.80

## 2018-06-12 HISTORY — DX: Alzheimer's disease, unspecified: G30.9

## 2018-06-12 HISTORY — DX: Unspecified malignant neoplasm of skin, unspecified: C44.90

## 2018-06-12 HISTORY — DX: Presence of urogenital implants: Z96.0

## 2018-06-12 LAB — CBC
HCT: 43.2 % (ref 36.0–46.0)
Hemoglobin: 13.5 g/dL (ref 12.0–15.0)
MCH: 31.3 pg (ref 26.0–34.0)
MCHC: 31.3 g/dL (ref 30.0–36.0)
MCV: 100.2 fL — AB (ref 80.0–100.0)
PLATELETS: 145 10*3/uL — AB (ref 150–400)
RBC: 4.31 MIL/uL (ref 3.87–5.11)
RDW: 13.4 % (ref 11.5–15.5)
WBC: 7.6 10*3/uL (ref 4.0–10.5)
nRBC: 0 % (ref 0.0–0.2)

## 2018-06-12 LAB — BASIC METABOLIC PANEL
ANION GAP: 11 (ref 5–15)
BUN: 16 mg/dL (ref 8–23)
CALCIUM: 8.9 mg/dL (ref 8.9–10.3)
CO2: 28 mmol/L (ref 22–32)
Chloride: 101 mmol/L (ref 98–111)
Creatinine, Ser: 0.91 mg/dL (ref 0.44–1.00)
GFR calc Af Amer: >60 mL/min (ref >60)
GFR calc non Af Amer: 58 mL/min — ABNORMAL LOW (ref >60)
Glucose, Bld: 114 mg/dL — ABNORMAL HIGH (ref 70–99)
Potassium: 3.4 mmol/L — ABNORMAL LOW (ref 3.5–5.1)
SODIUM: 140 mmol/L (ref 135–145)

## 2018-06-12 MED ORDER — RISPERIDONE 1 MG PO TABS
1.0000 mg | ORAL_TABLET | Freq: Once | ORAL | Status: AC
  Administered 2018-06-12: 1 mg via ORAL
  Filled 2018-06-12: qty 1

## 2018-06-12 MED ORDER — LISINOPRIL 20 MG PO TABS
40.0000 mg | ORAL_TABLET | Freq: Once | ORAL | Status: AC
  Administered 2018-06-12: 40 mg via ORAL
  Filled 2018-06-12: qty 2

## 2018-06-12 MED ORDER — ACETAMINOPHEN 500 MG PO TABS
1000.0000 mg | ORAL_TABLET | Freq: Once | ORAL | Status: AC
  Administered 2018-06-13: 1000 mg via ORAL
  Filled 2018-06-12: qty 2

## 2018-06-12 MED ORDER — SODIUM CHLORIDE 0.9 % IV SOLN: INTRAVENOUS | Status: DC

## 2018-06-12 MED ORDER — LIDOCAINE HCL (PF) 1 % IJ SOLN
5.0000 mL | Freq: Once | INTRAMUSCULAR | Status: AC
  Administered 2018-06-13: 5 mL via INTRADERMAL
  Filled 2018-06-12: qty 5

## 2018-06-12 NOTE — ED Notes (Signed)
Patient reports she "fell backwards, I think," she does not remember what she was doing, she states "I think I lost my balance or something, or passed out." Patient's daughters are at bedside, they report that PT has dementia and lives alone.

## 2018-06-12 NOTE — ED Provider Notes (Signed)
TIME SEEN: 11:42 PM  CHIEF COMPLAINT: Recurrent fall, possible seizure  HPI: Patient is an 84 year old female with history of hypertension, hyperlipidemia, hypothyroidism, dementia who presents to the emergency department after an unwitnessed fall.  Patient was seen here earlier in the day for an unwitnessed fall.  Had unremarkable labs, CT imaging at that time.  Patient discharged home and daughter reports she heard a "thud" and went upstairs to find her mother on the floor with her eyes open but unresponsive and a contracture of the right upper extremity.  Daughter was concerned she was having a seizure.  The symptoms lasted for approximately 5 minutes before mother began to answer questions slowly.  No history of seizures.  Patient denies any chest pain, difficulty breathing.  No recent fever.  No vomiting or diarrhea.  She is complaining of left leg pain.  She has not antiplatelets or anticoagulants.  Family believes her last tetanus vaccination was in the past year.  ROS: See HPI Constitutional: no fever  Eyes: no drainage  ENT: no runny nose   Cardiovascular:  no chest pain  Resp: no SOB  GI: no vomiting GU: no dysuria Integumentary: no rash  Allergy: no hives  Musculoskeletal: no leg swelling  Neurological: no slurred speech ROS otherwise negative  PAST MEDICAL HISTORY/PAST SURGICAL HISTORY:  Past Medical History:  Diagnosis Date  . Cystocele with rectocele   . High cholesterol   . Hypertension   . Uterovaginal prolapse, incomplete     MEDICATIONS:  Prior to Admission medications   Medication Sig Start Date End Date Taking? Authorizing Provider  donepezil (ARICEPT) 5 MG tablet Take 1 tablet (5 mg total) by mouth at bedtime. Patient taking differently: Take 5 mg by mouth daily with breakfast.  08/24/17   Star Age, MD  levothyroxine (SYNTHROID, LEVOTHROID) 75 MCG tablet Take 75 mcg by mouth daily before breakfast.    [provider]  lisinopril (PRINIVIL,ZESTRIL)  40 MG tablet Take 1 tablet (40 mg total) by mouth daily. 10/07/16   Robyn Haber, MD  risperiDONE (RISPERDAL) 1 MG tablet Take 1 mg by mouth daily.  01/16/17   [provider]  simvastatin (ZOCOR) 20 MG tablet Take 20 mg by mouth daily.     [provider]    ALLERGIES:  Allergies  Allergen Reactions  . Donepezil Other (See Comments)    Made her feel funny.     SOCIAL HISTORY:  Social History   Tobacco Use  . Smoking status: Never Smoker  . Smokeless tobacco: Never Used  Substance Use Topics  . Alcohol use: No    FAMILY HISTORY: Family History  Problem Relation Age of Onset  . Diabetes Mother   . Cancer Sister   . Cancer Brother     EXAM: BP (!) 157/70   Pulse 73   Temp 97.8 F (36.6 C) (Oral)   Resp 16   SpO2 97%  CONSTITUTIONAL: Alert and oriented to person and place but not time (states it is 2020) and responds appropriately to questions. Well-appearing; well-nourished; GCS 15 HEAD: Normocephalic; patient has a 3 cm laceration lateral to the left eyebrow EYES: Conjunctivae clear, PERRL, EOMI ENT: normal nose; no rhinorrhea; moist mucous membranes; pharynx without lesions noted; no dental injury; no septal hematoma, no facial tenderness on examination NECK: Supple, no meningismus, no LAD; no midline spinal tenderness, step-off or deformity; trachea midline CARD: RRR; S1 and S2 appreciated; no murmurs, no clicks, no rubs, no gallops RESP: Normal chest excursion without splinting  or tachypnea; breath sounds clear and equal bilaterally; no wheezes, no rhonchi, no rales; no hypoxia or respiratory distress CHEST:  chest wall stable, no crepitus or ecchymosis or deformity, nontender to palpation; no flail chest ABD/GI: Normal bowel sounds; non-distended; soft, non-tender, no rebound, no guarding; no ecchymosis or other lesions noted PELVIS:  stable, nontender to palpation BACK:  The back appears normal and is non-tender to palpation, there is no CVA  tenderness; no midline spinal tenderness, step-off or deformity EXT: Normal ROM in all joints; tender to palpation over the left lateral hip, left femur and left anterior tibia; no edema; normal capillary refill; no cyanosis, no bony deformity of patient's extremities, no joint effusion, compartments are soft, extremities are warm and well-perfused, no ecchymosis, 2+ radial and DP pulses bilaterally SKIN: Normal color for age and race; warm NEURO: Moves all extremities equally, reports normal sensation diffusely, cranial nerves II through XII intact, normal speech PSYCH: The patient's mood and manner are appropriate. Grooming and personal hygiene are appropriate.  MEDICAL DECISION MAKING: Patient here with recurrent fall.  Daughter was concerned that she may have had a seizure tonight.  No history of the same.  Labs ordered earlier today were unremarkable.  Will repeat basic labs to check sodium and glucose levels given possible seizure.  Also obtain EKG and troponin level.  Will also obtain CT of the head, cervical spine.  Will obtain x-rays of the left leg.  Will give Tylenol for pain.  Will repair laceration of the left face.  ED PROGRESS: Discussed with radiology, patient has a small left-sided subdural and subarachnoid hemorrhage in the sylvian fissure.  Will consult neurosurgery on-call to evaluate in the morning.  Patient will need to be admitted.  1:00 AM  D/w Dr. Trenton Gammon with neurosurgery who has reviewed patient's imaging.  Patient does not need acute intervention from neurosurgical standpoint at this time.  They will see patient in consult in the morning.   1:27 AM  D/w Dr. Leonel Ramsay with neurology as well.  He recommends starting Keppra 500 mg twice daily for possible seizure and follow-up with neurology as an outpatient.  Will give IV Keppra now.   X-rays concerning for left femoral neck fracture.  Radiology requesting further imaging with CT scan to confirm.  No leg length discrepancy  on exam but does have pain with lifting this leg off the bed and lateral pain on palpation.   2:36 AM  Pt's CT scan is concerning for left femoral neck fracture with impaction.  Will discuss with orthopedics on-call I discussed with trauma surgery for admission given multisystem traumatic injuries.   3:07 AM  D/w Dr. Alvan Dame with orthopedic surgery.  They will see patient in consult.   3:27 AM Discussed patient's case with trauma surgeon, Dr. Rosendo Gros.  I have recommended admission and patient (and family if present) agree with this plan. Admitting physician will place admission orders.   I reviewed all nursing notes, vitals, pertinent previous records, EKGs, lab and urine results, imaging (as available).    EKG Interpretation  Date/Time:  Tuesday June 13 2018 01:48:01 EST Ventricular Rate:  81 PR Interval:    QRS Duration: 113 QT Interval:  417 QTC Calculation: 485 R Axis:   82 Text Interpretation:  Sinus rhythm Incomplete right bundle branch block Artifact in lead(s) I II aVR aVL aVF No significant change since last tracing Confirmed by Pryor Curia [phone]) on 06/13/2018 1:52:39 AM         CRITICAL CARE Performed  by: Pryor Curia   Total critical care time: 45 minutes  Critical care time was exclusive of separately billable procedures and treating other patients.  Critical care was necessary to treat or prevent imminent or life-threatening deterioration.  Critical care was time spent personally by me on the following activities: development of treatment plan with patient and/or surrogate as well as nursing, discussions with consultants, evaluation of patient's response to treatment, examination of patient, obtaining history from patient or surrogate, ordering and performing treatments and interventions, ordering and review of laboratory studies, ordering and review of radiographic studies, pulse oximetry and re-evaluation of patient's condition.     Ward, Delice Bison,  DO 06/13/18 [phone]

## 2018-06-12 NOTE — ED Provider Notes (Signed)
[facility] EMERGENCY DEPARTMENT Provider Note   CSN: [id number] Arrival date & time: 06/12/18  1055     History   Chief Complaint Chief Complaint  Patient presents with  . Fall    HPI Misty Carroll is a 84 y.o. female.  Patient was not placed in the room until 15 10 in the afternoon.  At that time the clock showed that the patient had already been in the room for well over 4 hours.   Patient brought in by family members.  Unwitnessed fall patient states she lost her balance and fell backwards.  Family members are not certain whether she had any loss of consciousness or syncope.  Has had some syncope problems in the remote past.  Patient does have some dementia.  Patient with a sore area on the back of her head the only complaint is that denies any neck pain moving head around well.  Patient ambulated to the room patient denies any hip or leg pain or low back pain.  No evidence of any wounds.  Patient is not on blood thinners.     Past Medical History:  Diagnosis Date  . Cystocele with rectocele   . High cholesterol   . Hypertension   . Uterovaginal prolapse, incomplete     Patient Active Problem List   Diagnosis Date Noted  . Uterine prolapse 01/19/2017  . Cystocele, midline 01/19/2017  . Cystocele with small rectocele and uterine descent 09/03/2016  . Uterovaginal prolapse, incomplete 09/03/2016  . Left-sided epistaxis 01/25/2013    History reviewed. No pertinent surgical history.   OB History    Gravida  4   Para  4   Term  4   Preterm      AB      Living  4     SAB      TAB      Ectopic      Multiple      Live Births               Home Medications    Prior to Admission medications   Medication Sig Start Date End Date Taking? Authorizing Provider  donepezil (ARICEPT) 5 MG tablet Take 1 tablet (5 mg total) by mouth at bedtime. Patient taking differently: Take 5 mg by mouth daily with breakfast.  08/24/17  Yes Star Age, MD  levothyroxine (SYNTHROID, LEVOTHROID) 75 MCG tablet Take 75 mcg by mouth daily before breakfast.   Yes [provider]  lisinopril (PRINIVIL,ZESTRIL) 40 MG tablet Take 1 tablet (40 mg total) by mouth daily. 10/07/16  Yes Robyn Haber, MD  risperiDONE (RISPERDAL) 1 MG tablet Take 1 mg by mouth daily.  01/16/17  Yes [provider]  simvastatin (ZOCOR) 20 MG tablet Take 20 mg by mouth daily.    Yes [provider]    Family History Family History  Problem Relation Age of Onset  . Diabetes Mother   . Cancer Sister   . Cancer Brother     Social History Social History   Tobacco Use  . Smoking status: Never Smoker  . Smokeless tobacco: Never Used  Substance Use Topics  . Alcohol use: No  . Drug use: No     Allergies   Donepezil   Review of Systems Review of Systems  Unable to perform ROS: Dementia     Physical Exam Updated Vital Signs BP (!) 131/55   Pulse 63   Temp 98 F (36.7 C) (Oral)  Resp 16   SpO2 100%   Physical Exam  Constitutional: She appears well-developed and well-nourished. No distress.  HENT:  Head: Normocephalic.  Occiput area with about a 3 cm hematoma.  No abrasion no bleeding no bony step-off.  Eyes: Pupils are equal, round, and reactive to light. Conjunctivae and EOM are normal.  Neck: Neck supple.  Cardiovascular: Normal rate, regular rhythm and normal heart sounds.  Pulmonary/Chest: Effort normal and breath sounds normal.  Abdominal: Soft. Bowel sounds are normal. There is no tenderness.  Musculoskeletal: Normal range of motion. She exhibits no edema, tenderness or deformity.  Patient with good range of motion of both legs both the rotation and lifting the legs off the bed.  No concerns for pelvic or hip injury.  Patient also did walk back to the bed.  Neurological: She is alert. No cranial nerve deficit or sensory deficit. She exhibits normal muscle tone. Coordination normal.  Skin: No rash noted.    Nursing note and vitals reviewed.    ED Treatments / Results  Labs (all labs ordered are listed, but only abnormal results are displayed) Labs Reviewed  BASIC METABOLIC PANEL - Abnormal; Notable for the following components:      Result Value   Potassium 3.4 (*)    Glucose, Bld 114 (*)    GFR calc non Af Amer 58 (*)    All other components within normal limits  CBC - Abnormal; Notable for the following components:   MCV 100.2 (*)    Platelets 145 (*)    All other components within normal limits  URINALYSIS, ROUTINE W REFLEX MICROSCOPIC  CBG MONITORING, ED    EKG None  Radiology No results found.  Procedures Procedures (including critical care time)  Medications Ordered in ED Medications - No data to display   Initial Impression / Assessment and Plan / ED Course  I have reviewed the triage vital signs and the nursing notes.  Pertinent labs & imaging results that were available during my care of the patient were reviewed by me and considered in my medical decision making (see chart for details).    CT head without any acute skull or brain injury.  Labs without significant abnormalities.  Some very slight mild hypokalemia.  Family does not want to wait for the urinalysis they are not concerned.  Patient's without leukocytosis no fever no anemia.  Patient stable for discharge home and follow-up with her doctors.  Fall was unwitnessed patient does have some Alzheimer's.  But patient cardiac monitoring here is been fine.  Not able to differentiate whether there was a syncopal episode or not but patient states not.   Final Clinical Impressions(s) / ED Diagnoses   Final diagnoses:  None    ED Discharge Orders    None       Fredia Sorrow, MD 06/12/18 [phone]

## 2018-06-12 NOTE — ED Notes (Signed)
Patient verbalizes understanding of medications and discharge instructions. No further questions at this time. VSS and patient ambulatory at discharge.   

## 2018-06-12 NOTE — ED Notes (Signed)
Patient transported to X-ray 

## 2018-06-12 NOTE — ED Notes (Signed)
Patient is resting comfortably. 

## 2018-06-12 NOTE — Discharge Instructions (Addendum)
Labs and head CT without any acute findings.  Return for any new or worse symptoms.  Head CT showed no evidence of skull fracture or any brain injury.

## 2018-06-12 NOTE — ED Triage Notes (Signed)
Pt is a dementia pt who lives alone and called her daughter this morning after having a fall and hitting the back of her head.Fall was unwitnessed and on daughters arrival pt came to the door and let her in. Pt does not recall events leading to fall. Pt is alert and orineted to person and place. Pt has hematoma to back of head.

## 2018-06-12 NOTE — ED Triage Notes (Signed)
Pt here by EMS for a fall tonight while ambulating to the restroom.  Pt fell striking head on the floor resulting in a laceration to the left orbital region. PT A&Ox4 and had fallen earlier in the day.  Both were trip and falls, no neuro deficits.  Bleeding controlled with gauze.  Does also report pain to both legs.

## 2018-06-13 ENCOUNTER — Inpatient Hospital Stay (HOSPITAL_COMMUNITY): Payer: Medicare Other | Admitting: Certified Registered Nurse Anesthetist

## 2018-06-13 ENCOUNTER — Other Ambulatory Visit (HOSPITAL_COMMUNITY): Payer: Self-pay

## 2018-06-13 ENCOUNTER — Emergency Department (HOSPITAL_COMMUNITY): Payer: Medicare Other

## 2018-06-13 ENCOUNTER — Other Ambulatory Visit (HOSPITAL_COMMUNITY): Payer: 59

## 2018-06-13 ENCOUNTER — Encounter (HOSPITAL_COMMUNITY): Admission: EM | Disposition: A | Discharge status: Rehabilitation Facility | Payer: Self-pay | Source: Home / Self Care

## 2018-06-13 ENCOUNTER — Inpatient Hospital Stay (HOSPITAL_COMMUNITY): Payer: Medicare Other

## 2018-06-13 ENCOUNTER — Encounter (HOSPITAL_COMMUNITY): Payer: Self-pay | Admitting: *Deleted

## 2018-06-13 DIAGNOSIS — E039 Hypothyroidism, unspecified: Secondary | ICD-10-CM | POA: Diagnosis present

## 2018-06-13 DIAGNOSIS — Z833 Family history of diabetes mellitus: Secondary | ICD-10-CM | POA: Diagnosis not present

## 2018-06-13 DIAGNOSIS — Z809 Family history of malignant neoplasm, unspecified: Secondary | ICD-10-CM | POA: Diagnosis not present

## 2018-06-13 DIAGNOSIS — F0280 Dementia in other diseases classified elsewhere without behavioral disturbance: Secondary | ICD-10-CM | POA: Diagnosis present

## 2018-06-13 DIAGNOSIS — E876 Hypokalemia: Secondary | ICD-10-CM | POA: Diagnosis not present

## 2018-06-13 DIAGNOSIS — R569 Unspecified convulsions: Secondary | ICD-10-CM | POA: Diagnosis present

## 2018-06-13 DIAGNOSIS — G301 Alzheimer's disease with late onset: Secondary | ICD-10-CM | POA: Diagnosis not present

## 2018-06-13 DIAGNOSIS — R739 Hyperglycemia, unspecified: Secondary | ICD-10-CM | POA: Diagnosis not present

## 2018-06-13 DIAGNOSIS — G309 Alzheimer's disease, unspecified: Secondary | ICD-10-CM | POA: Diagnosis present

## 2018-06-13 DIAGNOSIS — S065X9S Traumatic subdural hemorrhage with loss of consciousness of unspecified duration, sequela: Secondary | ICD-10-CM | POA: Diagnosis present

## 2018-06-13 DIAGNOSIS — S72145S Nondisplaced intertrochanteric fracture of left femur, sequela: Secondary | ICD-10-CM | POA: Diagnosis not present

## 2018-06-13 DIAGNOSIS — S72002D Fracture of unspecified part of neck of left femur, subsequent encounter for closed fracture with routine healing: Secondary | ICD-10-CM | POA: Diagnosis not present

## 2018-06-13 DIAGNOSIS — Z79890 Hormone replacement therapy (postmenopausal): Secondary | ICD-10-CM | POA: Diagnosis not present

## 2018-06-13 DIAGNOSIS — S065X9A Traumatic subdural hemorrhage with loss of consciousness of unspecified duration, initial encounter: Secondary | ICD-10-CM | POA: Diagnosis present

## 2018-06-13 DIAGNOSIS — W1830XA Fall on same level, unspecified, initial encounter: Secondary | ICD-10-CM | POA: Diagnosis not present

## 2018-06-13 DIAGNOSIS — S065X0D Traumatic subdural hemorrhage without loss of consciousness, subsequent encounter: Secondary | ICD-10-CM | POA: Diagnosis not present

## 2018-06-13 DIAGNOSIS — R0989 Other specified symptoms and signs involving the circulatory and respiratory systems: Secondary | ICD-10-CM | POA: Diagnosis not present

## 2018-06-13 DIAGNOSIS — E871 Hypo-osmolality and hyponatremia: Secondary | ICD-10-CM | POA: Diagnosis not present

## 2018-06-13 DIAGNOSIS — S01112A Laceration without foreign body of left eyelid and periocular area, initial encounter: Secondary | ICD-10-CM | POA: Diagnosis present

## 2018-06-13 DIAGNOSIS — S72002A Fracture of unspecified part of neck of left femur, initial encounter for closed fracture: Secondary | ICD-10-CM | POA: Diagnosis present

## 2018-06-13 DIAGNOSIS — E785 Hyperlipidemia, unspecified: Secondary | ICD-10-CM | POA: Diagnosis present

## 2018-06-13 DIAGNOSIS — I10 Essential (primary) hypertension: Secondary | ICD-10-CM | POA: Diagnosis present

## 2018-06-13 DIAGNOSIS — Z9049 Acquired absence of other specified parts of digestive tract: Secondary | ICD-10-CM | POA: Diagnosis not present

## 2018-06-13 DIAGNOSIS — D62 Acute posthemorrhagic anemia: Secondary | ICD-10-CM | POA: Diagnosis not present

## 2018-06-13 DIAGNOSIS — D696 Thrombocytopenia, unspecified: Secondary | ICD-10-CM | POA: Diagnosis present

## 2018-06-13 DIAGNOSIS — I169 Hypertensive crisis, unspecified: Secondary | ICD-10-CM | POA: Diagnosis not present

## 2018-06-13 DIAGNOSIS — F0281 Dementia in other diseases classified elsewhere with behavioral disturbance: Secondary | ICD-10-CM | POA: Diagnosis not present

## 2018-06-13 DIAGNOSIS — S72002S Fracture of unspecified part of neck of left femur, sequela: Secondary | ICD-10-CM | POA: Diagnosis not present

## 2018-06-13 DIAGNOSIS — W19XXXA Unspecified fall, initial encounter: Secondary | ICD-10-CM | POA: Diagnosis present

## 2018-06-13 DIAGNOSIS — E8809 Other disorders of plasma-protein metabolism, not elsewhere classified: Secondary | ICD-10-CM | POA: Diagnosis not present

## 2018-06-13 DIAGNOSIS — E46 Unspecified protein-calorie malnutrition: Secondary | ICD-10-CM | POA: Diagnosis not present

## 2018-06-13 DIAGNOSIS — S066X9S Traumatic subarachnoid hemorrhage with loss of consciousness of unspecified duration, sequela: Secondary | ICD-10-CM | POA: Diagnosis not present

## 2018-06-13 DIAGNOSIS — Z79899 Other long term (current) drug therapy: Secondary | ICD-10-CM | POA: Diagnosis not present

## 2018-06-13 DIAGNOSIS — I951 Orthostatic hypotension: Secondary | ICD-10-CM | POA: Diagnosis not present

## 2018-06-13 DIAGNOSIS — Z85828 Personal history of other malignant neoplasm of skin: Secondary | ICD-10-CM | POA: Diagnosis not present

## 2018-06-13 DIAGNOSIS — R4701 Aphasia: Secondary | ICD-10-CM | POA: Diagnosis not present

## 2018-06-13 DIAGNOSIS — R413 Other amnesia: Secondary | ICD-10-CM | POA: Diagnosis present

## 2018-06-13 DIAGNOSIS — G479 Sleep disorder, unspecified: Secondary | ICD-10-CM | POA: Diagnosis not present

## 2018-06-13 DIAGNOSIS — G9340 Encephalopathy, unspecified: Secondary | ICD-10-CM | POA: Diagnosis not present

## 2018-06-13 DIAGNOSIS — S066X9A Traumatic subarachnoid hemorrhage with loss of consciousness of unspecified duration, initial encounter: Secondary | ICD-10-CM | POA: Diagnosis present

## 2018-06-13 DIAGNOSIS — S069X0S Unspecified intracranial injury without loss of consciousness, sequela: Secondary | ICD-10-CM | POA: Diagnosis not present

## 2018-06-13 DIAGNOSIS — R8290 Unspecified abnormal findings in urine: Secondary | ICD-10-CM | POA: Diagnosis not present

## 2018-06-13 DIAGNOSIS — K5900 Constipation, unspecified: Secondary | ICD-10-CM | POA: Diagnosis present

## 2018-06-13 DIAGNOSIS — W19XXXD Unspecified fall, subsequent encounter: Secondary | ICD-10-CM | POA: Diagnosis present

## 2018-06-13 DIAGNOSIS — Z888 Allergy status to other drugs, medicaments and biological substances status: Secondary | ICD-10-CM | POA: Diagnosis not present

## 2018-06-13 HISTORY — PX: HIP PINNING,CANNULATED: SHX1758

## 2018-06-13 LAB — I-STAT CHEM 8, ED
BUN: 19 mg/dL (ref 8–23)
Calcium, Ion: 1.09 mmol/L — ABNORMAL LOW (ref 1.15–1.40)
Chloride: 100 mmol/L (ref 98–111)
Creatinine, Ser: 0.7 mg/dL (ref 0.44–1.00)
Glucose, Bld: 143 mg/dL — ABNORMAL HIGH (ref 70–99)
HCT: 40 % (ref 36.0–46.0)
Hemoglobin: 13.6 g/dL (ref 12.0–15.0)
Potassium: 3.3 mmol/L — ABNORMAL LOW (ref 3.5–5.1)
Sodium: 139 mmol/L (ref 135–145)
TCO2: 33 mmol/L — AB (ref 22–32)

## 2018-06-13 LAB — PROTIME-INR
INR: 1.21
Prothrombin Time: 15.1 seconds (ref 11.4–15.2)

## 2018-06-13 LAB — I-STAT TROPONIN, ED: Troponin i, poc: 0 ng/mL (ref 0.00–0.08)

## 2018-06-13 LAB — APTT: aPTT: 27 seconds (ref 24–36)

## 2018-06-13 SURGERY — FIXATION, FEMUR, NECK, PERCUTANEOUS, USING SCREW
Anesthesia: General | Laterality: Left

## 2018-06-13 MED ORDER — LACTATED RINGERS IV SOLN
INTRAVENOUS | Status: DC
  Administered 2018-06-13 (×2): via INTRAVENOUS

## 2018-06-13 MED ORDER — ONDANSETRON 4 MG PO TBDP: 4.0000 mg | ORAL_TABLET | Freq: Four times a day (QID) | ORAL | Status: DC | PRN

## 2018-06-13 MED ORDER — POLYETHYLENE GLYCOL 3350 17 G PO PACK: 17.0000 g | PACK | Freq: Every day | ORAL | Status: DC | PRN

## 2018-06-13 MED ORDER — CHLORHEXIDINE GLUCONATE 4 % EX LIQD: 60.0000 mL | Freq: Once | CUTANEOUS | Status: DC

## 2018-06-13 MED ORDER — HYDROMORPHONE HCL 1 MG/ML IJ SOLN
INTRAMUSCULAR | Status: AC
  Filled 2018-06-13: qty 1

## 2018-06-13 MED ORDER — EPHEDRINE SULFATE 50 MG/ML IJ SOLN
INTRAMUSCULAR | Status: DC | PRN
  Administered 2018-06-13 (×2): 10 mg via INTRAVENOUS

## 2018-06-13 MED ORDER — DONEPEZIL HCL 5 MG PO TABS
5.0000 mg | ORAL_TABLET | Freq: Every day | ORAL | Status: DC
  Administered 2018-06-14 – 2018-06-16 (×3): 5 mg via ORAL
  Filled 2018-06-13 (×3): qty 1

## 2018-06-13 MED ORDER — CEFAZOLIN SODIUM-DEXTROSE 2-4 GM/100ML-% IV SOLN
2.0000 g | INTRAVENOUS | Status: AC
  Administered 2018-06-13: 2 g via INTRAVENOUS

## 2018-06-13 MED ORDER — LEVETIRACETAM IN NACL 500 MG/100ML IV SOLN
500.0000 mg | Freq: Once | INTRAVENOUS | Status: AC
  Administered 2018-06-13: 500 mg via INTRAVENOUS
  Filled 2018-06-13: qty 100

## 2018-06-13 MED ORDER — ONDANSETRON HCL 4 MG/2ML IJ SOLN
4.0000 mg | Freq: Once | INTRAMUSCULAR | Status: AC
  Administered 2018-06-13: 4 mg via INTRAVENOUS
  Filled 2018-06-13: qty 2

## 2018-06-13 MED ORDER — FENTANYL CITRATE (PF) 100 MCG/2ML IJ SOLN
50.0000 ug | Freq: Once | INTRAMUSCULAR | Status: AC
  Administered 2018-06-13: 50 ug via INTRAVENOUS
  Filled 2018-06-13: qty 2

## 2018-06-13 MED ORDER — ALBUMIN HUMAN 5 % IV SOLN
INTRAVENOUS | Status: DC | PRN
  Administered 2018-06-13: 17:00:00 via INTRAVENOUS

## 2018-06-13 MED ORDER — ACETAMINOPHEN 325 MG PO TABS
650.0000 mg | ORAL_TABLET | Freq: Four times a day (QID) | ORAL | Status: DC
  Administered 2018-06-14 – 2018-06-16 (×10): 650 mg via ORAL
  Filled 2018-06-13 (×11): qty 2

## 2018-06-13 MED ORDER — FENTANYL CITRATE (PF) 100 MCG/2ML IJ SOLN
25.0000 ug | INTRAMUSCULAR | Status: DC | PRN
  Administered 2018-06-13 (×2): 50 ug via INTRAVENOUS
  Administered 2018-06-13 – 2018-06-16 (×5): 25 ug via INTRAVENOUS
  Filled 2018-06-13 (×4): qty 2

## 2018-06-13 MED ORDER — DEXAMETHASONE SODIUM PHOSPHATE 10 MG/ML IJ SOLN
INTRAMUSCULAR | Status: DC | PRN
  Administered 2018-06-13: 4 mg via INTRAVENOUS

## 2018-06-13 MED ORDER — ONDANSETRON HCL 4 MG/2ML IJ SOLN
INTRAMUSCULAR | Status: DC | PRN
  Administered 2018-06-13: 4 mg via INTRAVENOUS

## 2018-06-13 MED ORDER — 0.9 % SODIUM CHLORIDE (POUR BTL) OPTIME
TOPICAL | Status: DC | PRN
  Administered 2018-06-13: 1000 mL

## 2018-06-13 MED ORDER — SUGAMMADEX SODIUM 200 MG/2ML IV SOLN
INTRAVENOUS | Status: DC | PRN
  Administered 2018-06-13: 150 mg via INTRAVENOUS

## 2018-06-13 MED ORDER — FENTANYL CITRATE (PF) 250 MCG/5ML IJ SOLN
INTRAMUSCULAR | Status: AC
  Filled 2018-06-13: qty 5

## 2018-06-13 MED ORDER — HYDRALAZINE HCL 20 MG/ML IJ SOLN: 10.0000 mg | INTRAMUSCULAR | Status: DC | PRN

## 2018-06-13 MED ORDER — EPHEDRINE SULFATE-NACL 50-0.9 MG/10ML-% IV SOSY
PREFILLED_SYRINGE | INTRAVENOUS | Status: DC | PRN
  Administered 2018-06-13 (×2): 10 mg via INTRAVENOUS

## 2018-06-13 MED ORDER — PHENYLEPHRINE HCL 10 MG/ML IJ SOLN
INTRAMUSCULAR | Status: DC | PRN
  Administered 2018-06-13 (×2): 80 ug via INTRAVENOUS

## 2018-06-13 MED ORDER — PHENYLEPHRINE 40 MCG/ML (10ML) SYRINGE FOR IV PUSH (FOR BLOOD PRESSURE SUPPORT)
PREFILLED_SYRINGE | INTRAVENOUS | Status: DC | PRN
  Administered 2018-06-13 (×4): 80 ug via INTRAVENOUS

## 2018-06-13 MED ORDER — HYDROMORPHONE HCL 1 MG/ML IJ SOLN
0.2500 mg | INTRAMUSCULAR | Status: DC | PRN
  Administered 2018-06-13: 0.5 mg via INTRAVENOUS

## 2018-06-13 MED ORDER — PROPOFOL 10 MG/ML IV BOLUS
INTRAVENOUS | Status: DC | PRN
  Administered 2018-06-13: 60 mg via INTRAVENOUS

## 2018-06-13 MED ORDER — DOCUSATE SODIUM 100 MG PO CAPS
100.0000 mg | ORAL_CAPSULE | Freq: Two times a day (BID) | ORAL | Status: DC
  Administered 2018-06-14 – 2018-06-16 (×5): 100 mg via ORAL
  Filled 2018-06-13 (×5): qty 1

## 2018-06-13 MED ORDER — MEPERIDINE HCL 50 MG/ML IJ SOLN: 6.2500 mg | INTRAMUSCULAR | Status: DC | PRN

## 2018-06-13 MED ORDER — ROCURONIUM BROMIDE 10 MG/ML (PF) SYRINGE
PREFILLED_SYRINGE | INTRAVENOUS | Status: DC | PRN
  Administered 2018-06-13: 30 mg via INTRAVENOUS

## 2018-06-13 MED ORDER — POTASSIUM CHLORIDE IN NACL 20-0.9 MEQ/L-% IV SOLN
INTRAVENOUS | Status: DC
  Administered 2018-06-13 – 2018-06-14 (×3): via INTRAVENOUS
  Filled 2018-06-13 (×3): qty 1000

## 2018-06-13 MED ORDER — POVIDONE-IODINE 10 % EX SWAB: 2.0000 "application " | Freq: Once | CUTANEOUS | Status: DC

## 2018-06-13 MED ORDER — LEVOTHYROXINE SODIUM 75 MCG PO TABS
75.0000 ug | ORAL_TABLET | Freq: Every day | ORAL | Status: DC
  Administered 2018-06-14 – 2018-06-16 (×2): 75 ug via ORAL
  Filled 2018-06-13 (×3): qty 1

## 2018-06-13 MED ORDER — CEFAZOLIN SODIUM-DEXTROSE 2-4 GM/100ML-% IV SOLN
INTRAVENOUS | Status: AC
  Filled 2018-06-13: qty 100

## 2018-06-13 MED ORDER — LEVETIRACETAM IN NACL 500 MG/100ML IV SOLN
500.0000 mg | Freq: Two times a day (BID) | INTRAVENOUS | Status: DC
  Administered 2018-06-14: 500 mg via INTRAVENOUS
  Filled 2018-06-13 (×2): qty 100

## 2018-06-13 MED ORDER — MORPHINE SULFATE (PF) 4 MG/ML IV SOLN
4.0000 mg | Freq: Once | INTRAVENOUS | Status: AC
  Administered 2018-06-13: 4 mg via INTRAVENOUS
  Filled 2018-06-13: qty 1

## 2018-06-13 MED ORDER — METHOCARBAMOL 500 MG PO TABS
500.0000 mg | ORAL_TABLET | Freq: Three times a day (TID) | ORAL | Status: DC | PRN
  Administered 2018-06-14 – 2018-06-15 (×3): 500 mg via ORAL
  Filled 2018-06-13 (×3): qty 1

## 2018-06-13 MED ORDER — ONDANSETRON HCL 4 MG/2ML IJ SOLN: 4.0000 mg | Freq: Once | INTRAMUSCULAR | Status: DC | PRN

## 2018-06-13 MED ORDER — ONDANSETRON HCL 4 MG/2ML IJ SOLN: 4.0000 mg | Freq: Four times a day (QID) | INTRAMUSCULAR | Status: DC | PRN

## 2018-06-13 SURGICAL SUPPLY — 38 items
ALCOHOL 70% 16 OZ (MISCELLANEOUS) ×3 IMPLANT
BIT DRILL 4.8X200 CANN (BIT) ×2 IMPLANT
BNDG COHESIVE 6X5 TAN STRL LF (GAUZE/BANDAGES/DRESSINGS) ×6 IMPLANT
CANISTER SUCTION WELLS/JOHNSON (MISCELLANEOUS) ×3 IMPLANT
COVER PERINEAL POST (MISCELLANEOUS) ×3 IMPLANT
COVER SURGICAL LIGHT HANDLE (MISCELLANEOUS) ×3 IMPLANT
COVER WAND RF STERILE (DRAPES) ×3 IMPLANT
DRAPE HALF SHEET 40X57 (DRAPES) IMPLANT
DRAPE INCISE IOBAN 66X45 STRL (DRAPES) ×3 IMPLANT
DRAPE STERI IOBAN 125X83 (DRAPES) ×3 IMPLANT
DRSG ADAPTIC 3X8 NADH LF (GAUZE/BANDAGES/DRESSINGS) ×3 IMPLANT
DURAPREP 26ML APPLICATOR (WOUND CARE) ×3 IMPLANT
ELECT CAUTERY BLADE 6.4 (BLADE) ×3 IMPLANT
ELECT REM PT RETURN 9FT ADLT (ELECTROSURGICAL) ×3
ELECTRODE REM PT RTRN 9FT ADLT (ELECTROSURGICAL) ×1 IMPLANT
GAUZE SPONGE 4X4 12PLY STRL (GAUZE/BANDAGES/DRESSINGS) ×2 IMPLANT
GAUZE SPONGE 4X4 12PLY STRL LF (GAUZE/BANDAGES/DRESSINGS) ×3 IMPLANT
GLOVE BIO SURGEON STRL SZ7.5 (GLOVE) ×3 IMPLANT
GLOVE BIOGEL PI IND STRL 8 (GLOVE) ×1 IMPLANT
GLOVE BIOGEL PI INDICATOR 8 (GLOVE) ×2
GOWN STRL REUS W/ TWL LRG LVL3 (GOWN DISPOSABLE) ×1 IMPLANT
GOWN STRL REUS W/ TWL XL LVL3 (GOWN DISPOSABLE) ×1 IMPLANT
GOWN STRL REUS W/TWL LRG LVL3 (GOWN DISPOSABLE) ×2
GOWN STRL REUS W/TWL XL LVL3 (GOWN DISPOSABLE) ×2
KIT BASIN OR (CUSTOM PROCEDURE TRAY) ×3 IMPLANT
KIT TURNOVER KIT B (KITS) ×3 IMPLANT
NS IRRIG 1000ML POUR BTL (IV SOLUTION) ×3 IMPLANT
PACK GENERAL/GYN (CUSTOM PROCEDURE TRAY) ×3 IMPLANT
PAD ARMBOARD 7.5X6 YLW CONV (MISCELLANEOUS) ×6 IMPLANT
PIN GUIDE DRILL TIP 2.8X300 (DRILL) ×6 IMPLANT
SCREW 16MM THREAD 6.5X75MM (Screw) ×2 IMPLANT
SCREW 8.0X80MMX16 (Screw) ×2 IMPLANT
SCREW CANN THRD 6.5X80 (Screw) ×2 IMPLANT
STAPLER VISISTAT 35W (STAPLE) ×3 IMPLANT
SUT MON AB 2-0 CT1 36 (SUTURE) ×3 IMPLANT
TOWEL OR 17X24 6PK STRL BLUE (TOWEL DISPOSABLE) ×3 IMPLANT
TOWEL OR 17X26 10 PK STRL BLUE (TOWEL DISPOSABLE) ×3 IMPLANT
WATER STERILE IRR 1000ML POUR (IV SOLUTION) ×3 IMPLANT

## 2018-06-13 NOTE — H&P (Signed)
H&P update  The surgical history has been reviewed and remains accurate without interval change.  The patient was re-examined and patient's physiologic condition has not changed significantly in the last 30 days. The condition still exists that makes this procedure necessary. The treatment plan remains the same, without new options for care.  No new pharmacological allergies or types of therapy has been initiated that would change the plan or the appropriateness of the plan.  The patient and/or family understand the potential benefits and risks.  Alexanderia Gorby P. Stann Mainland, MD 06/13/2018 4:39 PM

## 2018-06-13 NOTE — Consult Note (Signed)
Reason for Consult:Left hip fx Referring Physician: [name] is an 84 y.o. female.  HPI: Misty Carroll fell Misty couple of times yesterday. She had some seizure activity with one of the falls. She was brought to the ED where she was found to have Misty Carroll as well as Misty left femoral neck fx and orthopedic surgery was consulted. She is demented at baseline and apparently even more confused now according to dtrs in room so cannot contribute to history. She lives alone with heavy family support.  Past Medical History:  Diagnosis Date  . Cystocele with rectocele   . High cholesterol   . Hypertension   . Uterovaginal prolapse, incomplete     History reviewed. No pertinent surgical history.  Family History  Problem Relation Age of Onset  . Diabetes Mother   . Cancer Sister   . Cancer Brother     Social History:  reports that she has never smoked. She has never used smokeless tobacco. She reports that she does not drink alcohol or use drugs.  Allergies:  Allergies  Allergen Reactions  . Donepezil Other (See Comments)    Made her feel funny.     Medications: I have reviewed the patient's current medications.  Results for orders placed or performed during the hospital encounter of 06/12/18 (from the past 48 hour(s))  Protime-INR     Status: None   Collection Time: 06/13/18 12:45 AM  Result Value Ref Range   Prothrombin Time 15.1 11.4 - 15.2 seconds   INR 1.21     Comment: Performed at [facility] [address] [place], [place] [address]  APTT     Status: None   Collection Time: 06/13/18 12:45 AM  Result Value Ref Range   aPTT 27 24 - 36 seconds    Comment: Performed at [facility] [address], [place], [place] [address]  I-stat troponin, ED     Status: None   Collection Time: 06/13/18  1:01 AM  Result Value Ref Range   Troponin i, poc 0.00 0.00 - 0.08 ng/mL   Comment 3            Comment: Due to the release kinetics of cTnI, Misty negative result within the  first hours of the onset of symptoms does not rule out myocardial infarction with certainty. If myocardial infarction is still suspected, repeat the test at appropriate intervals.   I-stat chem 8, ed     Status: Abnormal   Collection Time: 06/13/18  1:03 AM  Result Value Ref Range   Sodium 139 135 - 145 mmol/L   Potassium 3.3 (L) 3.5 - 5.1 mmol/L   Chloride 100 98 - 111 mmol/L   BUN 19 8 - 23 mg/dL   Creatinine, Ser 0.70 0.44 - 1.00 mg/dL   Glucose, Bld 143 (H) 70 - 99 mg/dL   Calcium, Ion 1.09 (L) 1.15 - 1.40 mmol/L   TCO2 33 (H) 22 - 32 mmol/L   Hemoglobin 13.6 12.0 - 15.0 g/dL   HCT 40.0 36.0 - 46.0 %    Dg Tibia/fibula Left  Result Date: 06/13/2018 CLINICAL DATA:  84 year old female with fall and left lower extremity pain. EXAM: DG HIP (WITH OR WITHOUT PELVIS) 2-3V LEFT; LEFT FEMUR 2 VIEWS; LEFT TIBIA AND FIBULA - 2 VIEW COMPARISON:  None. FINDINGS: There is angulated appearance of left femoral neck concerning for Misty nondisplaced subcapital femoral neck fracture. Further evaluation with CT or MRI is recommended. There  is no other acute fracture. No dislocation. The bones are osteopenic. Moderate arthritic changes of the hips more prominent on the left. The soft tissues are unremarkable. IMPRESSION: Probable nondisplaced left femoral neck fracture. Further evaluation with CT or MRI is recommended. Electronically Signed   By: Anner Crete M.D.   On: 06/13/2018 01:05   Ct Head Wo Contrast  Result Date: 06/13/2018 CLINICAL DATA:  84 year old female with fall. EXAM: CT HEAD WITHOUT CONTRAST CT CERVICAL SPINE WITHOUT CONTRAST TECHNIQUE: Multidetector CT imaging of the head and cervical spine was performed following the standard protocol without intravenous contrast. Multiplanar CT image reconstructions of the cervical spine were also generated. COMPARISON:  Head CT dated 06/12/2018 FINDINGS: CT HEAD FINDINGS Brain: There is Misty small left frontal subdural hemorrhage, new since the prior  CT measuring up to 4 mm in thickness. Linear areas of high attenuation along the cortical surface in the left frontal lobe (series 3, image 18) may represent areas of cortical contusion although trace subarachnoid hemorrhage is not excluded. There is trace subarachnoid hemorrhage layering in the left sylvian fissure (series 3, image 14 and coronal series 5, image 41). There is moderate age-related atrophy and chronic microvascular ischemic changes. No mass effect or midline shift. Vascular: No hyperdense vessel or unexpected calcification. Skull: Normal. Negative for fracture or focal lesion. Sinuses/Orbits: No acute finding. Other: None CT CERVICAL SPINE FINDINGS Alignment: No acute subluxation. There is straightening of normal cervical lordosis which may be positional or due to muscle spasm Skull base and vertebrae: No acute fracture. Osteopenia. Soft tissues and spinal canal: No prevertebral fluid or swelling. No visible canal hematoma. Disc levels:  Multilevel degenerative changes. Anterior osteophytes. Upper chest: Biapical subpleural scarring suboptimally evaluated and only partially visualized. Other: Bilateral carotid bulb calcified plaques. IMPRESSION: 1. Small left frontal subdural hemorrhage as well as trace subarachnoid hemorrhage in the left sylvian fissure, new since the prior CT. 2. No acute/traumatic cervical spine pathology. These results were called by telephone at the time of interpretation on 06/13/2018 at 12:40 am to Dr. Pryor Curia , who verbally acknowledged these results. Electronically Signed   By: Anner Crete M.D.   On: 06/13/2018 00:43   Ct Head Wo Contrast  Result Date: 06/12/2018 CLINICAL DATA:  84 year old female post fall hitting back of head. No reported loss of consciousness. Initial encounter. EXAM: CT HEAD WITHOUT CONTRAST TECHNIQUE: Contiguous axial images were obtained from the base of the skull through the vertex without intravenous contrast. COMPARISON:  11/05/2016  CT. FINDINGS: Brain: No intracranial hemorrhage or CT evidence of large acute infarct. Chronic microvascular changes. Global atrophy. No intracranial mass lesion noted on this unenhanced exam. Vascular: Vascular calcifications Skull: No skull fracture Sinuses/Orbits: No acute orbital abnormality. Visualized paranasal sinuses are clear. Other: Mastoid air cells and middle ear cavities are clear. IMPRESSION: 1. No skull fracture or intracranial hemorrhage. 2. Chronic microvascular changes. 3. Global atrophy. Electronically Signed   By: Genia Del M.D.   On: 06/12/2018 19:13   Ct Cervical Spine Wo Contrast  Result Date: 06/13/2018 CLINICAL DATA:  84 year old female with fall. EXAM: CT HEAD WITHOUT CONTRAST CT CERVICAL SPINE WITHOUT CONTRAST TECHNIQUE: Multidetector CT imaging of the head and cervical spine was performed following the standard protocol without intravenous contrast. Multiplanar CT image reconstructions of the cervical spine were also generated. COMPARISON:  Head CT dated 06/12/2018 FINDINGS: CT HEAD FINDINGS Brain: There is Misty small left frontal subdural hemorrhage, new since the prior CT measuring up to 4 mm in  thickness. Linear areas of high attenuation along the cortical surface in the left frontal lobe (series 3, image 18) may represent areas of cortical contusion although trace subarachnoid hemorrhage is not excluded. There is trace subarachnoid hemorrhage layering in the left sylvian fissure (series 3, image 14 and coronal series 5, image 41). There is moderate age-related atrophy and chronic microvascular ischemic changes. No mass effect or midline shift. Vascular: No hyperdense vessel or unexpected calcification. Skull: Normal. Negative for fracture or focal lesion. Sinuses/Orbits: No acute finding. Other: None CT CERVICAL SPINE FINDINGS Alignment: No acute subluxation. There is straightening of normal cervical lordosis which may be positional or due to muscle spasm Skull base and  vertebrae: No acute fracture. Osteopenia. Soft tissues and spinal canal: No prevertebral fluid or swelling. No visible canal hematoma. Disc levels:  Multilevel degenerative changes. Anterior osteophytes. Upper chest: Biapical subpleural scarring suboptimally evaluated and only partially visualized. Other: Bilateral carotid bulb calcified plaques. IMPRESSION: 1. Small left frontal subdural hemorrhage as well as trace subarachnoid hemorrhage in the left sylvian fissure, new since the prior CT. 2. No acute/traumatic cervical spine pathology. These results were called by telephone at the time of interpretation on 06/13/2018 at 12:40 am to Dr. Pryor Curia , who verbally acknowledged these results. Electronically Signed   By: Anner Crete M.D.   On: 06/13/2018 00:43   Ct Hip Left Wo Contrast  Result Date: 06/13/2018 CLINICAL DATA:  84 year old female with possible left femoral neck fracture. EXAM: CT OF THE LEFT HIP WITHOUT CONTRAST TECHNIQUE: Multidetector CT imaging of the left hip was performed according to the standard protocol. Multiplanar CT image reconstructions were also generated. COMPARISON:  Earlier hip radiograph dated 06/13/2018 FINDINGS: Bones/Joint/Cartilage There is Misty nondisplaced fracture of the left femoral neck with mild impaction. The fracture is poorly visualized due to severe osteopenia. No dislocation. There is mild to moderate arthritic changes of the left hip. No joint effusion. Ligaments Suboptimally assessed by CT. Muscles and Tendons No acute findings. Soft tissues Misty pessary is noted in the pelvis. There is sigmoid diverticulosis. Atherosclerotic calcification of the vasculature. IMPRESSION: 1. Nondisplaced fracture of the left femoral neck with mild impaction. No dislocation. 2. Advanced osteopenia. Electronically Signed   By: Anner Crete M.D.   On: 06/13/2018 02:22   Dg Hip Unilat W Or Wo Pelvis 2-3 Views Left  Result Date: 06/13/2018 CLINICAL DATA:  84 year old female  with fall and left lower extremity pain. EXAM: DG HIP (WITH OR WITHOUT PELVIS) 2-3V LEFT; LEFT FEMUR 2 VIEWS; LEFT TIBIA AND FIBULA - 2 VIEW COMPARISON:  None. FINDINGS: There is angulated appearance of left femoral neck concerning for Misty nondisplaced subcapital femoral neck fracture. Further evaluation with CT or MRI is recommended. There is no other acute fracture. No dislocation. The bones are osteopenic. Moderate arthritic changes of the hips more prominent on the left. The soft tissues are unremarkable. IMPRESSION: Probable nondisplaced left femoral neck fracture. Further evaluation with CT or MRI is recommended. Electronically Signed   By: Anner Crete M.D.   On: 06/13/2018 01:05   Dg Femur Min 2 Views Left  Result Date: 06/13/2018 CLINICAL DATA:  84 year old female with fall and left lower extremity pain. EXAM: DG HIP (WITH OR WITHOUT PELVIS) 2-3V LEFT; LEFT FEMUR 2 VIEWS; LEFT TIBIA AND FIBULA - 2 VIEW COMPARISON:  None. FINDINGS: There is angulated appearance of left femoral neck concerning for Misty nondisplaced subcapital femoral neck fracture. Further evaluation with CT or MRI is recommended. There is no other  acute fracture. No dislocation. The bones are osteopenic. Moderate arthritic changes of the hips more prominent on the left. The soft tissues are unremarkable. IMPRESSION: Probable nondisplaced left femoral neck fracture. Further evaluation with CT or MRI is recommended. Electronically Signed   By: Anner Crete M.D.   On: 06/13/2018 01:05    Review of Systems  Unable to perform ROS: Dementia   Blood pressure (!) 154/64, pulse 79, temperature 97.8 F (36.6 C), temperature source Oral, resp. rate 13, SpO2 99 %. Physical Exam  Constitutional: She appears well-developed and well-nourished. No distress.  HENT:  Head: Normocephalic and atraumatic.  Eyes: Conjunctivae are normal. Right eye exhibits no discharge. Left eye exhibits no discharge. No scleral icterus.  Neck: Normal range  of motion.  Cardiovascular: Normal rate and regular rhythm.  Respiratory: Effort normal. No respiratory distress.  Musculoskeletal:  LLE No traumatic wounds, ecchymosis, or rash  Mild TTP hip  No knee or ankle effusion  Knee stable to varus/ valgus and anterior/posterior stress  Sens DPN, SPN, TN intact  Motor EHL, ext, flex, evers 5/5  DP 2+, PT 1+, No significant edema  Neurological: She is alert.  Skin: Skin is warm and dry. She is not diaphoretic.  Psychiatric: She has Misty normal mood and affect. Her behavior is normal.    Assessment/Plan: Fall Left femoral neck fx -- Likely hip pinning this afternoon by Dr. Stann Mainland. NPO until then. Carroll w/SDH, [facility] HTN HLD Hypothyroidism Dementia    Lisette Abu, PA-C Orthopedic Surgery [phone] 06/13/2018, 9:32 AM

## 2018-06-13 NOTE — Op Note (Signed)
Date of Surgery: 06/13/2018  INDICATIONS: Misty Carroll is a 84 y.o.-year-old female who sustained a hip fracture; she was indicated for open reduction and internal fixation due to the femoral neck fracture and came to the operating room today for this procedure. The patient and family did consent to the procedure after discussion of the risks and benefits.   PREOPERATIVE DIAGNOSIS: left hip fracture  POSTOPERATIVE DIAGNOSIS: Same.  PROCEDURE: Open treatment of proximal end of femur, neck with internal fixation. CPT [phone].  SURGEON: Geralynn Rile, M.D.  ASSIST: none,  ANESTHESIA: general  IV FLUIDS AND URINE: See anesthesia.  ESTIMATED BLOOD LOSS: 5 mL.  IMPLANTS: Biomet cannulated screws 8.0 x 80 mm 6.5 x 75 mm 6.5 x 80 mm  DRAINS: None.  COMPLICATIONS: None.  DESCRIPTION OF PROCEDURE: The patient was brought to the operating room and placed supine on the operating table.  The patient had been signed prior to the procedure and this was documented. The patient had the anesthesia placed by the anesthesiologist.  The prep verification and incision time-outs were performed to confirm that this was the correct patient, site, side and location. The patient had SCDs in place. The patient did receive antibiotics prior to the incision and was redosed during the procedure as needed at indicated intervals.  The patient's well leg was placed in a flexed lithotomy position and carefully padded.  The operative leg was placed in traction and also well-padded.  Care was taken to confirm radiographs before prepping and draping. The hip was prepped and draped in the standard fashion.  Screws were placed using the following technique.  The 0.062" Kirschner wire was first placed and confirmed to be in the proper location on both AP and lateral views. After the guidewire was placed, the skin incision was made over the guidewire, then the 4.5 mm cannulated drill was started over the wire.  The drill was used  to drill the bony corridor, again confirming during drilling on both views. The final cannulated screw guidewire was then placed and again confirmed in position on both views.  The measuring stick was used to measure the length of screws.  This was repeated for all screws.  The approach-withdraw phenomenon was visualized under live fluoroscopy to confirm that all screws were of the appropriate length and in the head.   Of note, during the orthopedic procedure, the bone quality was found to be very poor. All wounds were copiously irrigated with saline and then the skin was reapproximated with staples. The wounds were cleaned and dried a final time and a sterile dressing. The patient was then transferred back to the bed and left the operating room in stable condition.  All sponge and instrument counts were correct.  POSTOPERATIVE PLAN: Misty Carroll will be weight bearing as tolerated; she will return for staple removal in 2 weeks. Misty Carroll will receive DVT prophylaxis based on other medications, activity level, and risk ratio of bleeding to thrombosis per the primary team; if possible, we would prefer daily asa, 81 mg for 6 weeks.

## 2018-06-13 NOTE — ED Notes (Signed)
Op permit signed per daughter, patient states she needs to urinate however she can't bladder scaned at 630cc.

## 2018-06-13 NOTE — ED Notes (Signed)
PA at bedside for sutures

## 2018-06-13 NOTE — Consult Note (Signed)
Reason for Consult: Traumatic brain injury Referring Physician: Emergency department  Misty Carroll is an 84 y.o. female.  HPI: 84 year old female with 2 separate episodes of falling yesterday.  Patient with likely syncopal episodes and subsequent falls.  This most recent fall with a brief loss of consciousness.  Patient amnestic to the events around her fall.  She complains of some hip discomfort.  Otherwise she has no pain.  She denies any numbness paresthesias or weakness.  She is having no chest pain or shortness of breath.  Past Medical History:  Diagnosis Date  . Cystocele with rectocele   . High cholesterol   . Hypertension   . Uterovaginal prolapse, incomplete     History reviewed. No pertinent surgical history.  Family History  Problem Relation Age of Onset  . Diabetes Mother   . Cancer Sister   . Cancer Brother     Social History:  reports that she has never smoked. She has never used smokeless tobacco. She reports that she does not drink alcohol or use drugs.  Allergies:  Allergies  Allergen Reactions  . Donepezil Other (See Comments)    Made her feel funny.     Medications: I have reviewed the patient's current medications.  Results for orders placed or performed during the hospital encounter of 06/12/18 (from the past 48 hour(s))  Protime-INR     Status: None   Collection Time: 06/13/18 12:45 AM  Result Value Ref Range   Prothrombin Time 15.1 11.4 - 15.2 seconds   INR 1.21     Comment: Performed at [facility] [address], [place], [place] [address]  APTT     Status: None   Collection Time: 06/13/18 12:45 AM  Result Value Ref Range   aPTT 27 24 - 36 seconds    Comment: Performed at [facility] [address], [place], [place] [address]  I-stat troponin, ED     Status: None   Collection Time: 06/13/18  1:01 AM  Result Value Ref Range   Troponin i, poc 0.00 0.00 - 0.08 ng/mL   Comment 3            Comment: Due to the release kinetics  of cTnI, a negative result within the first hours of the onset of symptoms does not rule out myocardial infarction with certainty. If myocardial infarction is still suspected, repeat the test at appropriate intervals.   I-stat chem 8, ed     Status: Abnormal   Collection Time: 06/13/18  1:03 AM  Result Value Ref Range   Sodium 139 135 - 145 mmol/L   Potassium 3.3 (L) 3.5 - 5.1 mmol/L   Chloride 100 98 - 111 mmol/L   BUN 19 8 - 23 mg/dL   Creatinine, Ser 0.70 0.44 - 1.00 mg/dL   Glucose, Bld 143 (H) 70 - 99 mg/dL   Calcium, Ion 1.09 (L) 1.15 - 1.40 mmol/L   TCO2 33 (H) 22 - 32 mmol/L   Hemoglobin 13.6 12.0 - 15.0 g/dL   HCT 40.0 36.0 - 46.0 %    Dg Tibia/fibula Left  Result Date: 06/13/2018 CLINICAL DATA:  84 year old female with fall and left lower extremity pain. EXAM: DG HIP (WITH OR WITHOUT PELVIS) 2-3V LEFT; LEFT FEMUR 2 VIEWS; LEFT TIBIA AND FIBULA - 2 VIEW COMPARISON:  None. FINDINGS: There is angulated appearance of left femoral neck concerning for a nondisplaced subcapital femoral neck fracture. Further evaluation with CT or MRI is recommended. There is  no other acute fracture. No dislocation. The bones are osteopenic. Moderate arthritic changes of the hips more prominent on the left. The soft tissues are unremarkable. IMPRESSION: Probable nondisplaced left femoral neck fracture. Further evaluation with CT or MRI is recommended. Electronically Signed   By: Anner Crete M.D.   On: 06/13/2018 01:05   Ct Head Wo Contrast  Result Date: 06/13/2018 CLINICAL DATA:  84 year old female with fall. EXAM: CT HEAD WITHOUT CONTRAST CT CERVICAL SPINE WITHOUT CONTRAST TECHNIQUE: Multidetector CT imaging of the head and cervical spine was performed following the standard protocol without intravenous contrast. Multiplanar CT image reconstructions of the cervical spine were also generated. COMPARISON:  Head CT dated 06/12/2018 FINDINGS: CT HEAD FINDINGS Brain: There is a small left frontal  subdural hemorrhage, new since the prior CT measuring up to 4 mm in thickness. Linear areas of high attenuation along the cortical surface in the left frontal lobe (series 3, image 18) may represent areas of cortical contusion although trace subarachnoid hemorrhage is not excluded. There is trace subarachnoid hemorrhage layering in the left sylvian fissure (series 3, image 14 and coronal series 5, image 41). There is moderate age-related atrophy and chronic microvascular ischemic changes. No mass effect or midline shift. Vascular: No hyperdense vessel or unexpected calcification. Skull: Normal. Negative for fracture or focal lesion. Sinuses/Orbits: No acute finding. Other: None CT CERVICAL SPINE FINDINGS Alignment: No acute subluxation. There is straightening of normal cervical lordosis which may be positional or due to muscle spasm Skull base and vertebrae: No acute fracture. Osteopenia. Soft tissues and spinal canal: No prevertebral fluid or swelling. No visible canal hematoma. Disc levels:  Multilevel degenerative changes. Anterior osteophytes. Upper chest: Biapical subpleural scarring suboptimally evaluated and only partially visualized. Other: Bilateral carotid bulb calcified plaques. IMPRESSION: 1. Small left frontal subdural hemorrhage as well as trace subarachnoid hemorrhage in the left sylvian fissure, new since the prior CT. 2. No acute/traumatic cervical spine pathology. These results were called by telephone at the time of interpretation on 06/13/2018 at 12:40 am to Dr. Pryor Curia , who verbally acknowledged these results. Electronically Signed   By: Anner Crete M.D.   On: 06/13/2018 00:43   Ct Head Wo Contrast  Result Date: 06/12/2018 CLINICAL DATA:  84 year old female post fall hitting back of head. No reported loss of consciousness. Initial encounter. EXAM: CT HEAD WITHOUT CONTRAST TECHNIQUE: Contiguous axial images were obtained from the base of the skull through the vertex without  intravenous contrast. COMPARISON:  11/05/2016 CT. FINDINGS: Brain: No intracranial hemorrhage or CT evidence of large acute infarct. Chronic microvascular changes. Global atrophy. No intracranial mass lesion noted on this unenhanced exam. Vascular: Vascular calcifications Skull: No skull fracture Sinuses/Orbits: No acute orbital abnormality. Visualized paranasal sinuses are clear. Other: Mastoid air cells and middle ear cavities are clear. IMPRESSION: 1. No skull fracture or intracranial hemorrhage. 2. Chronic microvascular changes. 3. Global atrophy. Electronically Signed   By: Genia Del M.D.   On: 06/12/2018 19:13   Ct Cervical Spine Wo Contrast  Result Date: 06/13/2018 CLINICAL DATA:  84 year old female with fall. EXAM: CT HEAD WITHOUT CONTRAST CT CERVICAL SPINE WITHOUT CONTRAST TECHNIQUE: Multidetector CT imaging of the head and cervical spine was performed following the standard protocol without intravenous contrast. Multiplanar CT image reconstructions of the cervical spine were also generated. COMPARISON:  Head CT dated 06/12/2018 FINDINGS: CT HEAD FINDINGS Brain: There is a small left frontal subdural hemorrhage, new since the prior CT measuring up to 4 mm in thickness.  Linear areas of high attenuation along the cortical surface in the left frontal lobe (series 3, image 18) may represent areas of cortical contusion although trace subarachnoid hemorrhage is not excluded. There is trace subarachnoid hemorrhage layering in the left sylvian fissure (series 3, image 14 and coronal series 5, image 41). There is moderate age-related atrophy and chronic microvascular ischemic changes. No mass effect or midline shift. Vascular: No hyperdense vessel or unexpected calcification. Skull: Normal. Negative for fracture or focal lesion. Sinuses/Orbits: No acute finding. Other: None CT CERVICAL SPINE FINDINGS Alignment: No acute subluxation. There is straightening of normal cervical lordosis which may be positional  or due to muscle spasm Skull base and vertebrae: No acute fracture. Osteopenia. Soft tissues and spinal canal: No prevertebral fluid or swelling. No visible canal hematoma. Disc levels:  Multilevel degenerative changes. Anterior osteophytes. Upper chest: Biapical subpleural scarring suboptimally evaluated and only partially visualized. Other: Bilateral carotid bulb calcified plaques. IMPRESSION: 1. Small left frontal subdural hemorrhage as well as trace subarachnoid hemorrhage in the left sylvian fissure, new since the prior CT. 2. No acute/traumatic cervical spine pathology. These results were called by telephone at the time of interpretation on 06/13/2018 at 12:40 am to Dr. Pryor Curia , who verbally acknowledged these results. Electronically Signed   By: Anner Crete M.D.   On: 06/13/2018 00:43   Ct Hip Left Wo Contrast  Result Date: 06/13/2018 CLINICAL DATA:  84 year old female with possible left femoral neck fracture. EXAM: CT OF THE LEFT HIP WITHOUT CONTRAST TECHNIQUE: Multidetector CT imaging of the left hip was performed according to the standard protocol. Multiplanar CT image reconstructions were also generated. COMPARISON:  Earlier hip radiograph dated 06/13/2018 FINDINGS: Bones/Joint/Cartilage There is a nondisplaced fracture of the left femoral neck with mild impaction. The fracture is poorly visualized due to severe osteopenia. No dislocation. There is mild to moderate arthritic changes of the left hip. No joint effusion. Ligaments Suboptimally assessed by CT. Muscles and Tendons No acute findings. Soft tissues A pessary is noted in the pelvis. There is sigmoid diverticulosis. Atherosclerotic calcification of the vasculature. IMPRESSION: 1. Nondisplaced fracture of the left femoral neck with mild impaction. No dislocation. 2. Advanced osteopenia. Electronically Signed   By: Anner Crete M.D.   On: 06/13/2018 02:22   Dg Hip Unilat W Or Wo Pelvis 2-3 Views Left  Result Date:  06/13/2018 CLINICAL DATA:  84 year old female with fall and left lower extremity pain. EXAM: DG HIP (WITH OR WITHOUT PELVIS) 2-3V LEFT; LEFT FEMUR 2 VIEWS; LEFT TIBIA AND FIBULA - 2 VIEW COMPARISON:  None. FINDINGS: There is angulated appearance of left femoral neck concerning for a nondisplaced subcapital femoral neck fracture. Further evaluation with CT or MRI is recommended. There is no other acute fracture. No dislocation. The bones are osteopenic. Moderate arthritic changes of the hips more prominent on the left. The soft tissues are unremarkable. IMPRESSION: Probable nondisplaced left femoral neck fracture. Further evaluation with CT or MRI is recommended. Electronically Signed   By: Anner Crete M.D.   On: 06/13/2018 01:05   Dg Femur Min 2 Views Left  Result Date: 06/13/2018 CLINICAL DATA:  84 year old female with fall and left lower extremity pain. EXAM: DG HIP (WITH OR WITHOUT PELVIS) 2-3V LEFT; LEFT FEMUR 2 VIEWS; LEFT TIBIA AND FIBULA - 2 VIEW COMPARISON:  None. FINDINGS: There is angulated appearance of left femoral neck concerning for a nondisplaced subcapital femoral neck fracture. Further evaluation with CT or MRI is recommended. There is no other acute  fracture. No dislocation. The bones are osteopenic. Moderate arthritic changes of the hips more prominent on the left. The soft tissues are unremarkable. IMPRESSION: Probable nondisplaced left femoral neck fracture. Further evaluation with CT or MRI is recommended. Electronically Signed   By: Anner Crete M.D.   On: 06/13/2018 01:05    Pertinent items noted in HPI and remainder of comprehensive ROS otherwise negative. Blood pressure (!) 154/64, pulse 79, temperature 97.8 F (36.6 C), temperature source Oral, resp. rate 13, SpO2 99 %. Patient is awake and alert.  She is oriented and reasonably appropriate.  Speech is fluent.  Judgment insight are intact.  Cranial nerve function normal bilateral.  Motor and sensory function  extremities normal except her left lower extremity was incompletely evaluated secondary to hip pain.  Examination head ears eyes nose and throat demonstrates evidence of a small closed laceration around her left periorbital region.  There is no evidence of bony abnormality.  Oropharynx nasopharynx and external auditory canals are clear.  Neck is nontender.  No bony abnormality.  Full active range of motion.  Cervical thoracic and lumbar spine are generally nontender.  Assessment/Plan: Status post fall with mild traumatic brain injury.  Very small area of subdural and subarachnoid hemorrhage.  Patient should not be anticoagulated.  She is okay for upcoming hip surgery.  It is okay to use aspirin after surgery but she should not use other anticoagulation.  Needs follow-up head CT scan tomorrow  Mallie Mussel A Brylin Stopper 06/13/2018, 10:06 AM

## 2018-06-13 NOTE — Transfer of Care (Signed)
Immediate Anesthesia Transfer of Care Note  Patient: Misty Carroll  Procedure(s) Performed: CANNULATED HIP PINNING (Left )  Patient Location: PACU  Anesthesia Type:General  Level of Consciousness: drowsy and patient cooperative  Airway & Oxygen Therapy: Patient Spontanous Breathing and Patient connected to face mask oxygen  Post-op Assessment: Report given to RN and Post -op Vital signs reviewed and stable  Post vital signs: Reviewed and stable  Last Vitals:  Vitals Value Taken Time  BP 171/70 06/13/2018  6:31 PM  Temp    Pulse 67 06/13/2018  6:33 PM  Resp 15 06/13/2018  6:33 PM  SpO2 100 % 06/13/2018  6:33 PM  Vitals shown include unvalidated device data.  Last Pain:  Vitals:   06/13/18 0430  TempSrc:   PainSc: Asleep         Complications: No apparent anesthesia complications

## 2018-06-13 NOTE — H&P (Signed)
History   Misty Carroll is an 84 y.o. female.   Chief Complaint:  Chief Complaint  Patient presents with  . Fall  . Head Injury    89F w/ PMH s/f HTN, HLD, hypothyroidism.  Pt reports fall from standing.  Reported seizure activity by her daughter.  Pt denies any LOC.  Pt underwetn EDP workup and found to have  Small SDH,/SAH and L femoral neck fracture.  Pt was given Keppra per Dr. Leonel Ramsay of neurology for ?seizure activity.  Pt had lac to L eyebrow that was sutured in ED.  Trauma surgery was consulted for eval and admission.   Past Medical History:  Diagnosis Date  . Cystocele with rectocele   . High cholesterol   . Hypertension   . Uterovaginal prolapse, incomplete     History reviewed. No pertinent surgical history.  Family History  Problem Relation Age of Onset  . Diabetes Mother   . Cancer Sister   . Cancer Brother    Social History:  reports that she has never smoked. She has never used smokeless tobacco. She reports that she does not drink alcohol or use drugs.  Allergies   Allergies  Allergen Reactions  . Donepezil Other (See Comments)    Made her feel funny.     Home Medications   (Not in a hospital admission)  Trauma Course   Results for orders placed or performed during the hospital encounter of 06/12/18 (from the past 48 hour(s))  Protime-INR     Status: None   Collection Time: 06/13/18 12:45 AM  Result Value Ref Range   Prothrombin Time 15.1 11.4 - 15.2 seconds   INR 1.21     Comment: Performed at [facility] [address] [place], [place] [address]  APTT     Status: None   Collection Time: 06/13/18 12:45 AM  Result Value Ref Range   aPTT 27 24 - 36 seconds    Comment: Performed at [facility] [address], [place], [place] [address]  I-stat troponin, ED     Status: None   Collection Time: 06/13/18  1:01 AM  Result Value Ref Range   Troponin i, poc 0.00 0.00 - 0.08 ng/mL   Comment 3            Comment: Due to the  release kinetics of cTnI, a negative result within the first hours of the onset of symptoms does not rule out myocardial infarction with certainty. If myocardial infarction is still suspected, repeat the test at appropriate intervals.   I-stat chem 8, ed     Status: Abnormal   Collection Time: 06/13/18  1:03 AM  Result Value Ref Range   Sodium 139 135 - 145 mmol/L   Potassium 3.3 (L) 3.5 - 5.1 mmol/L   Chloride 100 98 - 111 mmol/L   BUN 19 8 - 23 mg/dL   Creatinine, Ser 0.70 0.44 - 1.00 mg/dL   Glucose, Bld 143 (H) 70 - 99 mg/dL   Calcium, Ion 1.09 (L) 1.15 - 1.40 mmol/L   TCO2 33 (H) 22 - 32 mmol/L   Hemoglobin 13.6 12.0 - 15.0 g/dL   HCT 40.0 36.0 - 46.0 %   Dg Tibia/fibula Left  Result Date: 06/13/2018 CLINICAL DATA:  84 year old female with fall and left lower extremity pain. EXAM: DG HIP (WITH OR WITHOUT PELVIS) 2-3V LEFT; LEFT FEMUR 2 VIEWS; LEFT TIBIA AND FIBULA - 2 VIEW COMPARISON:  None. FINDINGS: There is angulated appearance  of left femoral neck concerning for a nondisplaced subcapital femoral neck fracture. Further evaluation with CT or MRI is recommended. There is no other acute fracture. No dislocation. The bones are osteopenic. Moderate arthritic changes of the hips more prominent on the left. The soft tissues are unremarkable. IMPRESSION: Probable nondisplaced left femoral neck fracture. Further evaluation with CT or MRI is recommended. Electronically Signed   By: Anner Crete M.D.   On: 06/13/2018 01:05   Ct Head Wo Contrast  Result Date: 06/13/2018 CLINICAL DATA:  84 year old female with fall. EXAM: CT HEAD WITHOUT CONTRAST CT CERVICAL SPINE WITHOUT CONTRAST TECHNIQUE: Multidetector CT imaging of the head and cervical spine was performed following the standard protocol without intravenous contrast. Multiplanar CT image reconstructions of the cervical spine were also generated. COMPARISON:  Head CT dated 06/12/2018 FINDINGS: CT HEAD FINDINGS Brain: There is a small  left frontal subdural hemorrhage, new since the prior CT measuring up to 4 mm in thickness. Linear areas of high attenuation along the cortical surface in the left frontal lobe (series 3, image 18) may represent areas of cortical contusion although trace subarachnoid hemorrhage is not excluded. There is trace subarachnoid hemorrhage layering in the left sylvian fissure (series 3, image 14 and coronal series 5, image 41). There is moderate age-related atrophy and chronic microvascular ischemic changes. No mass effect or midline shift. Vascular: No hyperdense vessel or unexpected calcification. Skull: Normal. Negative for fracture or focal lesion. Sinuses/Orbits: No acute finding. Other: None CT CERVICAL SPINE FINDINGS Alignment: No acute subluxation. There is straightening of normal cervical lordosis which may be positional or due to muscle spasm Skull base and vertebrae: No acute fracture. Osteopenia. Soft tissues and spinal canal: No prevertebral fluid or swelling. No visible canal hematoma. Disc levels:  Multilevel degenerative changes. Anterior osteophytes. Upper chest: Biapical subpleural scarring suboptimally evaluated and only partially visualized. Other: Bilateral carotid bulb calcified plaques. IMPRESSION: 1. Small left frontal subdural hemorrhage as well as trace subarachnoid hemorrhage in the left sylvian fissure, new since the prior CT. 2. No acute/traumatic cervical spine pathology. These results were called by telephone at the time of interpretation on 06/13/2018 at 12:40 am to Dr. Pryor Curia , who verbally acknowledged these results. Electronically Signed   By: Anner Crete M.D.   On: 06/13/2018 00:43   Ct Head Wo Contrast  Result Date: 06/12/2018 CLINICAL DATA:  84 year old female post fall hitting back of head. No reported loss of consciousness. Initial encounter. EXAM: CT HEAD WITHOUT CONTRAST TECHNIQUE: Contiguous axial images were obtained from the base of the skull through the vertex  without intravenous contrast. COMPARISON:  11/05/2016 CT. FINDINGS: Brain: No intracranial hemorrhage or CT evidence of large acute infarct. Chronic microvascular changes. Global atrophy. No intracranial mass lesion noted on this unenhanced exam. Vascular: Vascular calcifications Skull: No skull fracture Sinuses/Orbits: No acute orbital abnormality. Visualized paranasal sinuses are clear. Other: Mastoid air cells and middle ear cavities are clear. IMPRESSION: 1. No skull fracture or intracranial hemorrhage. 2. Chronic microvascular changes. 3. Global atrophy. Electronically Signed   By: Genia Del M.D.   On: 06/12/2018 19:13   Ct Cervical Spine Wo Contrast  Result Date: 06/13/2018 CLINICAL DATA:  84 year old female with fall. EXAM: CT HEAD WITHOUT CONTRAST CT CERVICAL SPINE WITHOUT CONTRAST TECHNIQUE: Multidetector CT imaging of the head and cervical spine was performed following the standard protocol without intravenous contrast. Multiplanar CT image reconstructions of the cervical spine were also generated. COMPARISON:  Head CT dated 06/12/2018 FINDINGS: CT HEAD  FINDINGS Brain: There is a small left frontal subdural hemorrhage, new since the prior CT measuring up to 4 mm in thickness. Linear areas of high attenuation along the cortical surface in the left frontal lobe (series 3, image 18) may represent areas of cortical contusion although trace subarachnoid hemorrhage is not excluded. There is trace subarachnoid hemorrhage layering in the left sylvian fissure (series 3, image 14 and coronal series 5, image 41). There is moderate age-related atrophy and chronic microvascular ischemic changes. No mass effect or midline shift. Vascular: No hyperdense vessel or unexpected calcification. Skull: Normal. Negative for fracture or focal lesion. Sinuses/Orbits: No acute finding. Other: None CT CERVICAL SPINE FINDINGS Alignment: No acute subluxation. There is straightening of normal cervical lordosis which may be  positional or due to muscle spasm Skull base and vertebrae: No acute fracture. Osteopenia. Soft tissues and spinal canal: No prevertebral fluid or swelling. No visible canal hematoma. Disc levels:  Multilevel degenerative changes. Anterior osteophytes. Upper chest: Biapical subpleural scarring suboptimally evaluated and only partially visualized. Other: Bilateral carotid bulb calcified plaques. IMPRESSION: 1. Small left frontal subdural hemorrhage as well as trace subarachnoid hemorrhage in the left sylvian fissure, new since the prior CT. 2. No acute/traumatic cervical spine pathology. These results were called by telephone at the time of interpretation on 06/13/2018 at 12:40 am to Dr. Pryor Curia , who verbally acknowledged these results. Electronically Signed   By: Anner Crete M.D.   On: 06/13/2018 00:43   Ct Hip Left Wo Contrast  Result Date: 06/13/2018 CLINICAL DATA:  84 year old female with possible left femoral neck fracture. EXAM: CT OF THE LEFT HIP WITHOUT CONTRAST TECHNIQUE: Multidetector CT imaging of the left hip was performed according to the standard protocol. Multiplanar CT image reconstructions were also generated. COMPARISON:  Earlier hip radiograph dated 06/13/2018 FINDINGS: Bones/Joint/Cartilage There is a nondisplaced fracture of the left femoral neck with mild impaction. The fracture is poorly visualized due to severe osteopenia. No dislocation. There is mild to moderate arthritic changes of the left hip. No joint effusion. Ligaments Suboptimally assessed by CT. Muscles and Tendons No acute findings. Soft tissues A pessary is noted in the pelvis. There is sigmoid diverticulosis. Atherosclerotic calcification of the vasculature. IMPRESSION: 1. Nondisplaced fracture of the left femoral neck with mild impaction. No dislocation. 2. Advanced osteopenia. Electronically Signed   By: Anner Crete M.D.   On: 06/13/2018 02:22   Dg Hip Unilat W Or Wo Pelvis 2-3 Views Left  Result Date:  06/13/2018 CLINICAL DATA:  84 year old female with fall and left lower extremity pain. EXAM: DG HIP (WITH OR WITHOUT PELVIS) 2-3V LEFT; LEFT FEMUR 2 VIEWS; LEFT TIBIA AND FIBULA - 2 VIEW COMPARISON:  None. FINDINGS: There is angulated appearance of left femoral neck concerning for a nondisplaced subcapital femoral neck fracture. Further evaluation with CT or MRI is recommended. There is no other acute fracture. No dislocation. The bones are osteopenic. Moderate arthritic changes of the hips more prominent on the left. The soft tissues are unremarkable. IMPRESSION: Probable nondisplaced left femoral neck fracture. Further evaluation with CT or MRI is recommended. Electronically Signed   By: Anner Crete M.D.   On: 06/13/2018 01:05   Dg Femur Min 2 Views Left  Result Date: 06/13/2018 CLINICAL DATA:  84 year old female with fall and left lower extremity pain. EXAM: DG HIP (WITH OR WITHOUT PELVIS) 2-3V LEFT; LEFT FEMUR 2 VIEWS; LEFT TIBIA AND FIBULA - 2 VIEW COMPARISON:  None. FINDINGS: There is angulated appearance of left femoral  neck concerning for a nondisplaced subcapital femoral neck fracture. Further evaluation with CT or MRI is recommended. There is no other acute fracture. No dislocation. The bones are osteopenic. Moderate arthritic changes of the hips more prominent on the left. The soft tissues are unremarkable. IMPRESSION: Probable nondisplaced left femoral neck fracture. Further evaluation with CT or MRI is recommended. Electronically Signed   By: Anner Crete M.D.   On: 06/13/2018 01:05    Review of Systems  Constitutional: Negative for weight loss.  HENT: Negative for ear discharge, ear pain, hearing loss and tinnitus.   Eyes: Negative for blurred vision, double vision, photophobia and pain.  Respiratory: Negative for cough, sputum production and shortness of breath.   Cardiovascular: Negative for chest pain.  Gastrointestinal: Negative for abdominal pain, nausea and vomiting.    Genitourinary: Negative for dysuria, flank pain, frequency and urgency.  Musculoskeletal: Negative for back pain, falls, joint pain, myalgias and neck pain.  Neurological: Negative for dizziness, tingling, sensory change, focal weakness, loss of consciousness and headaches.  Endo/Heme/Allergies: Does not bruise/bleed easily.  Psychiatric/Behavioral: Negative for depression, memory loss and substance abuse. The patient is not nervous/anxious.     Blood pressure (!) 119/56, pulse 73, temperature 97.8 F (36.6 C), temperature source Oral, resp. rate 18, SpO2 100 %. Physical Exam  Vitals reviewed. Constitutional: She is oriented to person, place, and time. She appears well-developed and well-nourished. She is cooperative. No distress. Cervical collar and nasal cannula in place.  HENT:  Head: Normocephalic and atraumatic. Head is without raccoon's eyes, without Battle's sign, without abrasion, without contusion and without laceration.    Right Ear: Hearing, tympanic membrane, external ear and ear canal normal. No lacerations. No drainage or tenderness. No foreign bodies. Tympanic membrane is not perforated. No hemotympanum.  Left Ear: Hearing, tympanic membrane, external ear and ear canal normal. No lacerations. No drainage or tenderness. No foreign bodies. Tympanic membrane is not perforated. No hemotympanum.  Nose: Nose normal. No nose lacerations, sinus tenderness, nasal deformity or nasal septal hematoma. No epistaxis.  Mouth/Throat: Uvula is midline, oropharynx is clear and moist and mucous membranes are normal. No lacerations.  Eyes: Pupils are equal, round, and reactive to light. Conjunctivae, EOM and lids are normal. No scleral icterus.  Neck: Trachea normal. No JVD present. No spinous process tenderness and no muscular tenderness present. Carotid bruit is not present. No thyromegaly present.  Cardiovascular: Normal rate, regular rhythm, normal heart sounds, intact distal pulses and normal  pulses.  Respiratory: Effort normal and breath sounds normal. No respiratory distress. She exhibits no tenderness, no bony tenderness, no laceration and no crepitus.  GI: Soft. Normal appearance. She exhibits no distension. Bowel sounds are decreased. There is no tenderness. There is no rigidity, no rebound, no guarding and no CVA tenderness.  Musculoskeletal: Normal range of motion. She exhibits no edema or tenderness.  Lymphadenopathy:    She has no cervical adenopathy.  Neurological: She is alert and oriented to person, place, and time. She has normal strength. No cranial nerve deficit or sensory deficit. GCS eye subscore is 4. GCS verbal subscore is 5. GCS motor subscore is 6.  Skin: Skin is warm, dry and intact. She is not diaphoretic.  Psychiatric: She has a normal mood and affect. Her speech is normal and behavior is normal.     Assessment/Plan 74 F s/p Fall Small L frontal SAH/SDH L femoral neck fracture L eyebrow lac Seizure HTN HLD Hypothyroidism  1. Will admit to trauma SDU and keep  NPO for now 2. Dr. Trenton Gammon of NS[facility] to assess for TBI and f/u 3. Dr. Alvan Dame to assess femoral neck fx  4. Will place on keppra per neurology recs     Ralene Ok 06/13/2018, 4:08 AM   Procedures

## 2018-06-13 NOTE — Anesthesia Procedure Notes (Signed)
Procedure Name: Intubation Date/Time: 06/13/2018 4:55 PM Performed by: Jearld Pies, CRNA Pre-anesthesia Checklist: Patient identified, Emergency Drugs available, Suction available and Patient being monitored Patient Re-evaluated:Patient Re-evaluated prior to induction Oxygen Delivery Method: Circle System Utilized Preoxygenation: Pre-oxygenation with 100% oxygen Induction Type: IV induction Ventilation: Mask ventilation without difficulty and Oral airway inserted - appropriate to patient size Laryngoscope Size: Sabra Heck and 2 Grade View: Grade I Tube type: Oral Tube size: 7.0 mm Number of attempts: 1 Airway Equipment and Method: Stylet and Oral airway Placement Confirmation: ETT inserted through vocal cords under direct vision,  positive ETCO2 and breath sounds checked- equal and bilateral Secured at: 20 cm Tube secured with: Tape Dental Injury: Teeth and Oropharynx as per pre-operative assessment

## 2018-06-13 NOTE — ED Notes (Signed)
ED Provider at bedside. 

## 2018-06-13 NOTE — Progress Notes (Signed)
Pt is oriented to person, wants "to go home". Freq. reassurance & reorientation provided. Family were in and fully updated-aware no bed yet. They are going home & will be back tomorrow. Pt MAE

## 2018-06-13 NOTE — ED Notes (Signed)
Patient repositioned in bed, patient is confused family states she has dementia. Warm blanket given .

## 2018-06-13 NOTE — ED Provider Notes (Signed)
LACERATION REPAIR Performed by: [facility] by: Arville Lime Consent: Verbal consent obtained. Risks and benefits: risks, benefits and alternatives were discussed Consent given by: patient  Body area: left eyebrow Location details: left Laceration length: 2cm Foreign bodies: none Anesthesia: local infiltration Local anesthetic: lidocaine 1% with epinephrine Anesthetic total: 27ml Patient sedated: no Preparation: Patient was prepped and draped in the usual sterile fashion. Irrigation solution: saline Irrigation method: syringe Skin closure: simple interrupted Number of sutures: 3 Type of suture material: prolene Approximation: close Approximation difficulty: simple Patient tolerance: Patient tolerated the procedure well with no immediate complications.    Arville Lime, PA-C 06/13/18 0132    Ward, Delice Bison, DO 06/13/18 0210

## 2018-06-13 NOTE — Discharge Instructions (Signed)
Ortho d/c instructions:  Ok for weight bearing as tolerated to the left leg, with walker for 6 weeks. Maintain post op bandages on the hip until follow up appt. In 2 weeks.  You may shower with this bandage, but do not submerge under water. - apply ice to the left hip wounds for 20-30 minutes per hour when able - return to see Dr. Stann Mainland in 2 weeks for wound check.

## 2018-06-13 NOTE — Anesthesia Preprocedure Evaluation (Signed)
Anesthesia Evaluation  Patient identified by MRN, date of birth, ID band Patient confused    Reviewed: Allergy & Precautions, NPO status , Patient's Chart, lab work & pertinent test results  History of Anesthesia Complications Negative for: history of anesthetic complications  Airway Mallampati: III  TM Distance: >3 FB Neck ROM: Full    Dental  (+) Dental Advisory Given   Pulmonary neg pulmonary ROS,    breath sounds clear to auscultation       Cardiovascular hypertension, Pt. on medications  Rhythm:Regular     Neuro/Psych Dementia  negative psych ROS   GI/Hepatic negative GI ROS, Neg liver ROS,   Endo/Other  Hypothyroidism   Renal/GU      Musculoskeletal Left hip fx   Abdominal   Peds  Hematology   Anesthesia Other Findings   Reproductive/Obstetrics                             Anesthesia Physical Anesthesia Plan  ASA: III  Anesthesia Plan: General   Post-op Pain Management:    Induction: Intravenous  PONV Risk Score and Plan: 3 and Ondansetron and Dexamethasone  Airway Management Planned: Oral ETT  Additional Equipment: None  Intra-op Plan:   Post-operative Plan: Extubation in OR  Informed Consent: I have reviewed the patients History and Physical, chart, labs and discussed the procedure including the risks, benefits and alternatives for the proposed anesthesia with the patient or authorized representative who has indicated his/her understanding and acceptance.   Dental advisory given and Consent reviewed with POA  Plan Discussed with: CRNA and Surgeon  Anesthesia Plan Comments:         Anesthesia Quick Evaluation

## 2018-06-13 NOTE — Brief Op Note (Signed)
06/13/2018  5:48 PM  PATIENT:  Misty Carroll  84 y.o. female  PRE-OPERATIVE DIAGNOSIS:  Left hip fx  POST-OPERATIVE DIAGNOSIS:  Left hip fx  PROCEDURE:  Procedure(s): CANNULATED HIP PINNING (Left)  SURGEON:  Surgeon(s) and Role:    * Nicholes Stairs, MD - Primary  PHYSICIAN ASSISTANT:   ASSISTANTS: none   ANESTHESIA:   general  EBL:  5 mL   BLOOD ADMINISTERED:none  DRAINS: none   LOCAL MEDICATIONS USED:  NONE  SPECIMEN:  No Specimen  DISPOSITION OF SPECIMEN:  N/A  COUNTS:  YES  TOURNIQUET:  * No tourniquets in log *  DICTATION: .Note written in EPIC  PLAN OF CARE: Admit to inpatient   PATIENT DISPOSITION:  PACU - hemodynamically stable.   Delay start of Pharmacological VTE agent (>24hrs) due to surgical blood loss or risk of bleeding: not applicable

## 2018-06-13 NOTE — Progress Notes (Signed)
Patient ID: Misty Carroll, female   DOB: 24-Jul-1932, 84 y.o.   MRN: [id number] Admitted earlier this AM  PERL F/C MAE Lungs CTA Noted plan for OR by Dr. Stann Mainland for L hip FX Appreciate eval by Dr. Annette Stable  I spoke with her daughter  Patient examined and I agree with the assessment and plan  Georganna Skeans, MD, MPH, FACS Trauma: [phone] General Surgery: [phone]  06/13/2018 10:24 AM

## 2018-06-14 ENCOUNTER — Inpatient Hospital Stay (HOSPITAL_COMMUNITY): Payer: Medicare Other

## 2018-06-14 ENCOUNTER — Encounter (HOSPITAL_COMMUNITY): Payer: Self-pay | Admitting: General Practice

## 2018-06-14 LAB — BASIC METABOLIC PANEL
Anion gap: 10 (ref 5–15)
BUN: 8 mg/dL (ref 8–23)
CHLORIDE: 104 mmol/L (ref 98–111)
CO2: 28 mmol/L (ref 22–32)
Calcium: 8.8 mg/dL — ABNORMAL LOW (ref 8.9–10.3)
Creatinine, Ser: 0.74 mg/dL (ref 0.44–1.00)
GFR calc Af Amer: >60 mL/min (ref >60)
GFR calc non Af Amer: >60 mL/min (ref >60)
Glucose, Bld: 118 mg/dL — ABNORMAL HIGH (ref 70–99)
Potassium: 3.7 mmol/L (ref 3.5–5.1)
Sodium: 142 mmol/L (ref 135–145)

## 2018-06-14 LAB — URINALYSIS, ROUTINE W REFLEX MICROSCOPIC
Bilirubin Urine: NEGATIVE
Glucose, UA: NEGATIVE mg/dL
Ketones, ur: 5 mg/dL — AB
Nitrite: NEGATIVE
Protein, ur: 30 mg/dL — AB
RBC / HPF: >50 RBC/hpf — ABNORMAL HIGH (ref 0–5)
Specific Gravity, Urine: 1.018 (ref 1.005–1.030)
pH: 5 (ref 5.0–8.0)

## 2018-06-14 LAB — CBC
HCT: 40 % (ref 36.0–46.0)
Hemoglobin: 12.7 g/dL (ref 12.0–15.0)
MCH: 31 pg (ref 26.0–34.0)
MCHC: 31.8 g/dL (ref 30.0–36.0)
MCV: 97.6 fL (ref 80.0–100.0)
Platelets: 112 10*3/uL — ABNORMAL LOW (ref 150–400)
RBC: 4.1 MIL/uL (ref 3.87–5.11)
RDW: 13.3 % (ref 11.5–15.5)
WBC: 6.8 10*3/uL (ref 4.0–10.5)
nRBC: 0 % (ref 0.0–0.2)

## 2018-06-14 MED ORDER — ONDANSETRON HCL 4 MG PO TABS: 4.0000 mg | ORAL_TABLET | Freq: Four times a day (QID) | ORAL | Status: DC | PRN

## 2018-06-14 MED ORDER — DOCUSATE SODIUM 100 MG PO CAPS: 100.0000 mg | ORAL_CAPSULE | Freq: Two times a day (BID) | ORAL | Status: DC

## 2018-06-14 MED ORDER — LEVETIRACETAM 500 MG PO TABS
500.0000 mg | ORAL_TABLET | Freq: Once | ORAL | Status: AC
  Administered 2018-06-14: 500 mg via ORAL

## 2018-06-14 MED ORDER — SODIUM CHLORIDE 0.9 % IV SOLN: 250.0000 mL | INTRAVENOUS | Status: DC | PRN

## 2018-06-14 MED ORDER — SODIUM CHLORIDE 0.9% FLUSH
3.0000 mL | Freq: Two times a day (BID) | INTRAVENOUS | Status: DC
  Administered 2018-06-15 – 2018-06-16 (×4): 3 mL via INTRAVENOUS

## 2018-06-14 MED ORDER — SODIUM CHLORIDE 0.9% FLUSH: 3.0000 mL | INTRAVENOUS | Status: DC | PRN

## 2018-06-14 MED ORDER — LEVETIRACETAM 500 MG PO TABS
500.0000 mg | ORAL_TABLET | Freq: Two times a day (BID) | ORAL | Status: DC
  Administered 2018-06-15 – 2018-06-16 (×3): 500 mg via ORAL
  Filled 2018-06-14 (×3): qty 1

## 2018-06-14 MED ORDER — INFLUENZA VAC SPLIT HIGH-DOSE 0.5 ML IM SUSY
0.5000 mL | PREFILLED_SYRINGE | INTRAMUSCULAR | Status: DC
  Filled 2018-06-14: qty 0.5

## 2018-06-14 MED ORDER — METOCLOPRAMIDE HCL 5 MG/ML IJ SOLN: 5.0000 mg | Freq: Three times a day (TID) | INTRAMUSCULAR | Status: DC | PRN

## 2018-06-14 MED ORDER — METOCLOPRAMIDE HCL 10 MG PO TABS: 5.0000 mg | ORAL_TABLET | Freq: Three times a day (TID) | ORAL | Status: DC | PRN

## 2018-06-14 MED ORDER — ONDANSETRON HCL 4 MG/2ML IJ SOLN: 4.0000 mg | Freq: Four times a day (QID) | INTRAMUSCULAR | Status: DC | PRN

## 2018-06-14 MED ORDER — LEVETIRACETAM 500 MG PO TABS
500.0000 mg | ORAL_TABLET | Freq: Two times a day (BID) | ORAL | Status: DC
  Filled 2018-06-14: qty 1

## 2018-06-14 NOTE — Progress Notes (Signed)
Overall stable.  No events or problems overnight.  Patient tolerated hip surgery without difficulty.  Afebrile.  Vital signs are stable.  Patient wide awake and interactive.  She is mildly confused.  She has some word choice issues.  Motor and sensory function of her extremities normal.  Follow-up head CT scan demonstrates evolution of a left anterior temporal lobe contusion.  The radiologist raised the remote possibility of vascular abnormality which I think is highly unlikely and inconsistent with her history and her current imaging findings as there is no evidence of significant pressure from this hemorrhage.  I recommend observational care.  Patient may be mobilized ad lib..  Patient will need follow-up head CT scan in 72 hours.

## 2018-06-14 NOTE — Progress Notes (Addendum)
[facility] Progress Note  1 Day Post-Op  Subjective: CC:  Patient is pleasantly confused, denies pain. Daughter at bedside.   Objective: Vital signs in last 24 hours: Temp:  [97.2 F (36.2 C)-97.8 F (36.6 C)] 97.8 F (36.6 C) (12/11 0838) Pulse Rate:  [61-91] 72 (12/11 0838) Resp:  [10-26] 20 (12/11 0838) BP: (111-173)/(49-100) 151/66 (12/11 0838) SpO2:  [86 %-100 %] 94 % (12/11 0838) Weight:  [55 kg] 55 kg (12/10 1554)    Intake/Output from previous day: 12/10 0701 - 12/11 0700 In: 2488.9 [I.V.:2138.5; IV Piggyback:350.4] Out: 1660 [BBCWU:8891; Blood:85] Intake/Output this shift: No intake/output data recorded.  PE: Gen:  Alert, NAD HEENT: PERRL, EOMs in tact, anicteric sclarae Card:  Regular rate and rhythm, pedal pulses 2+ BL Pulm:  Normal effort, clear to auscultation bilaterally Abd: Soft, non-tender, non-distended, bowel sounds present GU: foley in place  Skin: warm and dry, no rashes  Psych: A&Ox3  Neuro: oriented to self and [place], MAE, following commands   Lab Results:  Recent Labs    06/12/18 1115 06/13/18 0103 06/14/18 0311  WBC 7.6  --  6.8  HGB 13.5 13.6 12.7  HCT 43.2 40.0 40.0  PLT 145*  --  112*   BMET Recent Labs    06/12/18 1115 06/13/18 0103 06/14/18 0311  NA 140 139 142  K 3.4* 3.3* 3.7  CL 101 100 104  CO2 28  --  28  GLUCOSE 114* 143* 118*  BUN 16 19 8   CREATININE 0.91 0.70 0.74  CALCIUM 8.9  --  8.8*   PT/INR Recent Labs    06/13/18 0045  LABPROT 15.1  INR 1.21   CMP     Component Value Date/Time   NA 142 06/14/2018 0311   NA 132 (L) 11/06/2012 0242   K 3.7 06/14/2018 0311   K 3.7 11/06/2012 0242   CL 104 06/14/2018 0311   CL 96 (L) 11/06/2012 0242   CO2 28 06/14/2018 0311   CO2 30 11/06/2012 0242   GLUCOSE 118 (H) 06/14/2018 0311   GLUCOSE 126 (H) 11/06/2012 0242   BUN 8 06/14/2018 0311   BUN 17 11/06/2012 0242   CREATININE 0.74 06/14/2018 0311   CREATININE 0.71 11/06/2012 0242    CALCIUM 8.8 (L) 06/14/2018 0311   CALCIUM 8.4 (L) 11/06/2012 0242   PROT 6.7 11/17/2016 1833   PROT 6.7 11/05/2012 1835   ALBUMIN 4.0 11/17/2016 1833   ALBUMIN 3.3 (L) 11/05/2012 1835   AST 31 11/17/2016 1833   AST 42 (H) 11/05/2012 1835   ALT 13 (L) 11/17/2016 1833   ALT 23 11/05/2012 1835   ALKPHOS 64 11/17/2016 1833   ALKPHOS 69 11/05/2012 1835   BILITOT 1.0 11/17/2016 1833   BILITOT 0.3 11/05/2012 1835   GFRNONAA >60 06/14/2018 0311   GFRNONAA >60 11/06/2012 0242   GFRAA >60 06/14/2018 0311   GFRAA >60 11/06/2012 0242   Lipase  No results found for: LIPASE     Studies/Results: Dg Tibia/fibula Left  Result Date: 06/13/2018 CLINICAL DATA:  84 year old female with fall and left lower extremity pain. EXAM: DG HIP (WITH OR WITHOUT PELVIS) 2-3V LEFT; LEFT FEMUR 2 VIEWS; LEFT TIBIA AND FIBULA - 2 VIEW COMPARISON:  None. FINDINGS: There is angulated appearance of left femoral neck concerning for a nondisplaced subcapital femoral neck fracture. Further evaluation with CT or MRI is recommended. There is no other acute fracture. No dislocation. The bones are osteopenic. Moderate arthritic changes of the hips more prominent on the  left. The soft tissues are unremarkable. IMPRESSION: Probable nondisplaced left femoral neck fracture. Further evaluation with CT or MRI is recommended. Electronically Signed   By: Anner Crete M.D.   On: 06/13/2018 01:05   Ct Head Wo Contrast  Result Date: 06/14/2018 CLINICAL DATA:  84 year old female with head trauma. Follow-up previously seen intracranial hemorrhage on 06/13/2018 EXAM: CT HEAD WITHOUT CONTRAST TECHNIQUE: Contiguous axial images were obtained from the base of the skull through the vertex without intravenous contrast. COMPARISON:  Head CT dated 06/13/2018 FINDINGS: Brain: Significant interval increase in the subarachnoid hemorrhage within the left sylvian fissure and interval extension of subarachnoid blood into the left parietal and left  occipital lobes. There is interval development of intraparenchymal hemorrhage in the left temporal lobe with the intraparenchymal component measuring 2.4 x 3.0 cm. There is blood in the quadrigeminal plate cistern. There is moderate global atrophy and mild chronic microvascular ischemic changes. No midline shift. Vascular: No hyperdense vessel or unexpected calcification. Skull: Normal. Negative for fracture or focal lesion. Sinuses/Orbits: No acute finding. Other: None IMPRESSION: 1. Significant interval increase in the subarachnoid hemorrhage within the left Sylvian fissure and interval extension into the left parietal and occipital lobes. Interval development of intraparenchymal hemorrhage in the left temporal lobe as well as blood in the quadrigeminal plate cistern. Although findings are likely traumatic in nature or may be related to hemorrhagic conversion of an infarct, the possibility of a left MCA aneurysm or a small AVM in the left temporal lobe, although less likely, is not excluded. Further evaluation with CT angiography recommended. 2. No midline shift. These results were called by telephone at the time of interpretation on 06/14/2018 at 5:10 am to nurse [facility], who verbally acknowledged these results. Electronically Signed   By: Anner Crete M.D.   On: 06/14/2018 05:13   Ct Head Wo Contrast  Result Date: 06/13/2018 CLINICAL DATA:  84 year old female with fall. EXAM: CT HEAD WITHOUT CONTRAST CT CERVICAL SPINE WITHOUT CONTRAST TECHNIQUE: Multidetector CT imaging of the head and cervical spine was performed following the standard protocol without intravenous contrast. Multiplanar CT image reconstructions of the cervical spine were also generated. COMPARISON:  Head CT dated 06/12/2018 FINDINGS: CT HEAD FINDINGS Brain: There is a small left frontal subdural hemorrhage, new since the prior CT measuring up to 4 mm in thickness. Linear areas of high attenuation along the cortical surface in the left  frontal lobe (series 3, image 18) may represent areas of cortical contusion although trace subarachnoid hemorrhage is not excluded. There is trace subarachnoid hemorrhage layering in the left sylvian fissure (series 3, image 14 and coronal series 5, image 41). There is moderate age-related atrophy and chronic microvascular ischemic changes. No mass effect or midline shift. Vascular: No hyperdense vessel or unexpected calcification. Skull: Normal. Negative for fracture or focal lesion. Sinuses/Orbits: No acute finding. Other: None CT CERVICAL SPINE FINDINGS Alignment: No acute subluxation. There is straightening of normal cervical lordosis which may be positional or due to muscle spasm Skull base and vertebrae: No acute fracture. Osteopenia. Soft tissues and spinal canal: No prevertebral fluid or swelling. No visible canal hematoma. Disc levels:  Multilevel degenerative changes. Anterior osteophytes. Upper chest: Biapical subpleural scarring suboptimally evaluated and only partially visualized. Other: Bilateral carotid bulb calcified plaques. IMPRESSION: 1. Small left frontal subdural hemorrhage as well as trace subarachnoid hemorrhage in the left sylvian fissure, new since the prior CT. 2. No acute/traumatic cervical spine pathology. These results were called by telephone at the time of  interpretation on 06/13/2018 at 12:40 am to Dr. Pryor Curia , who verbally acknowledged these results. Electronically Signed   By: Anner Crete M.D.   On: 06/13/2018 00:43   Ct Head Wo Contrast  Result Date: 06/12/2018 CLINICAL DATA:  84 year old female post fall hitting back of head. No reported loss of consciousness. Initial encounter. EXAM: CT HEAD WITHOUT CONTRAST TECHNIQUE: Contiguous axial images were obtained from the base of the skull through the vertex without intravenous contrast. COMPARISON:  11/05/2016 CT. FINDINGS: Brain: No intracranial hemorrhage or CT evidence of large acute infarct. Chronic microvascular  changes. Global atrophy. No intracranial mass lesion noted on this unenhanced exam. Vascular: Vascular calcifications Skull: No skull fracture Sinuses/Orbits: No acute orbital abnormality. Visualized paranasal sinuses are clear. Other: Mastoid air cells and middle ear cavities are clear. IMPRESSION: 1. No skull fracture or intracranial hemorrhage. 2. Chronic microvascular changes. 3. Global atrophy. Electronically Signed   By: Genia Del M.D.   On: 06/12/2018 19:13   Ct Cervical Spine Wo Contrast  Result Date: 06/13/2018 CLINICAL DATA:  84 year old female with fall. EXAM: CT HEAD WITHOUT CONTRAST CT CERVICAL SPINE WITHOUT CONTRAST TECHNIQUE: Multidetector CT imaging of the head and cervical spine was performed following the standard protocol without intravenous contrast. Multiplanar CT image reconstructions of the cervical spine were also generated. COMPARISON:  Head CT dated 06/12/2018 FINDINGS: CT HEAD FINDINGS Brain: There is a small left frontal subdural hemorrhage, new since the prior CT measuring up to 4 mm in thickness. Linear areas of high attenuation along the cortical surface in the left frontal lobe (series 3, image 18) may represent areas of cortical contusion although trace subarachnoid hemorrhage is not excluded. There is trace subarachnoid hemorrhage layering in the left sylvian fissure (series 3, image 14 and coronal series 5, image 41). There is moderate age-related atrophy and chronic microvascular ischemic changes. No mass effect or midline shift. Vascular: No hyperdense vessel or unexpected calcification. Skull: Normal. Negative for fracture or focal lesion. Sinuses/Orbits: No acute finding. Other: None CT CERVICAL SPINE FINDINGS Alignment: No acute subluxation. There is straightening of normal cervical lordosis which may be positional or due to muscle spasm Skull base and vertebrae: No acute fracture. Osteopenia. Soft tissues and spinal canal: No prevertebral fluid or swelling. No  visible canal hematoma. Disc levels:  Multilevel degenerative changes. Anterior osteophytes. Upper chest: Biapical subpleural scarring suboptimally evaluated and only partially visualized. Other: Bilateral carotid bulb calcified plaques. IMPRESSION: 1. Small left frontal subdural hemorrhage as well as trace subarachnoid hemorrhage in the left sylvian fissure, new since the prior CT. 2. No acute/traumatic cervical spine pathology. These results were called by telephone at the time of interpretation on 06/13/2018 at 12:40 am to Dr. Pryor Curia , who verbally acknowledged these results. Electronically Signed   By: Anner Crete M.D.   On: 06/13/2018 00:43   Ct Hip Left Wo Contrast  Result Date: 06/13/2018 CLINICAL DATA:  84 year old female with possible left femoral neck fracture. EXAM: CT OF THE LEFT HIP WITHOUT CONTRAST TECHNIQUE: Multidetector CT imaging of the left hip was performed according to the standard protocol. Multiplanar CT image reconstructions were also generated. COMPARISON:  Earlier hip radiograph dated 06/13/2018 FINDINGS: Bones/Joint/Cartilage There is a nondisplaced fracture of the left femoral neck with mild impaction. The fracture is poorly visualized due to severe osteopenia. No dislocation. There is mild to moderate arthritic changes of the left hip. No joint effusion. Ligaments Suboptimally assessed by CT. Muscles and Tendons No acute findings. Soft tissues A  pessary is noted in the pelvis. There is sigmoid diverticulosis. Atherosclerotic calcification of the vasculature. IMPRESSION: 1. Nondisplaced fracture of the left femoral neck with mild impaction. No dislocation. 2. Advanced osteopenia. Electronically Signed   By: Anner Crete M.D.   On: 06/13/2018 02:22   Dg C-arm 1-60 Min  Result Date: 06/13/2018 CLINICAL DATA:  Left hip surgery EXAM: DG C-ARM 61-120 MIN; OPERATIVE LEFT HIP WITH PELVIS COMPARISON:  06/13/2018 FINDINGS: Two low resolution intraoperative spot views of  the left hip. Total fluoroscopy time was 6 seconds. The images demonstrate 3 threaded screw fixation of left femoral neck fracture. IMPRESSION: Intraoperative fluoroscopic assistance provided during surgical fixation of left femoral neck fracture Electronically Signed   By: Donavan Foil M.D.   On: 06/13/2018 19:57   Dg Hip Operative Unilat W Or W/o Pelvis Left  Result Date: 06/13/2018 CLINICAL DATA:  Left hip surgery EXAM: DG C-ARM 61-120 MIN; OPERATIVE LEFT HIP WITH PELVIS COMPARISON:  06/13/2018 FINDINGS: Two low resolution intraoperative spot views of the left hip. Total fluoroscopy time was 6 seconds. The images demonstrate 3 threaded screw fixation of left femoral neck fracture. IMPRESSION: Intraoperative fluoroscopic assistance provided during surgical fixation of left femoral neck fracture Electronically Signed   By: Donavan Foil M.D.   On: 06/13/2018 19:57   Dg Hip Unilat W Or Wo Pelvis 2-3 Views Left  Result Date: 06/13/2018 CLINICAL DATA:  84 year old female with fall and left lower extremity pain. EXAM: DG HIP (WITH OR WITHOUT PELVIS) 2-3V LEFT; LEFT FEMUR 2 VIEWS; LEFT TIBIA AND FIBULA - 2 VIEW COMPARISON:  None. FINDINGS: There is angulated appearance of left femoral neck concerning for a nondisplaced subcapital femoral neck fracture. Further evaluation with CT or MRI is recommended. There is no other acute fracture. No dislocation. The bones are osteopenic. Moderate arthritic changes of the hips more prominent on the left. The soft tissues are unremarkable. IMPRESSION: Probable nondisplaced left femoral neck fracture. Further evaluation with CT or MRI is recommended. Electronically Signed   By: Anner Crete M.D.   On: 06/13/2018 01:05   Dg Femur Min 2 Views Left  Result Date: 06/13/2018 CLINICAL DATA:  84 year old female with fall and left lower extremity pain. EXAM: DG HIP (WITH OR WITHOUT PELVIS) 2-3V LEFT; LEFT FEMUR 2 VIEWS; LEFT TIBIA AND FIBULA - 2 VIEW COMPARISON:  None.  FINDINGS: There is angulated appearance of left femoral neck concerning for a nondisplaced subcapital femoral neck fracture. Further evaluation with CT or MRI is recommended. There is no other acute fracture. No dislocation. The bones are osteopenic. Moderate arthritic changes of the hips more prominent on the left. The soft tissues are unremarkable. IMPRESSION: Probable nondisplaced left femoral neck fracture. Further evaluation with CT or MRI is recommended. Electronically Signed   By: Anner Crete M.D.   On: 06/13/2018 01:05    Anti-infectives: Anti-infectives (From admission, onward)   Start     Dose/Rate Route Frequency Ordered Stop   06/14/18 0600  ceFAZolin (ANCEF) IVPB 2g/100 mL premix     2 g 200 mL/hr over 30 Minutes Intravenous On call to O.R. 06/13/18 1537 06/13/18 1700   06/13/18 1600  ceFAZolin (ANCEF) 2-4 GM/100ML-% IVPB    Note to Pharmacy:  [facility]   : cabinet override      06/13/18 1600 06/13/18 1700     Assessment/Plan Fall TBI w/ SAH/SDH - per Dr. Trenton Gammon, interval increase in [facility] overnight Left femoral neck FX - s/p Open treatment of proximal end of femur,  neck with internal fixation Dr. Stann Mainland 12/10; WBAT, staple removal in 2 weeks, VTE with 81mg  ASA daily x 6 weeks when ok with NS. HTN HLD Hypothyroidism Dementia  Thrombocytopenia - platelets 112 ?UTI - WBC WNL, afebrile, trace leuks and large bacteria in UA; will order Cx  FEN - start soft diet ID - Ancef 12/10 VTE - PAS, chemical VTE held due to [place], remove bedrest, PT/OT    LOS: 1 day    Obie Dredge, [facility] Pager: [phone]

## 2018-06-14 NOTE — Clinical Social Work Note (Signed)
Clinical Social Work Assessment  Patient Details  Name: Misty Carroll MRN: [id number] Date of Birth: 03/02/1933  Date of referral:  06/14/18               Reason for consult:  Facility Placement, Discharge Planning                Permission sought to share information with:    Permission granted to share information::     Name::        Agency::     Relationship::     Contact Information:     Housing/Transportation Living arrangements for the past 2 months:  Single Family Home Source of Information:  Adult Children(Janet) Patient Interpreter Needed:  None Criminal Activity/Legal Involvement Pertinent to Current Situation/Hospitalization:    Significant Relationships:  Adult Children Lives with:  Self Do you feel safe going back to the place where you live?  Yes Need for family participation in patient care:  Yes (Comment)  Care giving concerns:  No concerns voiced by patients daughter, Marcie Bal, at this time.    Social Worker assessment / plan: CSW met with patients daughter, Marcie Bal, via bedside- patient was sleeping during conversation and is not currently oriented. Patients daughter is agreeable to SNF placement in the event PT recommends SNF at discharge. CSW informed daughter that PT would have to make a recommendation for patient to go to facility/ PT could make recommendations for [facility] which RN CM would follow up with - daughter voiced understanding. Per daughter, patient has never been to a SNF in the past and currently does not have any preferences. Patient and family live in the [place]/ [place] area and would prefer a facility there. CSW will complete FL2 and provide patient with bed offers in the event PT recommends SNF placement.   CSW will continue to follow.   Employment status:  Retired Forensic scientist:  Medicare PT Recommendations:  Not assessed at this time Information / Referral to community resources:  [place]  Patient/Family's Response  to care: Daughter appreciated CSW.   Patient/Family's Understanding of and Emotional Response to Diagnosis, Current Treatment, and Prognosis:  Daughter understands current discharge plan to wait for PT recommendations and if recommending SNF they would prefer to go to a facility in [place].    Emotional Assessment Appearance:  Appears stated age Attitude/Demeanor/Rapport:    Affect (typically observed):  Unable to Assess Orientation:  Fluctuating Orientation (Suspected and/or reported [place]) Alcohol / Substance use:    Psych involvement (Current and /or in the community):  No (Comment)  Discharge Needs  Concerns to be addressed:  No discharge needs identified Readmission within the last 30 days:  No Current discharge risk:  None Barriers to Discharge:  No Barriers Identified   Weston Anna, LCSW 06/14/2018, 3:46 PM

## 2018-06-14 NOTE — Progress Notes (Signed)
OT Cancellation Note  Patient Details Name: NALIA HONEYCUTT MRN: [id number] DOB: [birth year]   Cancelled Treatment:    Reason Eval/Treat Not Completed: Active bedrest order. OT will continue to follow for orders to change to progress activity  Jaci Carrel 06/14/2018, 8:06 AM  [place] Pager: [phone] Office: [phone]

## 2018-06-14 NOTE — Progress Notes (Signed)
Rehab Admissions Coordinator Note:  Patient was screened by Cleatrice Burke for appropriateness for an Inpatient Acute Rehab Consult per PT and OT recommendations.   At this time, we are recommending Inpatient Rehab consult.  Cleatrice Burke 06/14/2018, 7:26 PM  I can be reached at [phone].

## 2018-06-14 NOTE — Progress Notes (Signed)
   Subjective:  Patient reports pain as mild.  Up in bed eating lunch.  No compailnts from patient who is pleasantly demented, or her family members who are not.  Objective:   VITALS:   Vitals:   06/14/18 0339 06/14/18 0838 06/14/18 1159 06/14/18 1200  BP: (!) 156/69 (!) 151/66 130/64 130/64  Pulse: 70 72 79 63  Resp: 16 20 (!) 22 16  Temp: (!) 97.5 F (36.4 C) 97.8 F (36.6 C) 97.7 F (36.5 C)   TempSrc: Oral Oral Oral   SpO2: 100% 94% 97% 95%  Weight:      Height:        Dorsiflexion/Plantar flexion intact Incision: dressing C/D/I NVI No pain with passive stretch, she spontaneously moves the extremity  Lab Results  Component Value Date   WBC 6.8 06/14/2018   HGB 12.7 06/14/2018   HCT 40.0 06/14/2018   MCV 97.6 06/14/2018   PLT 112 (L) 06/14/2018   BMET    Component Value Date/Time   NA 142 06/14/2018 0311   NA 132 (L) 11/06/2012 0242   K 3.7 06/14/2018 0311   K 3.7 11/06/2012 0242   CL 104 06/14/2018 0311   CL 96 (L) 11/06/2012 0242   CO2 28 06/14/2018 0311   CO2 30 11/06/2012 0242   GLUCOSE 118 (H) 06/14/2018 0311   GLUCOSE 126 (H) 11/06/2012 0242   BUN 8 06/14/2018 0311   BUN 17 11/06/2012 0242   CREATININE 0.74 06/14/2018 0311   CREATININE 0.71 11/06/2012 0242   CALCIUM 8.8 (L) 06/14/2018 0311   CALCIUM 8.4 (L) 11/06/2012 0242   GFRNONAA >60 06/14/2018 0311   GFRNONAA >60 11/06/2012 0242   GFRAA >60 06/14/2018 0311   GFRAA >60 11/06/2012 0242     Assessment/Plan: 1 Day Post-Op   Active Problems:   Fall   Up with therapy WBAT to LLE Maintain dressing until follow up.  If becomes saturated or soiled may change to daily dry dressings 81 mg asa for dvt ppx daily x 6 weeks   Nicholes Stairs 06/14/2018, 3:29 PM   Geralynn Rile, MD [phone]

## 2018-06-14 NOTE — NC FL2 (Signed)
[place] LEVEL OF CARE SCREENING TOOL     IDENTIFICATION  Patient Name: Misty Carroll Birthdate: 05-16-1933 Sex: female Admission Date (Current Location): 06/12/2018  [facility] and [place] Number:  Herbalist and Address:  [facility] [place] [address] [place], [place] [address]      Provider Number: [id number]  Attending Physician Name and Address:  Md, Trauma, MD  Relative Name and Phone Number:       Current Level of Care: Hospital Recommended Level of Care: [place] Prior Approval Number:    Date Approved/Denied:   PASRR Number:   [id number]   Discharge Plan: SNF    Current Diagnoses: Patient Active Problem List   Diagnosis Date Noted  . Fall 06/13/2018  . Uterine prolapse 01/19/2017  . Cystocele, midline 01/19/2017  . Cystocele with small rectocele and uterine descent 09/03/2016  . Uterovaginal prolapse, incomplete 09/03/2016  . Left-sided epistaxis 01/25/2013    Orientation RESPIRATION BLADDER Height & Weight     Self  Normal Incontinent Weight: 121 lb 4.1 oz (55 kg) Height:  5\' 6"  (167.6 cm)  BEHAVIORAL SYMPTOMS/MOOD NEUROLOGICAL BOWEL NUTRITION STATUS        Diet(Please review discharge summary)  AMBULATORY STATUS COMMUNICATION OF NEEDS Skin   Extensive Assist Verbally Surgical wounds(Incision left hip)                       Personal Care Assistance Level of Assistance  Bathing, Feeding, Dressing Bathing Assistance: Limited assistance Feeding assistance: Limited assistance Dressing Assistance: Limited assistance     Functional Limitations Info             SPECIAL CARE FACTORS FREQUENCY  PT (By licensed PT), OT (By licensed OT)     PT Frequency: 5 OT Frequency: 5            Contractures      Additional Factors Info  Code Status, Allergies Code Status Info: Full code Allergies Info: DONEPEZIL           Current Medications (06/14/2018):  This is the current  hospital active medication list Current Facility-Administered Medications  Medication Dose Route Frequency Provider Last Rate Last Dose  . 0.9 %  sodium chloride infusion  250 mL Intravenous PRN Georganna Skeans, MD      . acetaminophen (TYLENOL) tablet 650 mg  650 mg Oral Q6H Nicholes Stairs, MD   650 mg at 06/14/18 1541  . docusate sodium (COLACE) capsule 100 mg  100 mg Oral BID Nicholes Stairs, MD   100 mg at 06/14/18 0859  . donepezil (ARICEPT) tablet 5 mg  5 mg Oral Q breakfast Nicholes Stairs, MD   5 mg at 06/14/18 0859  . fentaNYL (SUBLIMAZE) injection 25 mcg  25 mcg Intravenous Q2H PRN Nicholes Stairs, MD   25 mcg at 06/14/18 0501  . hydrALAZINE (APRESOLINE) injection 10 mg  10 mg Intravenous Q2H PRN Nicholes Stairs, MD      . Derrill Memo ON 06/15/2018] Influenza vac split quadrivalent PF (FLUZONE HIGH-DOSE) injection 0.5 mL  0.5 mL Intramuscular Tomorrow-1000 Earnie Larsson, MD      . lactated ringers infusion   Intravenous Continuous Nicholes Stairs, MD 10 mL/hr at 06/13/18 1608    . [START ON 06/15/2018] levETIRAcetam (KEPPRA) tablet 500 mg  500 mg Oral BID Simaan, Elizabeth S, PA-C      . levothyroxine (SYNTHROID, LEVOTHROID) tablet 75 mcg  75  mcg Oral QAC breakfast Nicholes Stairs, MD   75 mcg at 06/14/18 [phone]  . methocarbamol (ROBAXIN) tablet 500 mg  500 mg Oral Q8H PRN Nicholes Stairs, MD      . metoCLOPramide [facility]) tablet 5-10 mg  5-10 mg Oral Q8H PRN Nicholes Stairs, MD       Or  . metoCLOPramide [facility]) injection 5-10 mg  5-10 mg Intravenous Q8H PRN Nicholes Stairs, MD      . ondansetron [facility]) tablet 4 mg  4 mg Oral Q6H PRN Nicholes Stairs, MD       Or  . ondansetron [facility]) injection 4 mg  4 mg Intravenous Q6H PRN Nicholes Stairs, MD      . polyethylene glycol [facility] Raveling) packet 17 g  17 g Oral Daily PRN Nicholes Stairs, MD      . sodium chloride flush (NS) 0.9 % injection 3 mL  3 mL  Intravenous Q12H Georganna Skeans, MD      . sodium chloride flush (NS) 0.9 % injection 3 mL  3 mL Intravenous PRN Georganna Skeans, MD         Discharge Medications: Please see discharge summary for a list of discharge medications.  Relevant Imaging Results:  Relevant Lab Results:   Additional Information [id number]  Weston Anna, LCSW

## 2018-06-14 NOTE — Evaluation (Signed)
Occupational Therapy Evaluation Patient Details Name: Misty Carroll MRN: [id number] DOB: 1932-08-31 Today's Date: 06/14/2018    History of Present Illness Pt is an 84 y/o female s/p ORIF for L femoral neck fracture. Pt has a past medical history of Alzheimer's, Cystocele with rectocele, High cholesterol, Hypertension, and Uterovaginal prolapse.    Clinical Impression   PTA Pt was independent in mobility and ADL. She does not drive and family assists with cooking - but Pt does own cleaning. Pt is currently mod A +2 for stand pivot transfer with strong posterior lean. She is set up for UB ADL and mod to max A for LB ADL. Pt will benefit from skilled OT in the acute setting as well as afterwards at the CIR level. Pt can participate in 3 hours of therapy, and is eager and willing to work towards PLOF (independent) Pt has supportive family. OT will continue to follow and focus next session on continued transfers and standing balance for grooming tasks.   Of note: I am recommending a Palliative Consult as well as the patient is currently a full code and since she has Alzheimer's to discuss a true plan of care should a major medical event occur.    Follow Up Recommendations  CIR;Supervision/Assistance - 24 hour(SNF as alternative option if familiy cannot do 24 hour care)    Equipment Recommendations  3 in 1 bedside commode;Tub/shower bench    Recommendations for Other Services Other (comment)(Palliative)     Precautions / Restrictions Precautions Precautions: Fall Restrictions Weight Bearing Restrictions: Yes LLE Weight Bearing: Weight bearing as tolerated      Mobility Bed Mobility Overal bed mobility: Needs Assistance Bed Mobility: Supine to Sit     Supine to sit: Min guard;HOB elevated     General bed mobility comments: able to come to left side of bed min guard for safety  Transfers Overall transfer level: Needs assistance Equipment used: 2 person hand held  assist Transfers: Sit to/from [employer] Transfers Sit to Stand: Min assist;+2 safety/equipment Stand pivot transfers: Mod assist;+2 physical assistance;+2 safety/equipment       General transfer comment: assist for boost, with pivot developed strong posterior lean and required cues for seuquencing in addition to balance    Balance Overall balance assessment: Needs assistance;History of Falls Sitting-balance support: No upper extremity supported;Feet supported Sitting balance-Leahy Scale: Fair   Postural control: Posterior lean(in standing) Standing balance support: Bilateral upper extremity supported Standing balance-Leahy Scale: Poor Standing balance comment: strong posterior lean in standing - also increased pain                           ADL either performed or assessed with clinical judgement   ADL Overall ADL's : Needs assistance/impaired Eating/Feeding: Set up;Sitting   Grooming: Wash/dry face;Set up;Sitting Grooming Details (indicate cue type and reason): EOB Upper Body Bathing: Set up;Sitting   Lower Body Bathing: Maximal assistance;Sitting/lateral leans   Upper Body Dressing : Set up;Sitting   Lower Body Dressing: Maximal assistance;Sit to/from stand   Toilet Transfer: Moderate assistance;+2 for physical assistance;+2 for safety/equipment;Stand-pivot Toilet Transfer Details (indicate cue type and reason): simulated through recliner transfer [place] and Hygiene: Maximal assistance;Sit to/from stand       Functional mobility during ADLs: Moderate assistance;+2 for physical assistance;+2 for safety/equipment;Cueing for sequencing(HHA, strong posterior lean) General ADL Comments: Pt requires cues for task completion     Vision Baseline Vision/History: Wears glasses Additional Comments: Did not test,  please assess next session     Perception     Praxis      Pertinent Vitals/Pain Pain Assessment: Faces Faces Pain  Scale: Hurts even more Pain Location: L leg Pain Descriptors / Indicators: Discomfort;Sore;Grimacing Pain Intervention(s): Limited activity within patient's tolerance;Monitored during session;Repositioned     Hand Dominance Right   Extremity/Trunk Assessment Upper Extremity Assessment Upper Extremity Assessment: Overall WFL for tasks assessed   Lower Extremity Assessment Lower Extremity Assessment: Defer to PT evaluation       Communication Communication Communication: Expressive difficulties(word finding)   Cognition Arousal/Alertness: Awake/alert Behavior During Therapy: WFL for tasks assessed/performed;Flat affect Overall Cognitive Status: Impaired/Different from baseline Area of Impairment: Orientation;Following commands;Memory;Safety/judgement;Awareness;Problem solving                 Orientation Level: Disoriented to;Place;Situation   Memory: Decreased short-term memory(amnesia for sx) Following Commands: Follows one step commands with increased time;Follows one step commands inconsistently Safety/Judgement: Decreased awareness of safety;Decreased awareness of deficits Awareness: Emergent Problem Solving: Slow processing;Decreased initiation;Requires verbal cues General Comments: did not know where she was, cannot remember that she had sx, requires extra time for commands and cues for problem solving   General Comments  extensive discussion with family about discharge planning afterwards, they are open and very supportive    Exercises     Shoulder Instructions      Home Living Family/patient expects to be discharged to:: Private residence Living Arrangements: Alone Available Help at Discharge: Family Type of Home: House Home Access: Stairs to enter Technical brewer of Steps: 1 Entrance Stairs-Rails: None Home Layout: One level     Bathroom Shower/Tub: Corporate investment banker: Standard     Home Equipment: None           Prior Functioning/Environment Level of Independence: Independent        Comments: does not drive anymore,         OT Problem List: Decreased strength;Decreased range of motion;Decreased activity tolerance;Impaired balance (sitting and/or standing);Decreased cognition;Decreased knowledge of use of DME or AE;Pain      OT Treatment/Interventions: Self-care/ADL training;DME and/or AE instruction;Therapeutic activities;Patient/family education;Balance training;Cognitive remediation/compensation    OT Goals(Current goals can be found in the care plan section) Acute Rehab OT Goals Patient Stated Goal: to get back to independent OT Goal Formulation: With patient Time For Goal Achievement: 06/28/18 Potential to Achieve Goals: Good  OT Frequency: Min 2X/week   Barriers to D/C:    Pt typically lives alone - has supportive family       Co-evaluation PT/OT/SLP Co-Evaluation/Treatment: Yes Reason for Co-Treatment: Necessary to address cognition/behavior during functional activity;For patient/therapist safety;To address functional/ADL transfers PT goals addressed during session: Mobility/safety with mobility;Balance;Strengthening/ROM OT goals addressed during session: ADL's and self-care;Strengthening/ROM      AM-PAC OT "6 Clicks" Daily Activity     Outcome Measure Help from another person eating meals?: None Help from another person taking care of personal grooming?: A Little Help from another person toileting, which includes using toliet, bedpan, or urinal?: A Lot Help from another person bathing (including washing, rinsing, drying)?: A Lot Help from another person to put on and taking off regular upper body clothing?: None Help from another person to put on and taking off regular lower body clothing?: A Lot 6 Click Score: 17   End of Session Equipment Utilized During Treatment: Gait belt Nurse Communication: Mobility status  Activity Tolerance: Patient tolerated treatment  well Patient left: in chair;with call bell/phone within reach;with chair alarm  set;with family/visitor present  OT Visit Diagnosis: Unsteadiness on feet (R26.81);History of falling (Z91.81);Other abnormalities of gait and mobility (R26.89);Pain;Other symptoms and signs involving cognitive function Pain - Right/Left: Left Pain - part of body: Leg                Time: 5701-7793 OT Time Calculation (min): 38 min Charges:  OT General Charges $OT Visit: 1 Visit OT Evaluation $OT Eval Moderate Complexity: 1 Mod OT Treatments $Self Care/Home Management : 8-22 mins  Hulda Humphrey OTR/L Acute Rehabilitation Services Pager: [phone] Office: [place] 06/14/2018, 5:30 PM

## 2018-06-14 NOTE — Anesthesia Postprocedure Evaluation (Signed)
Anesthesia Post Note  Patient: Misty Carroll  Procedure(s) Performed: CANNULATED HIP PINNING (Left )     Patient location during evaluation: PACU Anesthesia Type: General Level of consciousness: awake and patient cooperative Pain management: pain level controlled Vital Signs Assessment: post-procedure vital signs reviewed and stable Respiratory status: spontaneous breathing, nonlabored ventilation, respiratory function stable and patient connected to nasal cannula oxygen Cardiovascular status: blood pressure returned to baseline and stable Postop Assessment: no apparent nausea or vomiting Anesthetic complications: no    Last Vitals:  Vitals:   06/14/18 1159 06/14/18 1200  BP: 130/64 130/64  Pulse: 79 63  Resp: (!) 22 16  Temp: 36.5 C   SpO2: 97% 95%    Last Pain:  Vitals:   06/14/18 1159  TempSrc: Oral  PainSc:                  Zilphia Kozinski

## 2018-06-14 NOTE — Evaluation (Signed)
Speech Language Pathology Evaluation Patient Details Name: Misty Carroll MRN: [id number] DOB: Jul 14, 1932 Today's Date: 06/14/2018 Time: 0940-1000 SLP Time Calculation (min) (ACUTE ONLY): 20 min  Problem List:  Patient Active Problem List   Diagnosis Date Noted  . Fall 06/13/2018  . Uterine prolapse 01/19/2017  . Cystocele, midline 01/19/2017  . Cystocele with small rectocele and uterine descent 09/03/2016  . Uterovaginal prolapse, incomplete 09/03/2016  . Left-sided epistaxis 01/25/2013   Past Medical History:  Past Medical History:  Diagnosis Date  . Cystocele with rectocele   . High cholesterol   . Hypertension   . Uterovaginal prolapse, incomplete    Past Surgical History: History reviewed. No pertinent surgical history. HPI:  84 year old female with 2 separate episodes of falling.  Patient with likely syncopal episodes and subsequent falls.  This most recent fall with a brief loss of consciousness.  Patient amnestic to the events around her fall.  She complains of some hip discomfort.  Underwent hip surgery on 12/10, day of admit.   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     Assessment / Plan / Recommendation Clinical Impression  Pt functions with mild dementia at baseline, she is typically independent with basic self care can prepare simple foods, lives alone but family comes once a day; pts medications are taken independently but family checks her administration regularly, pt manages her finances with supervision. Today her short term and working memory is more impaired than baseline but otherwise she is appropriate, pleasant and attentive. Increased confusion appears more related to abnormal environment and recovery from surgery than cognitive impairment from TBI. Advised daughters to provide increased supervision if/when pt returns home with medications in particular to determine that pt is able to administer them correctly rather than assuming she  is back to her baseline. They verbalize understanding. No further SLP f/u needed.     SLP Assessment  SLP Recommendation/Assessment: Patient does not need any further Speech Lanaguage Pathology Services    Follow Up Recommendations       Frequency and Duration           SLP Evaluation Cognition  Overall Cognitive Status: Impaired/Different from baseline Arousal/Alertness: Awake/alert Orientation Level: Oriented to person;Oriented to place;Disoriented to time;Disoriented to situation(not abnormal for her to be disoriented to time) Attention: Sustained Sustained Attention: Appears intact Memory: Impaired Memory Impairment: Decreased long term memory;Decreased short term memory Decreased Long Term Memory: Verbal basic Decreased Short Term Memory: Verbal basic Problem Solving: Impaired Problem Solving Impairment: Verbal basic;Functional basic Executive Function: Self Monitoring Self Monitoring: Impaired Self Monitoring Impairment: Verbal basic;Functional basic Safety/Judgment: Appears intact       Comprehension  Auditory Comprehension Overall Auditory Comprehension: Appears within functional limits for tasks assessed    Expression Verbal Expression Overall Verbal Expression: Appears within functional limits for tasks assessed   Oral / Motor  Oral Motor/Sensory Function Overall Oral Motor/Sensory Function: Within functional limits Motor Speech Overall Motor Speech: Appears within functional limits for tasks assessed   GO                   Herbie Baltimore, MA CCC-SLP  Acute Rehabilitation Services Pager [phone] Office [phone]  Lynann Beaver 06/14/2018, 10:14 AM

## 2018-06-14 NOTE — Evaluation (Signed)
Physical Therapy Evaluation Patient Details Name: Misty Carroll MRN: [id number] DOB: 1933-05-28 Today's Date: 06/14/2018   History of Present Illness  Pt is an 84 y/o female s/p ORIF for L femoral neck fracture. Pt has a past medical history of Cystocele with rectocele, High cholesterol, Hypertension, dementia and Uterovaginal prolapse.   Clinical Impression  Patient admitted with above diagnosis. Prior to admission, patient lives alone and is independent with ADL's and mobility without an assistive device. Pt family present and assisted with providing PLOF. On PT evaluation, patient presenting with decreased functional mobility secondary to cognitive impairments, left hip pain, functional strength deficits, and balance impairments. Performing stand pivot transfers with two person moderate assistance. Will likely progress well with mobility using a walker. Recommending CIR to maximize functional independence and decrease caregiver burden. Will follow acutely to progress mobility.     Follow Up Recommendations CIR;Supervision/Assistance - 24 hour    Equipment Recommendations  Rolling walker with 5" wheels    Recommendations for Other Services       Precautions / Restrictions Precautions Precautions: Fall Restrictions Weight Bearing Restrictions: Yes LLE Weight Bearing: Weight bearing as tolerated      Mobility  Bed Mobility Overal bed mobility: Needs Assistance Bed Mobility: Supine to Sit     Supine to sit: Min guard;HOB elevated     General bed mobility comments: able to come to left side of bed min guard for safety  Transfers Overall transfer level: Needs assistance Equipment used: 2 person hand held assist Transfers: Sit to/from [employer] Transfers Sit to Stand: Min assist;+2 safety/equipment Stand pivot transfers: Mod assist;+2 physical assistance;+2 safety/equipment       General transfer comment: assist for boost, with pivot developed strong posterior  lean and required cues for seuquencing in addition to balance  Ambulation/Gait                Stairs            Wheelchair Mobility    Modified Rankin (Stroke Patients Only)       Balance Overall balance assessment: Needs assistance;History of Falls Sitting-balance support: No upper extremity supported;Feet supported Sitting balance-Leahy Scale: Fair   Postural control: Posterior lean(in standing) Standing balance support: Bilateral upper extremity supported Standing balance-Leahy Scale: Poor Standing balance comment: strong posterior lean in standing - also increased pain                             Pertinent Vitals/Pain Pain Assessment: Faces Faces Pain Scale: Hurts even more Pain Location: L leg with weightbearing Pain Descriptors / Indicators: Discomfort;Sore;Grimacing Pain Intervention(s): Monitored during session;Limited activity within patient's tolerance    Home Living Family/patient expects to be discharged to:: Private residence Living Arrangements: Alone Available Help at Discharge: Family Type of Home: House Home Access: Stairs to enter Entrance Stairs-Rails: None Technical brewer of Steps: 1 Home Layout: One level Home Equipment: None      Prior Function Level of Independence: Independent         Comments: does not drive anymore,      Hand Dominance   Dominant Hand: Right    Extremity/Trunk Assessment   Upper Extremity Assessment Upper Extremity Assessment: Overall WFL for tasks assessed    Lower Extremity Assessment Lower Extremity Assessment: RLE deficits/detail;LLE deficits/detail RLE Deficits / Details: Strength 5/5 LLE Deficits / Details: At least anti gravity strength. S/p ORIF       Communication   Communication: Expressive  difficulties(word finding)  Cognition Arousal/Alertness: Awake/alert Behavior During Therapy: WFL for tasks assessed/performed;Flat affect Overall Cognitive Status:  Impaired/Different from baseline Area of Impairment: Orientation;Following commands;Memory;Safety/judgement;Awareness;Problem solving                 Orientation Level: Disoriented to;Place;Situation   Memory: Decreased short-term memory(amnesia for sx) Following Commands: Follows one step commands with increased time;Follows one step commands inconsistently Safety/Judgement: Decreased awareness of safety;Decreased awareness of deficits Awareness: Emergent Problem Solving: Slow processing;Decreased initiation;Requires verbal cues General Comments: did not know where she was, cannot remember that she had sx, requires extra time for commands and cues for problem solving      General Comments General comments (skin integrity, edema, etc.): extensive discussion with family about discharge planning afterwards, they are open and very supportive    Exercises     Assessment/Plan    PT Assessment Patient needs continued PT services  PT Problem List Decreased strength;Decreased activity tolerance;Decreased balance;Decreased mobility;Decreased cognition;Pain       PT Treatment Interventions DME instruction;Gait training;Stair training;Functional mobility training;Therapeutic activities;Therapeutic exercise;Balance training;Patient/family education    PT Goals (Current goals can be found in the Care Plan section)  Acute Rehab PT Goals Patient Stated Goal: to get back to independent PT Goal Formulation: With patient/family Time For Goal Achievement: 06/28/18 Potential to Achieve Goals: Good    Frequency Min 5X/week   Barriers to discharge        Co-evaluation PT/OT/SLP Co-Evaluation/Treatment: Yes Reason for Co-Treatment: Complexity of the patient's impairments (multi-system involvement);Necessary to address cognition/behavior during functional activity;For patient/therapist safety;To address functional/ADL transfers PT goals addressed during session: Mobility/safety with  mobility OT goals addressed during session: ADL's and self-care;Strengthening/ROM       AM-PAC PT "6 Clicks" Mobility  Outcome Measure Help needed turning from your back to your side while in a flat bed without using bedrails?: None Help needed moving from lying on your back to sitting on the side of a flat bed without using bedrails?: None Help needed moving to and from a bed to a chair (including a wheelchair)?: A Lot Help needed standing up from a chair using your arms (e.g., wheelchair or bedside chair)?: A Little Help needed to walk in hospital room?: A Lot Help needed climbing 3-5 steps with a railing? : A Lot 6 Click Score: 17    End of Session Equipment Utilized During Treatment: Gait belt Activity Tolerance: Patient tolerated treatment well Patient left: in chair;with call bell/phone within reach;with chair alarm set;with family/visitor present Nurse Communication: Mobility status PT Visit Diagnosis: Unsteadiness on feet (R26.81);Pain;Difficulty in walking, not elsewhere classified (R26.2) Pain - Right/Left: Left Pain - part of body: Hip    Time: 0998-3382 PT Time Calculation (min) (ACUTE ONLY): 38 min   Charges:   PT Evaluation $PT Eval Moderate Complexity: 1 Mod         Ellamae Sia, [place], DPT Acute Rehabilitation Services Pager [phone] Office [phone]  Willy Eddy 06/14/2018, 5:36 PM

## 2018-06-15 LAB — CBC
HCT: 37.5 % (ref 36.0–46.0)
Hemoglobin: 11.6 g/dL — ABNORMAL LOW (ref 12.0–15.0)
MCH: 29.8 pg (ref 26.0–34.0)
MCHC: 30.9 g/dL (ref 30.0–36.0)
MCV: 96.4 fL (ref 80.0–100.0)
Platelets: 134 10*3/uL — ABNORMAL LOW (ref 150–400)
RBC: 3.89 MIL/uL (ref 3.87–5.11)
RDW: 13.5 % (ref 11.5–15.5)
WBC: 7.1 10*3/uL (ref 4.0–10.5)
nRBC: 0 % (ref 0.0–0.2)

## 2018-06-15 LAB — URINE CULTURE: Culture: NO GROWTH

## 2018-06-15 MED ORDER — RISPERIDONE 0.5 MG PO TABS
1.0000 mg | ORAL_TABLET | Freq: Every day | ORAL | Status: DC
  Administered 2018-06-15 – 2018-06-16 (×2): 1 mg via ORAL
  Filled 2018-06-15 (×3): qty 2

## 2018-06-15 NOTE — Progress Notes (Signed)
Physical Therapy Treatment Patient Details Name: Misty Carroll MRN: [id number] DOB: 07/22/1932 Today's Date: 06/15/2018    History of Present Illness Pt is an 84 y/o female s/p ORIF for L femoral neck fracture. Pt has a past medical history of Cystocele with rectocele, High cholesterol, Hypertension, dementia and Uterovaginal prolapse.     PT Comments    Patient progressing with hallway ambulation, but each time ambulating unsure why L leg hurts and needs reinforcement for safety and appropriate walker use.  Remains high fall risk due to decreased memory, safety awareness, posterior bias in standing and will benefit from CIR level rehab upon d/c.    Follow Up Recommendations  CIR;Supervision/Assistance - 24 hour     Equipment Recommendations  Rolling walker with 5" wheels    Recommendations for Other Services       Precautions / Restrictions Precautions Precautions: Fall Restrictions LLE Weight Bearing: Weight bearing as tolerated    Mobility  Bed Mobility               General bed mobility comments: up in chair  Transfers Overall transfer level: Needs assistance Equipment used: Rolling walker (2 wheeled) Transfers: Sit to/from Stand Sit to Stand: Min assist         General transfer comment: up with assist from chair for balance, safety  Ambulation/Gait Ambulation/Gait assistance: Min assist;Mod assist Gait Distance (Feet): 40 Feet(& 50') Assistive device: Rolling walker (2 wheeled) Gait Pattern/deviations: Step-to pattern;Step-through pattern;Decreased stride length     General Gait Details: assist for safety, for walker use, +2 for chair follow to sit and rest due to pain in L LE   Stairs             Wheelchair Mobility    Modified Rankin (Stroke Patients Only)       Balance Overall balance assessment: Needs assistance;History of Falls Sitting-balance support: No upper extremity supported;Feet supported Sitting balance-Leahy Scale:  Fair   Postural control: Posterior lean Standing balance support: Bilateral upper extremity supported   Standing balance comment: UE support and assist in initial standing due to posterior bias.                            Cognition Arousal/Alertness: Awake/alert Behavior During Therapy: WFL for tasks assessed/performed Overall Cognitive Status: Impaired/Different from baseline Area of Impairment: Orientation;Following commands;Memory;Safety/judgement;Awareness;Problem solving                 Orientation Level: Disoriented to;Place;Situation   Memory: Decreased short-term memory Following Commands: Follows one step commands with increased time;Follows one step commands inconsistently Safety/Judgement: Decreased awareness of safety;Decreased awareness of deficits Awareness: Emergent Problem Solving: Slow processing;Decreased initiation;Requires verbal cues General Comments: reminders needed for why L leg hurts each time up on her feet      Exercises General Exercises - Lower Extremity Ankle Circles/Pumps: AROM;10 reps;Both;Seated Short Arc Quad: AROM;10 reps;Left;Seated Heel Slides: AAROM;10 reps;Left;Seated Hip ABduction/ADduction: AROM;AAROM;10 reps;Left;Seated    General Comments        Pertinent Vitals/Pain Faces Pain Scale: Hurts little more Pain Location: L leg with weightbearing Pain Descriptors / Indicators: Discomfort;Sore;Grimacing Pain Intervention(s): Monitored during session;Repositioned;Limited activity within patient's tolerance    Home Living                      Prior Function            PT Goals (current goals can now be found in the care plan  section) Progress towards PT goals: Progressing toward goals    Frequency    Min 5X/week      PT Plan Current plan remains appropriate    Co-evaluation              AM-PAC PT "6 Clicks" Mobility   Outcome Measure  Help needed turning from your back to your side  while in a flat bed without using bedrails?: A Little Help needed moving from lying on your back to sitting on the side of a flat bed without using bedrails?: A Little Help needed moving to and from a bed to a chair (including a wheelchair)?: A Lot Help needed standing up from a chair using your arms (e.g., wheelchair or bedside chair)?: A Little Help needed to walk in hospital room?: A Lot Help needed climbing 3-5 steps with a railing? : A Lot 6 Click Score: 15    End of Session Equipment Utilized During Treatment: Gait belt Activity Tolerance: Patient tolerated treatment well Patient left: with call bell/phone within reach;in chair;with family/visitor present   PT Visit Diagnosis: Unsteadiness on feet (R26.81);Pain;Difficulty in walking, not elsewhere classified (R26.2) Pain - Right/Left: Left Pain - part of body: Hip     Time: 1355-1413 PT Time Calculation (min) (ACUTE ONLY): 18 min  Charges:  $Gait Training: 8-22 mins                     Magda Kiel, PT Acute Rehabilitation Services [phone] 06/15/2018    Misty Carroll 06/15/2018, 3:26 PM
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
## 2018-06-15 NOTE — Plan of Care (Signed)
  Problem: Education: Goal: Verbalization of understanding the information provided (i.e., activity precautions, restrictions, etc) will improve Outcome: Progressing Goal: Individualized Educational Video(s) Outcome: Progressing   Problem: Activity: Goal: Ability to ambulate and perform ADLs will improve Outcome: Progressing   Problem: Clinical Measurements: Goal: Postoperative complications will be avoided or minimized Outcome: Progressing   Problem: Self-Concept: Goal: Ability to maintain and perform role responsibilities to the fullest extent possible will improve Outcome: Progressing   Problem: Pain Management: Goal: Pain level will decrease Outcome: Progressing   Problem: Education: Goal: Knowledge of General Education information will improve Description Including pain rating scale, medication(s)/side effects and non-pharmacologic comfort measures Outcome: Progressing
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
## 2018-06-15 NOTE — Progress Notes (Signed)
Inpatient Rehabilitation Admissions Coordinator  I met with patient and a daughter at bedside. We discussed an inpt rehab admit vs SNF rehab. Daughter to clarify with her sister and brother if caregiver support after an inpt rehab admit can be arranged. I will follow up tomorrow.  Danne Baxter, RN, MSN Rehab Admissions Coordinator [phone] 06/15/2018 12:26 PM

## 2018-06-15 NOTE — Progress Notes (Signed)
Patient ID: Misty Carroll, female   DOB: 1932-09-07, 84 y.o.   MRN: [id number] 2 Days Post-Op  Subjective: Reports feeling "fine" Eating breakfast No new complaints  Objective: Vital signs in last 24 hours: Temp:  [97.4 F (36.3 C)-98.3 F (36.8 C)] 98.1 F (36.7 C) (12/12 0720) Pulse Rate:  [63-79] 73 (12/12 0720) Resp:  [13-22] 16 (12/12 0720) BP: (119-160)/(57-87) 160/87 (12/12 0720) SpO2:  [95 %-98 %] 98 % (12/12 0720)    Intake/Output from previous day: 12/11 0701 - 12/12 0700 In: 745.4 [P.O.:480; I.V.:265.4] Out: 250 [Urine:250] Intake/Output this shift: No intake/output data recorded.  General appearance: alert and cooperative Head: facial contusion evolving Resp: clear to auscultation bilaterally Cardio: regular rate and rhythm GI: soft, NT Extremities: calves soft  Neuro: alert, F/C  Lab Results: CBC  Recent Labs    06/14/18 0311 06/15/18 0738  WBC 6.8 7.1  HGB 12.7 11.6*  HCT 40.0 37.5  PLT 112* 134*   BMET Recent Labs    06/12/18 1115 06/13/18 0103 06/14/18 0311  NA 140 139 142  K 3.4* 3.3* 3.7  CL 101 100 104  CO2 28  --  28  GLUCOSE 114* 143* 118*  BUN 16 19 8   CREATININE 0.91 0.70 0.74  CALCIUM 8.9  --  8.8*   PT/INR Recent Labs    06/13/18 0045  LABPROT 15.1  INR 1.21   ABG No results for input(s): PHART, HCO3 in the last 72 hours.  Invalid input(s): PCO2, PO2  Studies/Results: Ct Head Wo Contrast  Result Date: 06/14/2018 CLINICAL DATA:  84 year old female with head trauma. Follow-up previously seen intracranial hemorrhage on 06/13/2018 EXAM: CT HEAD WITHOUT CONTRAST TECHNIQUE: Contiguous axial images were obtained from the base of the skull through the vertex without intravenous contrast. COMPARISON:  Head CT dated 06/13/2018 FINDINGS: Brain: Significant interval increase in the subarachnoid hemorrhage within the left sylvian fissure and interval extension of subarachnoid blood into the left parietal and left occipital lobes.  There is interval development of intraparenchymal hemorrhage in the left temporal lobe with the intraparenchymal component measuring 2.4 x 3.0 cm. There is blood in the quadrigeminal plate cistern. There is moderate global atrophy and mild chronic microvascular ischemic changes. No midline shift. Vascular: No hyperdense vessel or unexpected calcification. Skull: Normal. Negative for fracture or focal lesion. Sinuses/Orbits: No acute finding. Other: None IMPRESSION: 1. Significant interval increase in the subarachnoid hemorrhage within the left Sylvian fissure and interval extension into the left parietal and occipital lobes. Interval development of intraparenchymal hemorrhage in the left temporal lobe as well as blood in the quadrigeminal plate cistern. Although findings are likely traumatic in nature or may be related to hemorrhagic conversion of an infarct, the possibility of a left MCA aneurysm or a small AVM in the left temporal lobe, although less likely, is not excluded. Further evaluation with CT angiography recommended. 2. No midline shift. These results were called by telephone at the time of interpretation on 06/14/2018 at 5:10 am to nurse [facility], who verbally acknowledged these results. Electronically Signed   By: Anner Crete M.D.   On: 06/14/2018 05:13   Dg C-arm 1-60 Min  Result Date: 06/13/2018 CLINICAL DATA:  Left hip surgery EXAM: DG C-ARM 61-120 MIN; OPERATIVE LEFT HIP WITH PELVIS COMPARISON:  06/13/2018 FINDINGS: Two low resolution intraoperative spot views of the left hip. Total fluoroscopy time was 6 seconds. The images demonstrate 3 threaded screw fixation of left femoral neck fracture. IMPRESSION: Intraoperative fluoroscopic assistance provided during surgical  fixation of left femoral neck fracture Electronically Signed   By: Donavan Foil M.D.   On: 06/13/2018 19:57   Dg Hip Operative Unilat W Or W/o Pelvis Left  Result Date: 06/13/2018 CLINICAL DATA:  Left hip surgery EXAM: DG  C-ARM 61-120 MIN; OPERATIVE LEFT HIP WITH PELVIS COMPARISON:  06/13/2018 FINDINGS: Two low resolution intraoperative spot views of the left hip. Total fluoroscopy time was 6 seconds. The images demonstrate 3 threaded screw fixation of left femoral neck fracture. IMPRESSION: Intraoperative fluoroscopic assistance provided during surgical fixation of left femoral neck fracture Electronically Signed   By: Donavan Foil M.D.   On: 06/13/2018 19:57    Anti-infectives: Anti-infectives (From admission, onward)   Start     Dose/Rate Route Frequency Ordered Stop   06/14/18 0600  ceFAZolin (ANCEF) IVPB 2g/100 mL premix     2 g 200 mL/hr over 30 Minutes Intravenous On call to O.R. 06/13/18 1537 06/13/18 1700   06/13/18 1600  ceFAZolin (ANCEF) 2-4 GM/100ML-% IVPB    Note to Pharmacy:  [facility]   : cabinet override      06/13/18 1600 06/13/18 1700     Assessment/Plan: Fall TBI w/ SAH/SDH - per Dr. Trenton Gammon, plan F/U CT head 12/14 Left femoral neck FX - s/p Open treatment of proximal end of femur, neck with internal fixation Dr. Stann Mainland 12/10; WBAT, staple removal in 2 weeks, VTE with 81mg  ASA daily x 6 weeks when ok with NS (possibly after next F/U CT H) HTN HLD Hypothyroidism Dementia  Thrombocytopenia - platelets up to 134 ?UTI - WBC WNL, afebrile, trace leuks and large bacteria in UA; CX pending FEN - soft diet ID - off ABX VTE - PAS, chemical VTE held due to [place] - PT/OT, CIR consult  LOS: 2 days    Georganna Skeans, MD, MPH, FACS Trauma: [phone] General Surgery: [phone]  06/15/2018

## 2018-06-15 NOTE — Progress Notes (Signed)
Overall stable.  Remains moderately confused.  Speech reasonably fluent.  Motor and sensory function extremities normal.  Continue supportive care.  No new recommendations.

## 2018-06-16 ENCOUNTER — Encounter (HOSPITAL_COMMUNITY): Payer: Self-pay | Admitting: Nurse Practitioner

## 2018-06-16 ENCOUNTER — Inpatient Hospital Stay (HOSPITAL_COMMUNITY)
Admission: RE | Admit: 2018-06-16 | Discharge: 2018-07-06 | DRG: 092 | Disposition: A | Discharge status: Skilled Nursing Facility | Payer: Medicare Other | Source: Intra-hospital | Attending: Physical Medicine & Rehabilitation | Admitting: Physical Medicine & Rehabilitation

## 2018-06-16 ENCOUNTER — Other Ambulatory Visit: Payer: Self-pay

## 2018-06-16 DIAGNOSIS — I169 Hypertensive crisis, unspecified: Secondary | ICD-10-CM | POA: Diagnosis not present

## 2018-06-16 DIAGNOSIS — E876 Hypokalemia: Secondary | ICD-10-CM

## 2018-06-16 DIAGNOSIS — R413 Other amnesia: Secondary | ICD-10-CM | POA: Diagnosis present

## 2018-06-16 DIAGNOSIS — R4701 Aphasia: Secondary | ICD-10-CM | POA: Diagnosis not present

## 2018-06-16 DIAGNOSIS — S066X9S Traumatic subarachnoid hemorrhage with loss of consciousness of unspecified duration, sequela: Secondary | ICD-10-CM

## 2018-06-16 DIAGNOSIS — Z833 Family history of diabetes mellitus: Secondary | ICD-10-CM

## 2018-06-16 DIAGNOSIS — S065X9A Traumatic subdural hemorrhage with loss of consciousness of unspecified duration, initial encounter: Secondary | ICD-10-CM | POA: Diagnosis present

## 2018-06-16 DIAGNOSIS — S065X0D Traumatic subdural hemorrhage without loss of consciousness, subsequent encounter: Secondary | ICD-10-CM | POA: Diagnosis not present

## 2018-06-16 DIAGNOSIS — S065XAA Traumatic subdural hemorrhage with loss of consciousness status unknown, initial encounter: Secondary | ICD-10-CM | POA: Diagnosis present

## 2018-06-16 DIAGNOSIS — G479 Sleep disorder, unspecified: Secondary | ICD-10-CM | POA: Diagnosis not present

## 2018-06-16 DIAGNOSIS — S72145A Nondisplaced intertrochanteric fracture of left femur, initial encounter for closed fracture: Secondary | ICD-10-CM

## 2018-06-16 DIAGNOSIS — S069X0S Unspecified intracranial injury without loss of consciousness, sequela: Secondary | ICD-10-CM | POA: Diagnosis not present

## 2018-06-16 DIAGNOSIS — W19XXXD Unspecified fall, subsequent encounter: Secondary | ICD-10-CM | POA: Diagnosis present

## 2018-06-16 DIAGNOSIS — D62 Acute posthemorrhagic anemia: Secondary | ICD-10-CM | POA: Diagnosis present

## 2018-06-16 DIAGNOSIS — S065X9S Traumatic subdural hemorrhage with loss of consciousness of unspecified duration, sequela: Principal | ICD-10-CM

## 2018-06-16 DIAGNOSIS — F0281 Dementia in other diseases classified elsewhere with behavioral disturbance: Secondary | ICD-10-CM

## 2018-06-16 DIAGNOSIS — G309 Alzheimer's disease, unspecified: Secondary | ICD-10-CM | POA: Diagnosis present

## 2018-06-16 DIAGNOSIS — S72145S Nondisplaced intertrochanteric fracture of left femur, sequela: Secondary | ICD-10-CM

## 2018-06-16 DIAGNOSIS — F0280 Dementia in other diseases classified elsewhere without behavioral disturbance: Secondary | ICD-10-CM | POA: Diagnosis present

## 2018-06-16 DIAGNOSIS — Z888 Allergy status to other drugs, medicaments and biological substances status: Secondary | ICD-10-CM | POA: Diagnosis not present

## 2018-06-16 DIAGNOSIS — Z79890 Hormone replacement therapy (postmenopausal): Secondary | ICD-10-CM

## 2018-06-16 DIAGNOSIS — Z809 Family history of malignant neoplasm, unspecified: Secondary | ICD-10-CM | POA: Diagnosis not present

## 2018-06-16 DIAGNOSIS — S72002S Fracture of unspecified part of neck of left femur, sequela: Secondary | ICD-10-CM | POA: Diagnosis not present

## 2018-06-16 DIAGNOSIS — Z79899 Other long term (current) drug therapy: Secondary | ICD-10-CM

## 2018-06-16 DIAGNOSIS — K5900 Constipation, unspecified: Secondary | ICD-10-CM | POA: Diagnosis present

## 2018-06-16 DIAGNOSIS — Z9049 Acquired absence of other specified parts of digestive tract: Secondary | ICD-10-CM

## 2018-06-16 DIAGNOSIS — M7989 Other specified soft tissue disorders: Secondary | ICD-10-CM | POA: Diagnosis not present

## 2018-06-16 DIAGNOSIS — E039 Hypothyroidism, unspecified: Secondary | ICD-10-CM | POA: Diagnosis present

## 2018-06-16 DIAGNOSIS — I10 Essential (primary) hypertension: Secondary | ICD-10-CM | POA: Diagnosis present

## 2018-06-16 DIAGNOSIS — S72002D Fracture of unspecified part of neck of left femur, subsequent encounter for closed fracture with routine healing: Secondary | ICD-10-CM

## 2018-06-16 DIAGNOSIS — E871 Hypo-osmolality and hyponatremia: Secondary | ICD-10-CM | POA: Diagnosis not present

## 2018-06-16 DIAGNOSIS — G9340 Encephalopathy, unspecified: Secondary | ICD-10-CM | POA: Diagnosis not present

## 2018-06-16 DIAGNOSIS — Z85828 Personal history of other malignant neoplasm of skin: Secondary | ICD-10-CM

## 2018-06-16 DIAGNOSIS — R739 Hyperglycemia, unspecified: Secondary | ICD-10-CM

## 2018-06-16 DIAGNOSIS — E8809 Other disorders of plasma-protein metabolism, not elsewhere classified: Secondary | ICD-10-CM | POA: Diagnosis not present

## 2018-06-16 DIAGNOSIS — G301 Alzheimer's disease with late onset: Secondary | ICD-10-CM

## 2018-06-16 DIAGNOSIS — E46 Unspecified protein-calorie malnutrition: Secondary | ICD-10-CM | POA: Diagnosis not present

## 2018-06-16 DIAGNOSIS — D72819 Decreased white blood cell count, unspecified: Secondary | ICD-10-CM | POA: Diagnosis not present

## 2018-06-16 DIAGNOSIS — R0989 Other specified symptoms and signs involving the circulatory and respiratory systems: Secondary | ICD-10-CM

## 2018-06-16 DIAGNOSIS — R8290 Unspecified abnormal findings in urine: Secondary | ICD-10-CM

## 2018-06-16 DIAGNOSIS — I951 Orthostatic hypotension: Secondary | ICD-10-CM

## 2018-06-16 DIAGNOSIS — F02818 Dementia in other diseases classified elsewhere, unspecified severity, with other behavioral disturbance: Secondary | ICD-10-CM

## 2018-06-16 MED ORDER — RISPERIDONE 1 MG PO TABS
1.0000 mg | ORAL_TABLET | Freq: Every day | ORAL | Status: DC
  Administered 2018-06-17 – 2018-06-23 (×7): 1 mg via ORAL
  Filled 2018-06-16 (×7): qty 1

## 2018-06-16 MED ORDER — LEVETIRACETAM 500 MG PO TABS
500.0000 mg | ORAL_TABLET | Freq: Two times a day (BID) | ORAL | Status: DC
  Administered 2018-06-16 – 2018-07-06 (×40): 500 mg via ORAL
  Filled 2018-06-16 (×40): qty 1

## 2018-06-16 MED ORDER — ONDANSETRON HCL 4 MG PO TABS: 4.0000 mg | ORAL_TABLET | Freq: Four times a day (QID) | ORAL | Status: DC | PRN

## 2018-06-16 MED ORDER — ONDANSETRON HCL 4 MG/2ML IJ SOLN: 4.0000 mg | Freq: Four times a day (QID) | INTRAMUSCULAR | Status: DC | PRN

## 2018-06-16 MED ORDER — LEVOTHYROXINE SODIUM 75 MCG PO TABS
75.0000 ug | ORAL_TABLET | Freq: Every day | ORAL | Status: DC
  Administered 2018-06-17 – 2018-07-06 (×20): 75 ug via ORAL
  Filled 2018-06-16 (×20): qty 1

## 2018-06-16 MED ORDER — METHOCARBAMOL 500 MG PO TABS
500.0000 mg | ORAL_TABLET | Freq: Three times a day (TID) | ORAL | Status: DC | PRN
  Administered 2018-06-20: 500 mg via ORAL
  Filled 2018-06-16: qty 1

## 2018-06-16 MED ORDER — ACETAMINOPHEN 325 MG PO TABS
650.0000 mg | ORAL_TABLET | Freq: Four times a day (QID) | ORAL | Status: DC
  Administered 2018-06-16 – 2018-07-06 (×76): 650 mg via ORAL
  Filled 2018-06-16 (×78): qty 2

## 2018-06-16 MED ORDER — POLYETHYLENE GLYCOL 3350 17 G PO PACK
17.0000 g | PACK | Freq: Every day | ORAL | Status: DC | PRN
  Administered 2018-07-01 – 2018-07-03 (×2): 17 g via ORAL
  Filled 2018-06-16: qty 1

## 2018-06-16 MED ORDER — DONEPEZIL HCL 5 MG PO TABS
5.0000 mg | ORAL_TABLET | Freq: Every day | ORAL | Status: DC
  Administered 2018-06-17 – 2018-07-06 (×20): 5 mg via ORAL
  Filled 2018-06-16 (×20): qty 1

## 2018-06-16 MED ORDER — SORBITOL 70 % SOLN
30.0000 mL | Freq: Every day | Status: DC | PRN
  Administered 2018-07-02: 30 mL via ORAL
  Filled 2018-06-16: qty 30

## 2018-06-16 NOTE — Care Management Important Message (Signed)
Important Message  Patient Details  Name: Misty Carroll MRN: [id number] Date of Birth: [birth year]   Medicare Important Message Given:  Yes    Cara Thaxton [address]ine Circle 06/16/2018, 4:01 PM

## 2018-06-16 NOTE — Progress Notes (Signed)
Physical Therapy Treatment Patient Details Name: Misty Carroll MRN: [id number] DOB: Jan 24, 1933 Today's Date: 06/16/2018    History of Present Illness Pt is an 84 y/o female s/p ORIF for L femoral neck fracture. Pt has a past medical history of Cystocele with rectocele, High cholesterol, Hypertension, dementia and Uterovaginal prolapse.     PT Comments    Pt very pleasant, sitting up visiting with daughter on arrival. Pt unaware of fx to LLE and only when she begins to move is she aware of discomfort. Pt with increased gait tolerance today and needs cues for safety and RW use. Pt encouraged to continue mobilizing with nursing daily.    Follow Up Recommendations  SNF;Supervision/Assistance - 24 hour     Equipment Recommendations  Rolling walker with 5" wheels    Recommendations for Other Services       Precautions / Restrictions Precautions Precautions: Fall Restrictions Weight Bearing Restrictions: Yes LLE Weight Bearing: Weight bearing as tolerated    Mobility  Bed Mobility               General bed mobility comments: up in chair on arrival  Transfers Overall transfer level: Needs assistance   Transfers: Sit to/from Stand Sit to Stand: Min guard         General transfer comment: cues for hand placement  Ambulation/Gait Ambulation/Gait assistance: Min assist Gait Distance (Feet): 140 Feet Assistive device: Rolling walker (2 wheeled) Gait Pattern/deviations: Step-to pattern;Antalgic   Gait velocity interpretation: <1.8 ft/sec, indicate of risk for recurrent falls General Gait Details: pt with step to pattern leading with LLE. Short, cautious steps with cues for activity tolerance and chair follow but not needed next session   Stairs             Wheelchair Mobility    Modified Rankin (Stroke Patients Only)       Balance Overall balance assessment: History of Falls   Sitting balance-Leahy Scale: Fair     Standing balance support:  Bilateral upper extremity supported Standing balance-Leahy Scale: Poor Standing balance comment: UE support and assist in initial standing due to posterior bias.                            Cognition Arousal/Alertness: Awake/alert Behavior During Therapy: WFL for tasks assessed/performed Overall Cognitive Status: History of cognitive impairments - at baseline                                        Exercises General Exercises - Lower Extremity Long Arc Quad: AROM;15 reps;Seated;Both Hip Flexion/Marching: AROM;15 reps;Seated;Both    General Comments        Pertinent Vitals/Pain Faces Pain Scale: Hurts little more Pain Location: L leg with weightbearing Pain Descriptors / Indicators: Sore Pain Intervention(s): Limited activity within patient's tolerance;Repositioned;Monitored during session    Home Living                      Prior Function            PT Goals (current goals can now be found in the care plan section) Progress towards PT goals: Progressing toward goals    Frequency    Min 3X/week      PT Plan Current plan remains appropriate;Frequency needs to be updated;Discharge plan needs to be updated    Co-evaluation  AM-PAC PT "6 Clicks" Mobility   Outcome Measure  Help needed turning from your back to your side while in a flat bed without using bedrails?: A Little Help needed moving from lying on your back to sitting on the side of a flat bed without using bedrails?: A Little Help needed moving to and from a bed to a chair (including a wheelchair)?: A Little Help needed standing up from a chair using your arms (e.g., wheelchair or bedside chair)?: A Little Help needed to walk in hospital room?: A Little Help needed climbing 3-5 steps with a railing? : A Lot 6 Click Score: 17    End of Session Equipment Utilized During Treatment: Gait belt Activity Tolerance: Patient tolerated treatment well Patient  left: in chair;with call bell/phone within reach;with family/visitor present Nurse Communication: Mobility status PT Visit Diagnosis: Unsteadiness on feet (R26.81);Pain;Difficulty in walking, not elsewhere classified (R26.2)     Time: 1020-1036 PT Time Calculation (min) (ACUTE ONLY): 16 min  Charges:  $Gait Training: 8-22 mins                     [place], PT Acute Rehabilitation Services Pager: [phone] Office: [place] 06/16/2018, 11:16 AM

## 2018-06-16 NOTE — Progress Notes (Signed)
Report given to [facility] on Rehab unit. Pt. And daughter aware of impending transfer. Pt stable with no  Changes in condition or acute distress. Vital signs stable.   Daughter did state she believed patient was on Lisinopril at home routinely for her BP.   Foley out and patient has voided x1. PO fluids encourage.   Drsg dry and intact.  Simmie Davies RN
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
## 2018-06-16 NOTE — Progress Notes (Signed)
Patient ID: Misty Carroll, female   DOB: 28-Jun-1933, 84 y.o.   MRN: [id number] Patient admitted to [phone] via wheelchair, escorted by nursing staff and family.  Patient and family oriented to unit with emphasis on fall prevention safety plan, personal belongings policy, and visitation policy.  Patient appears to be in no immediate distress at this time.  Brita Romp, RN

## 2018-06-16 NOTE — Discharge Summary (Signed)
  [facility] Discharge Summary   Patient ID: Misty Carroll MRN: [id number] DOB/AGE: 09-Apr-1933 84 y.o.  Admit date: 06/12/2018 Discharge date: 06/16/2018  Admitting Diagnosis: 56 F s/p Fall Small L frontal SAH/SDH L femoral neck fracture L eyebrow lac Seizure HTN HLD Hypothyroidism  Discharge Diagnosis Patient Active Problem List   Diagnosis Date Noted  . Fall 06/13/2018  . Uterine prolapse 01/19/2017  . Cystocele, midline 01/19/2017  . Cystocele with small rectocele and uterine descent 09/03/2016  . Uterovaginal prolapse, incomplete 09/03/2016  . Left-sided epistaxis 01/25/2013    Consultants Orthopedics Neurosurgery Neurology  Imaging: No results found.  Procedures Dr. Stann Mainland (06/13/18) - Open treatment of proximal end of femur, neck with internal fixation   Hospital Course:  Misty Carroll is an 84yo female who presented to [facility] 12/10 after suffering a ground level fall.  Reported seizure activity by her daughter.  Pt denies any LOC.  Pt underwent EDP workup and found to have small SDH/SAH and L femoral neck fracture.  Pt was given Keppra per Dr. Leonel Ramsay of neurology for questionable seizure activity. She was admitted to the trauma service. Orthopedics was consulted for femoral neck fracture and took the patient to the OR later that day for ORIF; advised WBAT LLE. Neurosurgery was consulted for mild traumatic brain injury and recommended monitoring neurological status and repeating head CT scan. Follow up head CT scan 12/11 demonstrates evolution of a left anterior temporal lobe contusion; neurosurgery recommended continued observational care and another follow up head CT scan in 72 hours. Patient worked with therapies during this admission who recommended SNF vs inpatient rehab when medically stable for discharge. Patient's family met with social worker and decided to proceed with inpatient rehab admission. On 12/13 the patient was tolerating diet,  working well with therapies, pain well controlled, vital signs stable and felt stable for discharge to inpatient rehab.  She will have repeat head CT scan tomorrow. Patient will follow up as below and knows to call with questions or concerns.      Follow-up Information    Nicholes Stairs, MD In 2 weeks.   Specialty:  Orthopedic Surgery Why:  For suture removal, For wound re-check Contact information: [address] [place] [address] [phone]        Lorene Dy, MD Follow up.   Specialty:  Internal Medicine Contact information: [address] [place] [address] [phone]           Signed: Wellington Hampshire, [facility] 06/16/2018, 3:02 PM Pager: [phone] Mon 7:00 am -11:30 AM Tues-Fri 7:00 am-4:30 pm Sat-Sun 7:00 am-11:30 am

## 2018-06-16 NOTE — Progress Notes (Signed)
Inpatient Rehabilitation Admissions Coordinator  I met with daughter at bedside to discuss her preference for rehab. Daughter can not provide 24/7 supervision as a caregiver after a brief inpt rehab stay and daughter prefers her Mom to receive SNF rehab in the [place] area which is close to family. I have updated Leonides Grills with [facility] as well as SW, Denyse Amass. We will sign off at this time.  Danne Baxter, RN, MSN Rehab Admissions Coordinator [phone] 06/16/2018 10:21 AM

## 2018-06-16 NOTE — Clinical Social Work Note (Signed)
Clinical Social Worker met with patient and patient daughter to offer support and discuss patient needs at discharge.  Patient daughter had originally requested SNF placement over inpatient rehab so patient could be close to home and family.  CSW provided patient daughter with facility list and explained how the process works.  Patient daughter communicated with her sister and have now decided that they would prefer inpatient rehab and plan to provide 24/7 support to patient upon her discharge from rehab.  CSW very clearly provided information regarding the ability to receive care in only one venue and the possibility of Assisted Living transition with the patient family initiative.  Patient daughter verbalized understanding and appreciation for support and involvement.  Clinical Social Worker will sign off for now as social work intervention is no longer needed. Please consult [place] again if new need arises.  Barbette Or, [place]
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
## 2018-06-16 NOTE — Progress Notes (Addendum)
[facility] Progress Note  3 Days Post-Op  Subjective: CC:  Sitting up in bed, denies pain. States she wants to get up and move around.   Objective: Vital signs in last 24 hours: Temp:  [97.5 F (36.4 C)-98.2 F (36.8 C)] 97.9 F (36.6 C) (12/13 0728) Pulse Rate:  [68-109] 75 (12/13 0728) Resp:  [9-20] 18 (12/13 0728) BP: (148-174)/(63-89) 169/89 (12/13 0728) SpO2:  [87 %-100 %] 95 % (12/13 0728)    Intake/Output from previous day: 12/12 0701 - 12/13 0700 In: 505 [I.V.:505] Out: 1250 [Urine:1250] Intake/Output this shift: No intake/output data recorded.  PE: Gen:  Alert, NAD, cooperative HEENT: left facial contusion stable Card:  Regular rate and rhythm, pedal pulses 2+ BL Pulm:  Normal effort, clear to auscultation bilaterally Abd: Soft, non-tender, non-distended, bowel sounds present  MSK: ortho dressing over proximal left femur, small amt edema as expected, no erythema or drainage. Calves soft bilaterally  Skin: warm and dry, no rashes  Psych: A&Ox3  Neuro: alert, following commands, moving all extremities spontaneously   Lab Results:  Recent Labs    06/14/18 0311 06/15/18 0738  WBC 6.8 7.1  HGB 12.7 11.6*  HCT 40.0 37.5  PLT 112* 134*   BMET Recent Labs    06/14/18 0311  NA 142  K 3.7  CL 104  CO2 28  GLUCOSE 118*  BUN 8  CREATININE 0.74  CALCIUM 8.8*   PT/INR No results for input(s): LABPROT, INR in the last 72 hours. CMP     Component Value Date/Time   NA 142 06/14/2018 0311   NA 132 (L) 11/06/2012 0242   K 3.7 06/14/2018 0311   K 3.7 11/06/2012 0242   CL 104 06/14/2018 0311   CL 96 (L) 11/06/2012 0242   CO2 28 06/14/2018 0311   CO2 30 11/06/2012 0242   GLUCOSE 118 (H) 06/14/2018 0311   GLUCOSE 126 (H) 11/06/2012 0242   BUN 8 06/14/2018 0311   BUN 17 11/06/2012 0242   CREATININE 0.74 06/14/2018 0311   CREATININE 0.71 11/06/2012 0242   CALCIUM 8.8 (L) 06/14/2018 0311   CALCIUM 8.4 (L) 11/06/2012 0242   PROT 6.7  11/17/2016 1833   PROT 6.7 11/05/2012 1835   ALBUMIN 4.0 11/17/2016 1833   ALBUMIN 3.3 (L) 11/05/2012 1835   AST 31 11/17/2016 1833   AST 42 (H) 11/05/2012 1835   ALT 13 (L) 11/17/2016 1833   ALT 23 11/05/2012 1835   ALKPHOS 64 11/17/2016 1833   ALKPHOS 69 11/05/2012 1835   BILITOT 1.0 11/17/2016 1833   BILITOT 0.3 11/05/2012 1835   GFRNONAA >60 06/14/2018 0311   GFRNONAA >60 11/06/2012 0242   GFRAA >60 06/14/2018 0311   GFRAA >60 11/06/2012 0242   Lipase  No results found for: LIPASE     Studies/Results: No results found.  Anti-infectives: Anti-infectives (From admission, onward)   Start     Dose/Rate Route Frequency Ordered Stop   06/14/18 0600  ceFAZolin (ANCEF) IVPB 2g/100 mL premix     2 g 200 mL/hr over 30 Minutes Intravenous On call to O.R. 06/13/18 1537 06/13/18 1700   06/13/18 1600  ceFAZolin (ANCEF) 2-4 GM/100ML-% IVPB    Note to Pharmacy:  [facility]   : cabinet override      06/13/18 1600 06/13/18 1700     Assessment/Plan Fall TBI w/ SAH/SDH - per Dr. Trenton Gammon, plan F/U CT head 12/14 Left femoral neck FX - s/p Open treatment of proximal end of femur, neck with  internal fixation Dr. Stann Mainland 12/10; WBAT, staple removal in 2 weeks, VTE with 81mg  ASA daily x 6 weeks when ok with NS (possibly after next F/U CT H) HTN HLD Hypothyroidism Dementia  Thrombocytopenia - platelets up to 134 ?UTI - WBC WNL, afebrile, trace leuks and large bacteria in UA; CX pending FEN - soft diet ID - off ABX VTE - PAS, chemical VTE held due to [place] - PT/OT, D/C F[place], anticipate CIR pending insurance auth Repeat head CT tomorrow   LOS: 3 days    Obie Dredge, [facility] Pager: [phone]
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
## 2018-06-17 ENCOUNTER — Inpatient Hospital Stay (HOSPITAL_COMMUNITY): Payer: Medicare Other

## 2018-06-17 ENCOUNTER — Inpatient Hospital Stay (HOSPITAL_COMMUNITY): Payer: Medicare Other | Admitting: Speech Pathology

## 2018-06-17 ENCOUNTER — Inpatient Hospital Stay (HOSPITAL_COMMUNITY): Payer: Medicare Other | Admitting: Physical Therapy

## 2018-06-17 DIAGNOSIS — M7989 Other specified soft tissue disorders: Secondary | ICD-10-CM
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
## 2018-06-17 NOTE — Progress Notes (Signed)
Bilateral lower extremity venous duplex completed. Refer to "CV Proc" under chart review to view preliminary results.  06/17/2018 3:39 PM Maudry Mayhew, MHA, RVT, RDCS, RDMS
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
## 2018-06-17 NOTE — Progress Notes (Signed)
Telesitter present in room with floor mats down. Daughter is currently present with patient.   Dyanne Carrel, LPN

## 2018-06-17 NOTE — Progress Notes (Signed)
Pt being transferred to VAS for [place] of LE. Transferred via bed with side rails x 4. Reassured pt she would return back to room once procedure complete. Will f/up when return. Daughter at bedside.   Erie Noe, LPN

## 2018-06-17 NOTE — Progress Notes (Signed)
Pt returned from vascular via bed. Side rails up x 4. Will cont to monitor.   Erie Noe, LPN

## 2018-06-17 NOTE — Progress Notes (Signed)
[facility] PHYSICAL MEDICINE & REHABILITATION PROGRESS NOTE   Subjective/Complaints: Had a good night. Denies pain in left hip. No headaches. Working with SLP when I arrived  ROS: Patient denies fever, rash, sore throat, blurred vision, nausea, vomiting, diarrhea, cough, shortness of breath or chest pain, joint or back pain, headache, or mood change.    Objective:   No results found. Recent Labs    06/15/18 0738  WBC 7.1  HGB 11.6*  HCT 37.5  PLT 134*   No results for input(s): NA, K, CL, CO2, GLUCOSE, BUN, CREATININE, CALCIUM in the last 72 hours.  Intake/Output Summary (Last 24 hours) at 06/17/2018 0940 Last data filed at 06/17/2018 0500 Gross per 24 hour  Intake -  Output 1050 ml  Net -1050 ml     Physical Exam: Vital Signs Blood pressure (!) 159/82, pulse 78, temperature 98.1 F (36.7 C), temperature source Oral, resp. rate 16, height 5\' 5"  (1.651 m), weight 54.5 kg, SpO2 93 %. Constitutional: No distress . Vital signs reviewed. HEENT: EOMI, oral membranes moist Neck: supple Cardiovascular: IRR IRR without murmur. No JVD    Respiratory: CTA Bilaterally without wheezes or rales. Normal effort    GI: BS +, non-tender, non-distended  Neurological: Nocranial nerve deficit. HOH, delays in processing.  UE 4/5 bilaterally. RLE 4/5, LLE 3/5 prox to 4/5 distally. No focal sensory deficits Skin: Skin iswarm. Wound CDIleft hip Psychiatric:  Pleasant and cooperative    Assessment/Plan: 1. Functional deficits secondary to TBI, left fnf  which require 3+ hours per day of interdisciplinary therapy in a comprehensive inpatient rehab setting.  Physiatrist is providing close team supervision and 24 hour management of active medical problems listed below.  Physiatrist and rehab team continue to assess barriers to discharge/monitor patient progress toward functional and medical goals  Care Tool:  Bathing              Bathing assist       Upper Body  Dressing/Undressing Upper body dressing        Upper body assist      Lower Body Dressing/Undressing Lower body dressing            Lower body assist       Toileting Toileting    Toileting assist Assist for toileting: Independent with assistive device Assistive Device Comment: (Front wheel walker to bathroom/BSC)   Transfers Chair/bed transfer  Transfers assist           Locomotion Ambulation   Ambulation assist              Walk 10 feet activity   Assist           Walk 50 feet activity   Assist           Walk 150 feet activity   Assist           Walk 10 feet on uneven surface  activity   Assist           Wheelchair     Assist               Wheelchair 50 feet with 2 turns activity    Assist            Wheelchair 150 feet activity     Assist           Medical Problem List and Plan: 1.Decreased functional mobilitysecondary to traumatic SAH/TBI and left femoral neck fracture. Status post ORIF 12 05/02/2017.  Follow-up cranial CT  scan in process  -Weightbearing as tolerated -beginning therapies 2. DVT Prophylaxis/Anticoagulation: SCDs. Dopplers pending 3. Pain Management:Robaxin as needed 4. Mood:Aricept 5 mg daily, Risperdal 1 mg daily 5. Neuropsych: This patientiscapable of making decisions on herown behalf. 6. Skin/Wound Care:Routine skin checks 7. Fluids/Electrolytes/Nutrition:Routine in and out's with follow-up chemistries MONQY 8. Seizure prophylaxis. Keppra 500 mg twice a day 9. Hypothyroidism. Synthroid 10. Constipation. Laxative assistance    LOS: 1 days A FACE TO FACE EVALUATION WAS PERFORMED  Meredith Staggers 06/17/2018, 9:40 AM
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
## 2018-06-18 NOTE — Progress Notes (Signed)
[facility] PHYSICAL MEDICINE & REHABILITATION PROGRESS NOTE   Subjective/Complaints: Had a good night. Denies pain in left hip. No headaches. Working with SLP when I arrived  ROS: Patient denies fever, rash, sore throat, blurred vision, nausea, vomiting, diarrhea, cough, shortness of breath or chest pain, joint or back pain, headache, or mood change.    Objective:   Ct Head Wo Contrast  Result Date: 06/17/2018 CLINICAL DATA:  Continued surveillance intracranial hemorrhage. Status post fall with hip fracture. EXAM: CT HEAD WITHOUT CONTRAST TECHNIQUE: Contiguous axial images were obtained from the base of the skull through the vertex without intravenous contrast. COMPARISON:  Most recent prior 06/14/2018. FINDINGS: Brain: Interval stability of a delayed intracerebral hematoma, LEFT mid temporal lobe subcortical white matter posttraumatic, compared with most recent priors. Slight increase in surrounding edema. Cross-sectional measurements of 23 x 23 x 21 mm, correspond with an estimated clot volume of 6 mL, although slight subarachnoid hemorrhage into the sylvian fissure reflects the actual volume of blood small BILATERAL subdural hygromas, slightly greater on the RIGHT, mildly flatten the RIGHT frontal cortex. Subarachnoid blood over the cerebellum and midbrain, is improved. Slightly greater. Vascular: No hyperdense vessel or unexpected calcification. Skull: No skull fracture. Sinuses/Orbits: No significant sinus pathology. Negative orbits. Other: None. IMPRESSION: 1. Interval stability of a delayed intracerebral hematoma, LEFT mid temporal lobe subcortical white matter, 23 x 23 x 21 mm. Slight increase in surrounding edema. 2. BILATERAL subdural hygromas, slightly greater on the RIGHT. 3. Continued surveillance is warranted Electronically Signed   By: Staci Righter M.D.   On: 06/17/2018 10:03   Vas [place] Lower Extremity Venous (dvt)  Result Date: 06/17/2018  Lower Venous Study Indications: Swelling.   Limitations: Poor patient compliance. Performing Technologist: Maudry Mayhew MHA, RDMS, RVT, RDCS  Examination Guidelines: A complete evaluation includes B-mode imaging, spectral Doppler, color Doppler, and power Doppler as needed of all accessible portions of each vessel. Bilateral testing is considered an integral part of a complete examination. Limited examinations for reoccurring indications may be performed as noted.  Right Venous Findings: +---------+---------------+---------+-----------+----------+--------------+          CompressibilityPhasicitySpontaneityPropertiesSummary        +---------+---------------+---------+-----------+----------+--------------+ CFV                                                   Not visualized +---------+---------------+---------+-----------+----------+--------------+ SFJ                                                   Not visualized +---------+---------------+---------+-----------+----------+--------------+ FV Prox  Full                                                        +---------+---------------+---------+-----------+----------+--------------+ FV Mid   Full                                                        +---------+---------------+---------+-----------+----------+--------------+  FV DistalFull                                                        +---------+---------------+---------+-----------+----------+--------------+ PFV      Full                                                        +---------+---------------+---------+-----------+----------+--------------+ POP      Full           Yes      Yes                                 +---------+---------------+---------+-----------+----------+--------------+ PTV      Full                                                        +---------+---------------+---------+-----------+----------+--------------+ PERO     Full                                                         +---------+---------------+---------+-----------+----------+--------------+  Left Venous Findings: +---------+---------------+---------+-----------+----------+--------------+          CompressibilityPhasicitySpontaneityPropertiesSummary        +---------+---------------+---------+-----------+----------+--------------+ CFV                                                   Not visualized +---------+---------------+---------+-----------+----------+--------------+ SFJ                                                   Not visualized +---------+---------------+---------+-----------+----------+--------------+ FV Prox  Full                                                        +---------+---------------+---------+-----------+----------+--------------+ FV Mid   Full                                                        +---------+---------------+---------+-----------+----------+--------------+ FV DistalFull                                                        +---------+---------------+---------+-----------+----------+--------------+  PFV      Full                                                        +---------+---------------+---------+-----------+----------+--------------+ POP      Full                                                        +---------+---------------+---------+-----------+----------+--------------+ PTV      Full                                                        +---------+---------------+---------+-----------+----------+--------------+ PERO     Full                                                        +---------+---------------+---------+-----------+----------+--------------+    Summary: Right: There is no evidence of deep vein thrombosis in the lower extremity. However, portions of this examination were limited- see technologist comments above. No cystic structure found in the popliteal fossa. Left:  There is no evidence of deep vein thrombosis in the lower extremity. However, portions of this examination were limited- see technologist comments above. No cystic structure found in the popliteal fossa.  *See table(s) above for measurements and observations. Electronically signed by Deitra Mayo MD on 06/17/2018 at 4:22:34 PM.    Final    No results for input(s): WBC, HGB, HCT, PLT in the last 72 hours. No results for input(s): NA, K, CL, CO2, GLUCOSE, BUN, CREATININE, CALCIUM in the last 72 hours.  Intake/Output Summary (Last 24 hours) at 06/18/2018 1452 Last data filed at 06/18/2018 1347 Gross per 24 hour  Intake 440 ml  Output 1200 ml  Net -760 ml     Physical Exam: Vital Signs Blood pressure (!) 151/85, pulse 89, temperature 98 F (36.7 C), temperature source Oral, resp. rate 16, height 5\' 5"  (1.651 m), weight 54.5 kg, SpO2 94 %. Constitutional: No distress . Vital signs reviewed. HEENT: EOMI, oral membranes moist Neck: supple Cardiovascular: IRR IRR without murmur. No JVD    Respiratory: CTA Bilaterally without wheezes or rales. Normal effort    GI: BS +, non-tender, non-distended  Neurological: Nocranial nerve deficit. HOH, delays in processing.  UE 4/5 bilaterally. RLE 4/5, LLE 3/5 prox to 4/5 distally. No focal sensory deficits Skin: Skin iswarm. Wound CDIleft hip Psychiatric:  Pleasant and cooperative    Assessment/Plan: 1. Functional deficits secondary to TBI, left fnf  which require 3+ hours per day of interdisciplinary therapy in a comprehensive inpatient rehab setting.  Physiatrist is providing close team supervision and 24 hour management of active medical problems listed below.  Physiatrist and rehab team continue to assess barriers to discharge/monitor patient progress toward functional and medical goals  Care Tool:  Bathing    Body parts bathed by patient: Right arm, Left arm,  Chest, Abdomen, Front perineal area, Buttocks, Right upper leg,  Left upper leg, Right lower leg, Left lower leg, Face         Bathing assist Assist Level: Minimal Assistance - Patient > 75%     Upper Body Dressing/Undressing Upper body dressing   What is the patient wearing?: Pull over shirt    Upper body assist Assist Level: Supervision/Verbal cueing    Lower Body Dressing/Undressing Lower body dressing      What is the patient wearing?: Underwear/pull up, Pants     Lower body assist Assist for lower body dressing: Moderate Assistance - Patient 50 - 74%     Toileting Toileting Toileting Activity did not occur Landscape architect and hygiene only): Safety/medical concerns  Toileting assist Assist for toileting: Contact Guard/Touching assist Assistive Device Comment: walker   Transfers Chair/bed transfer  Transfers assist  Chair/bed transfer activity did not occur: Safety/medical concerns  Chair/bed transfer assist level: Minimal Assistance - Patient > 75%     Locomotion Ambulation   Ambulation assist      Assist level: Contact Guard/Touching assist Assistive device: Walker-rolling Max distance: 14   Walk 10 feet activity   Assist     Assist level: Contact Guard/Touching assist Assistive device: Walker-rolling   Walk 50 feet activity   Assist    Assist level: Contact Guard/Touching assist Assistive device: Walker-rolling    Walk 150 feet activity   Assist Walk 150 feet activity did not occur: Safety/medical concerns         Walk 10 feet on uneven surface  activity   Assist Walk 10 feet on uneven surfaces activity did not occur: Safety/medical concerns         Wheelchair     Assist Will patient use wheelchair at discharge?: No   Wheelchair activity did not occur: N/A         Wheelchair 50 feet with 2 turns activity    Assist    Wheelchair 50 feet with 2 turns activity did not occur: N/A       Wheelchair 150 feet activity     Assist Wheelchair 150 feet activity did  not occur: N/A         Medical Problem List and Plan: 1.Decreased functional mobilitysecondary to traumatic SAH/TBI and left femoral neck fracture. Status post ORIF 12 05/02/2017.  Follow-up cranial CT scan 12/14 revealed stable left temporal ICH with increased surrounding edema, and slightly greater subdural hygroma on right than left. ---follow up again over the next week potentially---d/w NS  -Weightbearing as tolerated -Continue CIR therapies including PT, OT SLP 2. DVT Prophylaxis/Anticoagulation: SCDs. Dopplers show no evidence of DVT 3. Pain Management:Robaxin as needed 4. Mood:Aricept 5 mg daily, Risperdal 1 mg daily 5. Neuropsych: This patientiscapable of making decisions on herown behalf. 6. Skin/Wound Care:Routine skin checks 7. Fluids/Electrolytes/Nutrition:Routine in and out's with follow-up chemistries MONQY 8. Seizure prophylaxis. Keppra 500 mg twice a day 9. Hypothyroidism. Synthroid 10. Constipation. Laxative assistance    LOS: 2 days A FACE TO FACE EVALUATION WAS PERFORMED  Meredith Staggers 06/18/2018, 2:52 PM

## 2018-06-19 ENCOUNTER — Inpatient Hospital Stay (HOSPITAL_COMMUNITY): Payer: Medicare Other | Admitting: Physical Therapy

## 2018-06-19 ENCOUNTER — Inpatient Hospital Stay (HOSPITAL_COMMUNITY): Payer: Medicare Other | Admitting: Speech Pathology

## 2018-06-19 ENCOUNTER — Inpatient Hospital Stay (HOSPITAL_COMMUNITY): Payer: Medicare Other | Admitting: Occupational Therapy

## 2018-06-19 DIAGNOSIS — D62 Acute posthemorrhagic anemia: Secondary | ICD-10-CM

## 2018-06-19 DIAGNOSIS — E8809 Other disorders of plasma-protein metabolism, not elsewhere classified: Secondary | ICD-10-CM

## 2018-06-19 DIAGNOSIS — R0989 Other specified symptoms and signs involving the circulatory and respiratory systems: Secondary | ICD-10-CM

## 2018-06-19 DIAGNOSIS — S72145A Nondisplaced intertrochanteric fracture of left femur, initial encounter for closed fracture: Secondary | ICD-10-CM

## 2018-06-19 DIAGNOSIS — E46 Unspecified protein-calorie malnutrition: Secondary | ICD-10-CM

## 2018-06-19 DIAGNOSIS — S065X0D Traumatic subdural hemorrhage without loss of consciousness, subsequent encounter: Secondary | ICD-10-CM

## 2018-06-19 DIAGNOSIS — S72145S Nondisplaced intertrochanteric fracture of left femur, sequela: Secondary | ICD-10-CM

## 2018-06-19 DIAGNOSIS — E876 Hypokalemia: Secondary | ICD-10-CM

## 2018-06-19 LAB — COMPREHENSIVE METABOLIC PANEL
ALT: 29 U/L (ref 0–44)
AST: 18 U/L (ref 15–41)
Albumin: 3.1 g/dL — ABNORMAL LOW (ref 3.5–5.0)
Alkaline Phosphatase: 49 U/L (ref 38–126)
Anion gap: 11 (ref 5–15)
BUN: 15 mg/dL (ref 8–23)
CO2: 29 mmol/L (ref 22–32)
CREATININE: 0.55 mg/dL (ref 0.44–1.00)
Calcium: 8.8 mg/dL — ABNORMAL LOW (ref 8.9–10.3)
Chloride: 100 mmol/L (ref 98–111)
GFR calc Af Amer: >60 mL/min (ref >60)
GFR calc non Af Amer: >60 mL/min (ref >60)
Glucose, Bld: 109 mg/dL — ABNORMAL HIGH (ref 70–99)
Potassium: 3.3 mmol/L — ABNORMAL LOW (ref 3.5–5.1)
Sodium: 140 mmol/L (ref 135–145)
Total Bilirubin: 1 mg/dL (ref 0.3–1.2)
Total Protein: 5.8 g/dL — ABNORMAL LOW (ref 6.5–8.1)

## 2018-06-19 LAB — CBC WITH DIFFERENTIAL/PLATELET
Abs Immature Granulocytes: 0.02 10*3/uL (ref 0.00–0.07)
Basophils Absolute: 0 10*3/uL (ref 0.0–0.1)
Basophils Relative: 0 %
Eosinophils Absolute: 0.1 10*3/uL (ref 0.0–0.5)
Eosinophils Relative: 2 %
HCT: 37 % (ref 36.0–46.0)
HEMOGLOBIN: 11.7 g/dL — AB (ref 12.0–15.0)
Immature Granulocytes: 0 %
Lymphocytes Relative: 27 %
Lymphs Abs: 1.5 10*3/uL (ref 0.7–4.0)
MCH: 29.9 pg (ref 26.0–34.0)
MCHC: 31.6 g/dL (ref 30.0–36.0)
MCV: 94.6 fL (ref 80.0–100.0)
Monocytes Absolute: 0.5 10*3/uL (ref 0.1–1.0)
Monocytes Relative: 9 %
Neutro Abs: 3.4 10*3/uL (ref 1.7–7.7)
Neutrophils Relative %: 62 %
Platelets: 172 10*3/uL (ref 150–400)
RBC: 3.91 MIL/uL (ref 3.87–5.11)
RDW: 13.5 % (ref 11.5–15.5)
WBC: 5.5 10*3/uL (ref 4.0–10.5)
nRBC: 0 % (ref 0.0–0.2)

## 2018-06-19 MED ORDER — POTASSIUM CHLORIDE CRYS ER 20 MEQ PO TBCR
30.0000 meq | EXTENDED_RELEASE_TABLET | Freq: Two times a day (BID) | ORAL | Status: AC
  Administered 2018-06-19 – 2018-06-20 (×4): 30 meq via ORAL
  Filled 2018-06-19 (×4): qty 1

## 2018-06-19 MED ORDER — PRO-STAT SUGAR FREE PO LIQD
30.0000 mL | Freq: Two times a day (BID) | ORAL | Status: DC
  Administered 2018-06-19 – 2018-07-06 (×34): 30 mL via ORAL
  Filled 2018-06-19 (×34): qty 30

## 2018-06-19 NOTE — Progress Notes (Signed)
[facility] PHYSICAL MEDICINE & REHABILITATION PROGRESS NOTE   Subjective/Complaints: Patient seen sitting up in her chair this AM.  She states she slept well overnight.  Per nursing, she had a variable weekend regarding function.  ROS: Denies CP, SOB, N/V/D  Objective:   Vas [place] Lower Extremity Venous (dvt)  Result Date: 06/17/2018  Lower Venous Study Indications: Swelling.  Limitations: Poor patient compliance. Performing Technologist: Maudry Mayhew MHA, RDMS, RVT, RDCS  Examination Guidelines: A complete evaluation includes B-mode imaging, spectral Doppler, color Doppler, and power Doppler as needed of all accessible portions of each vessel. Bilateral testing is considered an integral part of a complete examination. Limited examinations for reoccurring indications may be performed as noted.  Right Venous Findings: +---------+---------------+---------+-----------+----------+--------------+          CompressibilityPhasicitySpontaneityPropertiesSummary        +---------+---------------+---------+-----------+----------+--------------+ CFV                                                   Not visualized +---------+---------------+---------+-----------+----------+--------------+ SFJ                                                   Not visualized +---------+---------------+---------+-----------+----------+--------------+ FV Prox  Full                                                        +---------+---------------+---------+-----------+----------+--------------+ FV Mid   Full                                                        +---------+---------------+---------+-----------+----------+--------------+ FV DistalFull                                                        +---------+---------------+---------+-----------+----------+--------------+ PFV      Full                                                         +---------+---------------+---------+-----------+----------+--------------+ POP      Full           Yes      Yes                                 +---------+---------------+---------+-----------+----------+--------------+ PTV      Full                                                        +---------+---------------+---------+-----------+----------+--------------+  PERO     Full                                                        +---------+---------------+---------+-----------+----------+--------------+  Left Venous Findings: +---------+---------------+---------+-----------+----------+--------------+          CompressibilityPhasicitySpontaneityPropertiesSummary        +---------+---------------+---------+-----------+----------+--------------+ CFV                                                   Not visualized +---------+---------------+---------+-----------+----------+--------------+ SFJ                                                   Not visualized +---------+---------------+---------+-----------+----------+--------------+ FV Prox  Full                                                        +---------+---------------+---------+-----------+----------+--------------+ FV Mid   Full                                                        +---------+---------------+---------+-----------+----------+--------------+ FV DistalFull                                                        +---------+---------------+---------+-----------+----------+--------------+ PFV      Full                                                        +---------+---------------+---------+-----------+----------+--------------+ POP      Full                                                        +---------+---------------+---------+-----------+----------+--------------+ PTV      Full                                                         +---------+---------------+---------+-----------+----------+--------------+ PERO     Full                                                        +---------+---------------+---------+-----------+----------+--------------+  Summary: Right: There is no evidence of deep vein thrombosis in the lower extremity. However, portions of this examination were limited- see technologist comments above. No cystic structure found in the popliteal fossa. Left: There is no evidence of deep vein thrombosis in the lower extremity. However, portions of this examination were limited- see technologist comments above. No cystic structure found in the popliteal fossa.  *See table(s) above for measurements and observations. Electronically signed by Deitra Mayo MD on 06/17/2018 at 4:22:34 PM.    Final    Recent Labs    06/19/18 0511  WBC 5.5  HGB 11.7*  HCT 37.0  PLT 172   Recent Labs    06/19/18 0511  NA 140  K 3.3*  CL 100  CO2 29  GLUCOSE 109*  BUN 15  CREATININE 0.55  CALCIUM 8.8*    Intake/Output Summary (Last 24 hours) at 06/19/2018 1004 Last data filed at 06/18/2018 2338 Gross per 24 hour  Intake 440 ml  Output 100 ml  Net 340 ml     Physical Exam: Vital Signs Blood pressure (!) 159/86, pulse 79, temperature (!) 97.3 F (36.3 C), temperature source Oral, resp. rate 16, height 5\' 5"  (1.651 m), weight 54.5 kg, SpO2 95 %. Constitutional: No distress . Vital signs reviewed. HENT: Normocephalic.  Atraumatic. Eyes: EOMI. No discharge. Cardiovascular: Irregularly irregular. No JVD. Respiratory: CTA Bilaterally. Normal effort. GI: BS +. Non-distended. Musc: No edema or tenderness in extremities. Neurological: Nocranial nerve deficit. HOH Delays in processing.   Motor: Bilateral UE 4+/5 proximal to distal RLE: 4/5 proximal to distal LLE: 4-/5 prox to distal.  No focal sensory deficits Skin: Skin iswarm. Wound CDIleft hip Psychiatric: Pleasant and  cooperative  Assessment/Plan: 1. Functional deficits secondary to TBI, left fnf  which require 3+ hours per day of interdisciplinary therapy in a comprehensive inpatient rehab setting.  Physiatrist is providing close team supervision and 24 hour management of active medical problems listed below.  Physiatrist and rehab team continue to assess barriers to discharge/monitor patient progress toward functional and medical goals  Care Tool:  Bathing    Body parts bathed by patient: Right arm, Left arm, Chest, Abdomen, Front perineal area, Buttocks, Right upper leg, Left upper leg, Right lower leg, Left lower leg, Face         Bathing assist Assist Level: Minimal Assistance - Patient > 75%     Upper Body Dressing/Undressing Upper body dressing   What is the patient wearing?: Pull over shirt    Upper body assist Assist Level: Supervision/Verbal cueing    Lower Body Dressing/Undressing Lower body dressing      What is the patient wearing?: Underwear/pull up, Pants     Lower body assist Assist for lower body dressing: Moderate Assistance - Patient 50 - 74%     Toileting Toileting Toileting Activity did not occur Landscape architect and hygiene only): Safety/medical concerns  Toileting assist Assist for toileting: Independent with assistive device Assistive Device Comment: walker   Transfers Chair/bed transfer  Transfers assist  Chair/bed transfer activity did not occur: Safety/medical concerns  Chair/bed transfer assist level: Minimal Assistance - Patient > 75%     Locomotion Ambulation   Ambulation assist      Assist level: Contact Guard/Touching assist Assistive device: Walker-rolling Max distance: 14   Walk 10 feet activity   Assist     Assist level: Contact Guard/Touching assist Assistive device: Walker-rolling   Walk 50 feet activity   Assist    Assist level: Contact  Guard/Touching assist Assistive device: Walker-rolling    Walk 150 feet  activity   Assist Walk 150 feet activity did not occur: Safety/medical concerns         Walk 10 feet on uneven surface  activity   Assist Walk 10 feet on uneven surfaces activity did not occur: Safety/medical concerns         Wheelchair     Assist Will patient use wheelchair at discharge?: No   Wheelchair activity did not occur: N/A         Wheelchair 50 feet with 2 turns activity    Assist    Wheelchair 50 feet with 2 turns activity did not occur: N/A       Wheelchair 150 feet activity     Assist Wheelchair 150 feet activity did not occur: N/A         Medical Problem List and Plan: 1.Decreased functional mobilitysecondary to traumatic SAH/TBI and left femoral neck fracture. Status post ORIF 12 05/02/2017.  Continue CIR  Notes reviewed- fall with polytrauma, [facility], labs reviewed  Follow-up cranial CT scan 12/14 discussed with neurosurgery, continue to follow with repeat scan at the end of the week  Weightbearing as tolerated 2. DVT Prophylaxis/Anticoagulation: SCDs. Dopplers limited, but no DVT noted 3. Pain Management:Robaxin as needed 4. Mood:Aricept 5 mg daily, Risperdal 1 mg daily 5. Neuropsych: This patientis ?fully capable of making decisions on herown behalf. 6. Skin/Wound Care:Routine skin checks 7. Fluids/Electrolytes/Nutrition:Routine in and out's 8. Seizure prophylaxis. Keppra 500 mg twice a day 9. Hypothyroidism. Synthroid 10. Constipation. Laxative assistance 11.  Labile blood pressure  Labile on 12/16  Continue to monitor 12.  Hypokalemia  Potassium 3.3 on 12/16  Supplemented x2 days  Continue to monitor 13.  Acute blood loss anemia  Hemoglobin 11.7 on 12/16  Continue to monitor 14.  Hypoalbuminemia  Supplement initiated on 12/16   LOS: 3 days A FACE TO FACE EVALUATION WAS PERFORMED   Lorie Phenix 06/19/2018, 10:04 AM
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
## 2018-06-19 NOTE — IPOC Note (Signed)
Overall Plan of Care [facility]) Patient Details Name: Misty Carroll MRN: [id number] DOB: May 21, 1933  Admitting Diagnosis: Polytrauma with TBI  Hospital Problems: Active Problems:   Traumatic subdural hematoma ([name])     Functional Problem List: Nursing Bowel, Endurance, Motor, Medication Management, Pain, Safety, Skin Integrity  PT Behavior, Balance, Motor, Endurance, Perception, Safety  OT Balance, Cognition, Endurance, Pain, Safety, Perception  SLP Cognition  TR         Basic ADL's: OT Grooming, Bathing, Toileting, Dressing     Advanced  ADL's: OT       Transfers: PT Bed Mobility, Bed to Chair, Car, Furniture, Futures trader, Metallurgist: PT Stairs, Ambulation     Additional Impairments: OT    SLP Social Cognition, Communication expression Memory, Problem Solving  TR      Anticipated Outcomes Item Anticipated Outcome  Self Feeding S  Swallowing      Basic self-care  S  Toileting  S   Bathroom Transfers S  Bowel/Bladder  Min assist  Transfers  supervision w/ LRAD  Locomotion  supervision community gait w/ LRAD  Communication  Min A   Cognition  Mod A  Pain  < 3  Safety/Judgment  Min assist   Therapy Plan: PT Intensity: Minimum of 1-2 x/day ,45 to 90 minutes PT Frequency: 5 out of 7 days PT Duration Estimated Length of Stay: 7-10 days OT Intensity: Minimum of 1-2 x/day, 45 to 90 minutes OT Frequency: 5 out of 7 days OT Duration/Estimated Length of Stay: 7-10 SLP Intensity: Minumum of 1-2 x/day, 30 to 90 minutes SLP Frequency: 3 to 5 out of 7 days SLP Duration/Estimated Length of Stay: 7-10 days     Team Interventions: Nursing Interventions Patient/Family Education, Bowel Management, Disease Management/Prevention, Cognitive Remediation/Compensation, Skin Care/Wound Management, Medication Management, Pain Management, Discharge Planning  PT interventions UE/LE Strength taining/ROM, Stair training, Psychosocial support,  Neuromuscular re-education, DME/adaptive equipment instruction, Academic librarian, Ambulation/gait training, UE/LE Coordination activities, Wheelchair propulsion/positioning, Therapeutic Activities, Pain management, Discharge planning, Training and development officer, Skin care/wound management, Cognitive remediation/compensation, Disease management/prevention, Functional mobility training, Patient/family education, Splinting/orthotics, Visual/perceptual remediation/compensation, Therapeutic Exercise  OT Interventions Training and development officer, Community reintegration, Disease mangement/prevention, Neuromuscular re-education, Patient/family education, Self Care/advanced ADL retraining, Splinting/orthotics, Therapeutic Exercise, UE/LE Coordination activities, Cognitive remediation/compensation, Discharge planning, DME/adaptive equipment instruction, Functional mobility training, Pain management, Psychosocial support, Skin care/wound managment, Therapeutic Activities, UE/LE Strength taining/ROM  SLP Interventions Cognitive remediation/compensation, Cueing hierarchy, Functional tasks, Environmental controls, Internal/external aids, Speech/Language facilitation, Patient/family education, Therapeutic Activities  TR Interventions    SW/CM Interventions     Barriers to Discharge MD  Medical stability  Nursing      PT Decreased caregiver support, Lack of/limited family support, Behavior cognitive impairments may affect safe d/c to home w/o assist (as pt and family would like), daughters may be unable to provide 24/7 assist  OT Decreased caregiver support, Lack of/limited family support unsure if family can provide 24/7 supervision at d/c  SLP      SW       Team Discharge Planning: Destination: PT-Home(SNF if family unable to provide 24/7 supervision) ,OT- Home , SLP-Home Projected Follow-up: PT-Home health PT, OT-  24 hour supervision/assistance, Home health OT, SLP-24 hour supervision/assistance,  Home Health SLP, Outpatient SLP Projected Equipment Needs: PT-To be determined, OT- To be determined, SLP-None recommended by SLP Equipment Details: PT- , OT-  Patient/family involved in discharge planning: PT- Patient, Family member/caregiver,  OT-Patient, SLP-Patient  MD ELOS: 7-10 days. Medical Rehab  Prognosis:  Good Assessment: 84 year old right-handed female with history of hypertension and memory loss maintained on Aricept as well as Risperdal. Presented 06/13/2018. Patient presented after reported fall that was unwitnessed. Patient contacted her daughter who arrived at the home and patient came to the door to let her in. Cranial CT scan showed small left frontal subdural hemorrhage as well as trace subarachnoid hemorrhage in the left sylvian fissure. CT cervical spine negative. Troponin negative. X-rays and imaging of left hip showed nondisplaced fracture of left femoral neck with mild impaction. No dislocation. Neurosurgery follow-up Dr. Annette Stable advise conservative care for [facility]. Follow-up cranial CT scan of the head 06/14/2018 showed interval increase in the subarachnoid hemorrhage within the left sylvian fissure and interval extension into the left parietal occipital lobes no midline shift. Maintained on Keppra for seizure prophylaxis. Underwent ORIF left hip fracture 06/13/2018 per Dr. Victorino December. Weightbearing as tolerated left lower extremity. Hospital course pain management. Patient with resulting functional deficits with mobility, endurance, cognition.  Will set goals for supervision with PT/OT and Min/Mod A with SLP.  See Team Conference Notes for weekly updates to the plan of care

## 2018-06-19 NOTE — Care Management Note (Signed)
[facility] Individual Statement of Services  Patient Name:  MALAYSHIA ALL  Date:  06/19/2018  Welcome to the [facility].  Our goal is to provide you with an individualized program based on your diagnosis and situation, designed to meet your specific needs.  With this comprehensive rehabilitation program, you will be expected to participate in at least 3 hours of rehabilitation therapies Monday-Friday, with modified therapy programming on the weekends.  Your rehabilitation program will include the following services:  Physical Therapy (PT), Occupational Therapy (OT), Speech Therapy (ST), 24 hour per day rehabilitation nursing, Case Management (Social Worker), Rehabilitation Medicine, Nutrition Services and Pharmacy Services  Weekly team conferences will be held on Wednesday to discuss your progress.  Your Social Worker will talk with you frequently to get your input and to update you on team discussions.  Team conferences with you and your family in attendance may also be held.  Expected length of stay: 7-10 days  Overall anticipated outcome: supervision with cueing  Depending on your progress and recovery, your program may change. Your Social Worker will coordinate services and will keep you informed of any changes. Your Social Worker's name and contact numbers are listed  below.  The following services may also be recommended but are not provided by the [facility]:    [place] will be made to provide these services after discharge if needed.  Arrangements include referral to agencies that provide these services.  Your insurance has been verified to be:  Medicare & Aetna Your primary doctor is:  Lorene Dy  Pertinent information will be shared with your doctor and your insurance company.  Social Worker:  Ovidio Kin, [place] or ([phone]  Information discussed with and copy given to patient by: Elease Hashimoto, 06/19/2018, 10:41 AM

## 2018-06-19 NOTE — Discharge Instructions (Signed)
Inpatient Rehab Discharge Instructions  Misty Carroll Discharge date and time: No discharge date for patient encounter.   Activities/Precautions/ Functional Status: Activity: weightbearing as tolerated Diet: regular diet Wound Care: keep wound clean and dry Functional status:  ___ No restrictions     ___ Walk up steps independently ___ 24/7 supervision/assistance   ___ Walk up steps with assistance ___ Intermittent supervision/assistance  ___ Bathe/dress independently ___ Walk with walker     ___ Bathe/dress with assistance ___ Walk Independently    ___ Shower independently ___ Walk with assistance    ___ Shower with assistance ___ No alcohol     ___ Return to work/school ________  Special Instructions:  No driving  Supervision for safety  My questions have been answered and I understand these instructions. I will adhere to these goals and the provided educational materials after my discharge from the hospital.  Patient/Caregiver Signature _______________________________ Date __________  Clinician Signature _______________________________________ Date __________  Please bring this form and your medication list with you to all your follow-up doctor's appointments.

## 2018-06-19 NOTE — Progress Notes (Signed)
   06/19/18 1000  Clinical Encounter Type  Visited With Patient not available;Health care provider  Visit Type Initial;Other (Comment) (AD request)  Referral From Nurse   Responded to Epic consult request for AD.  Pt not in room, no friends/family present either.  Spoke w/ LPN assigned to pt, pt is at PT and is confused, has dementia and not in a place to make legal decisions at this time.  Therefore, pt is not appropriate for AD at this time.  Decision cannot be made by anyone other than competent pt.  Myra Gianotti resident, [phone]

## 2018-06-19 NOTE — Progress Notes (Signed)
Physical Therapy Session Note  Patient Details  Name: Misty Carroll MRN: [id number] Date of Birth: May 05, 1933  Today's Date: 06/19/2018 PT Individual Time: 1300-1400 PT Individual Time Calculation (min): 60 min   Short Term Goals: Week 1:  PT Short Term Goal 1 (Week 1): =LTGs due to ELOS  Skilled Therapeutic Interventions/Progress Updates:    Patient received in recliner, very antsy and anxious to get out of chair and walk. Able to perform functional transfers with min guard and RW, and performed toileting with min guard and cues for safety. She was able to gait train distances of 261f with RW and min guard for safety/Mod cues for safety however is often unsafe with RW, pushing it to the side or forgetting to use it completely during transfers requiring Max cues for safety. VC provided for safe walker use and improved step length R/stance time L LE during gait. Tolerated riding Nustep for 7 minutes with B LEs on level 3 for strength and endurance training, then worked on strengthening for hip extensors, flexors, and abductors and B quads on mat table with Mod cues for form. Incorporated cognitive activity while ambulating back to her room with min guard but Max cues to locate correct room and use signs on wall. She was left up in recliner with seat belt alarm active, all needs met, and daughter present.   Therapy Documentation Precautions:  Precautions Precautions: Fall Restrictions Weight Bearing Restrictions: Yes LLE Weight Bearing: Weight bearing as tolerated General:   Vital Signs:  Pain: Pain Assessment Pain Scale: 0-10 Pain Score: 5  Pain Type: Surgical pain Pain Location: Hip Pain Orientation: Left Pain Descriptors / Indicators: Aching;Sore Pain Onset: On-going Patients Stated Pain Goal: 0 Pain Intervention(s): Ambulation/increased activity;Environmental changes;Repositioned Multiple Pain Sites: No    Therapy/Group: Individual Therapy  KDeniece ReePT, DPT,  CBIS  [facility]   Pager [phone] Rehab Office [phone]  06/19/2018, 3:44 PM

## 2018-06-19 NOTE — Progress Notes (Signed)
Physical Therapy Session Note  Patient Details  Name: Misty Carroll MRN: [id number] Date of Birth: September 06, 1932  Today's Date: 06/19/2018 PT Individual Time: 1015-1045 PT Individual Time Calculation (min): 30 min   Short Term Goals: Week 1:  PT Short Term Goal 1 (Week 1): =LTGs due to ELOS  Skilled Therapeutic Interventions/Progress Updates:    Patient received up in recliner with daughter present, pleasant and willing to participate in therapy today. Able to complete transfers with min guard/Max cues for safety, and gait trained multiple distances of 188f with RW and min guard, cues to reduce L drift in hallway. Practiced car transfers, picking up items from floor, and stairs, all of which patient was able to perform with min guard and reciprocal pattern on stairs. TUG 26 seconds with RW, 5x STS test 21 seconds with MinA due to LE weakness. She was able to ambulate back to her room with RW and min guard and was left up in recliner with all needs met and seat belt alarm active, daughter present.   Therapy Documentation Precautions:  Precautions Precautions: Fall Restrictions Weight Bearing Restrictions: Yes LLE Weight Bearing: Weight bearing as tolerated General:   Vital Signs:  Pain: Pain Assessment Pain Scale: 0-10 Pain Score: 0-No pain    Therapy/Group: Individual Therapy  KDeniece ReePT, DPT, CBIS  [facility]   Pager [phone] Rehab Office [phone]  06/19/2018, 12:39 PM

## 2018-06-19 NOTE — Progress Notes (Signed)
Occupational Therapy Session Note  Patient Details  Name: Misty Carroll MRN: [id number] Date of Birth: 1933-03-02  Today's Date: 06/19/2018 OT Individual Time: 1415-1455 OT Individual Time Calculation (min): 40 min    Short Term Goals: Week 1:  OT Short Term Goal 1 (Week 1): n/a d/t ELOS  Skilled Therapeutic Interventions/Progress Updates:    Upon entering the room, pt seated in recliner chair with no c/o pain and daughter present in room. Pt standing from recliner chair with supervision and ambulating with min guard into bathroom with RW. Pt performed clothing management and hygiene with min guard for standing balance and needing min cuing for safe use with the RW. Pt performed hand hygiene in standing as well with min cuing for sequencing. Pt ambulating 100' with RW to ADL apartment with min guard. Pt taking seated rest break on recliner chair similar to one in home environment. Pt needing mod lifting assistance to stand from low seat and returning to room secondary to pt reporting increase pain in L LE hip and knee. RN notified and ice applied with pt seated in recliner chair. Chair alarm donned for safety with call bell within reach.   Therapy Documentation Precautions:  Precautions Precautions: Fall Restrictions Weight Bearing Restrictions: Yes LLE Weight Bearing: Weight bearing as tolerated  Pain: Pain Assessment Pain Scale: 0-10 Pain Score: 0-No pain ADL: ADL Grooming: Contact guard Where Assessed-Grooming: Standing at sink Upper Body Bathing: Moderate cueing Where Assessed-Upper Body Bathing: Sitting at sink Lower Body Bathing: Moderate cueing, Contact guard Where Assessed-Lower Body Bathing: Sitting at sink, Standing at sink Upper Body Dressing: Minimal cueing Where Assessed-Upper Body Dressing: Sitting at sink Lower Body Dressing: Moderate assistance Where Assessed-Lower Body Dressing: Standing at sink Toileting: Minimal assistance Where Assessed-Toileting: Bedside  Commode Toilet Transfer: Minimal assistance Toilet Transfer Method: Stand pivot Toilet Transfer Equipment: Bedside commode    Therapy/Group: Individual Therapy  Gypsy Decant 06/19/2018, 2:57 PM

## 2018-06-19 NOTE — Progress Notes (Signed)
Speech Language Pathology Daily Session Note  Patient Details  Name: Misty Carroll MRN: [id number] Date of Birth: 09-17-1932  Today's Date: 06/19/2018 SLP Individual Time: 1100-1200 SLP Individual Time Calculation (min): 60 min  Short Term Goals: Week 1: SLP Short Term Goal 1 (Week 1): STGs=LTGs  Skilled Therapeutic Interventions:  Skilled treatment session focused on cognition goals. SLP facilitated session by reorienting pt to hospital and policies such as lap alarm. Pt easily reoriented and very willing to participate in therapy. SLP further facilitated session by providing Max A cues to complete basic puzzle. Pt also required Max A to recall biographical information about herself and her children. Pt was left upright in recliner, lap alarm on and all needs within reach. Pt's daughter present.      Pain Pain Assessment Pain Scale: 0-10 Pain Score: 0-No pain  Therapy/Group: Individual Therapy  [name] 06/19/2018, 12:05 PM

## 2018-06-20 ENCOUNTER — Inpatient Hospital Stay (HOSPITAL_COMMUNITY): Payer: Medicare Other | Admitting: Physical Therapy

## 2018-06-20 ENCOUNTER — Inpatient Hospital Stay (HOSPITAL_COMMUNITY): Payer: Medicare Other | Admitting: Speech Pathology

## 2018-06-20 ENCOUNTER — Inpatient Hospital Stay (HOSPITAL_COMMUNITY): Payer: Medicare Other

## 2018-06-20 ENCOUNTER — Inpatient Hospital Stay (HOSPITAL_COMMUNITY): Payer: Medicare Other | Admitting: Occupational Therapy

## 2018-06-20 DIAGNOSIS — G479 Sleep disorder, unspecified: Secondary | ICD-10-CM

## 2018-06-20 MED ORDER — NALOXONE HCL 0.4 MG/ML IJ SOLN
0.4000 mg | Freq: Once | INTRAMUSCULAR | Status: DC
  Filled 2018-06-20 (×2): qty 1

## 2018-06-20 MED ORDER — TRAMADOL HCL 50 MG PO TABS
50.0000 mg | ORAL_TABLET | Freq: Four times a day (QID) | ORAL | Status: DC | PRN
  Administered 2018-06-20: 50 mg via ORAL
  Filled 2018-06-20: qty 1

## 2018-06-20 MED ORDER — TRAMADOL HCL 50 MG PO TABS: 50.0000 mg | ORAL_TABLET | Freq: Four times a day (QID) | ORAL | Status: DC

## 2018-06-20 MED ORDER — SODIUM CHLORIDE 0.45 % IV SOLN
INTRAVENOUS | Status: DC
  Administered 2018-06-20 – 2018-06-22 (×5): via INTRAVENOUS

## 2018-06-20 MED ORDER — TRAZODONE HCL 50 MG PO TABS: 25.0000 mg | ORAL_TABLET | Freq: Every day | ORAL | Status: DC

## 2018-06-20 NOTE — Progress Notes (Signed)
[facility] PHYSICAL MEDICINE & REHABILITATION PROGRESS NOTE   Subjective/Complaints: Patient seen sitting up in bed this morning.  Per report, she did not sleep well overnight.  ROS: Limited due to cognition  Objective:   No results found. Recent Labs    06/19/18 0511  WBC 5.5  HGB 11.7*  HCT 37.0  PLT 172   Recent Labs    06/19/18 0511  NA 140  K 3.3*  CL 100  CO2 29  GLUCOSE 109*  BUN 15  CREATININE 0.55  CALCIUM 8.8*    Intake/Output Summary (Last 24 hours) at 06/20/2018 0841 Last data filed at 06/20/2018 0700 Gross per 24 hour  Intake 660 ml  Output -  Net 660 ml     Physical Exam: Vital Signs Blood pressure (!) 163/91, pulse 83, temperature 98.3 F (36.8 C), temperature source Oral, resp. rate 18, height 5\' 5"  (1.651 m), weight 54.5 kg, SpO2 96 %. Constitutional: No distress . Vital signs reviewed. HENT: Normocephalic.  Atraumatic. Eyes: EOMI. No discharge. Cardiovascular: Irregularly irregular.  No JVD. Respiratory: CTA bilaterally.  Normal effort. GI: BS +. Non-distended. Musc: No edema or tenderness in extremities. Neurological: Alert and oriented x1 HOH Delays in processing.   Motor: Bilateral UE 4+/5 proximal to distal RLE: 4/5 proximal to distal LLE: 4-/5 prox to distal.  No focal sensory deficits Skin: Skin iswarm. Wound CDIleft hip Psychiatric: Confused and tangential  Assessment/Plan: 1. Functional deficits secondary to TBI, left fnf  which require 3+ hours per day of interdisciplinary therapy in a comprehensive inpatient rehab setting.  Physiatrist is providing close team supervision and 24 hour management of active medical problems listed below.  Physiatrist and rehab team continue to assess barriers to discharge/monitor patient progress toward functional and medical goals  Care Tool:  Bathing    Body parts bathed by patient: Right arm, Left arm, Chest, Abdomen, Front perineal area, Buttocks, Right upper leg, Left upper  leg, Right lower leg, Left lower leg, Face         Bathing assist Assist Level: Minimal Assistance - Patient > 75%     Upper Body Dressing/Undressing Upper body dressing   What is the patient wearing?: Pull over shirt    Upper body assist Assist Level: Supervision/Verbal cueing    Lower Body Dressing/Undressing Lower body dressing      What is the patient wearing?: Underwear/pull up, Pants     Lower body assist Assist for lower body dressing: Moderate Assistance - Patient 50 - 74%     Toileting Toileting Toileting Activity did not occur Landscape architect and hygiene only): Safety/medical concerns  Toileting assist Assist for toileting: Independent with assistive device Assistive Device Comment: walker   Transfers Chair/bed transfer  Transfers assist  Chair/bed transfer activity did not occur: Safety/medical concerns  Chair/bed transfer assist level: Contact Guard/Touching assist     Locomotion Ambulation   Ambulation assist      Assist level: Contact Guard/Touching assist Assistive device: Walker-rolling Max distance: 146ft   Walk 10 feet activity   Assist     Assist level: Contact Guard/Touching assist Assistive device: Walker-rolling   Walk 50 feet activity   Assist    Assist level: Contact Guard/Touching assist Assistive device: Walker-rolling    Walk 150 feet activity   Assist Walk 150 feet activity did not occur: Safety/medical concerns  Assist level: Contact Guard/Touching assist Assistive device: Walker-rolling    Walk 10 feet on uneven surface  activity   Assist Walk 10 feet on uneven  surfaces activity did not occur: Safety/medical concerns         Wheelchair     Assist Will patient use wheelchair at discharge?: No   Wheelchair activity did not occur: N/A         Wheelchair 50 feet with 2 turns activity    Assist    Wheelchair 50 feet with 2 turns activity did not occur: N/A       Wheelchair  150 feet activity     Assist Wheelchair 150 feet activity did not occur: N/A         Medical Problem List and Plan: 1.Decreased functional mobilitysecondary to traumatic SAH/TBI and left femoral neck fracture. Status post ORIF 12 05/02/2017.  Continue CIR  Follow-up cranial CT scan 12/14 discussed with neurosurgery, continue to follow with repeat scan at the end of the week or sooner if concern for decline  Weightbearing as tolerated 2. DVT Prophylaxis/Anticoagulation: SCDs. Dopplers limited, but no DVT noted 3. Pain Management:Robaxin as needed 4. Mood:Aricept 5 mg daily, Risperdal 1 mg daily 5. Neuropsych: This patientis ?fully capable of making decisions on herown behalf. 6. Skin/Wound Care:Routine skin checks 7. Fluids/Electrolytes/Nutrition:Routine in and out's 8. Seizure prophylaxis. Keppra 500 mg twice a day 9. Hypothyroidism. Synthroid 10. Constipation. Laxative assistance 11.  Labile blood pressure  ?  Trending up, will consider medications tomorrow if persistently elevated  Continue to monitor 12.  Hypokalemia  Potassium 3.3 on 12/16  Supplemented x2 days  Continue to monitor 13.  Acute blood loss anemia  Hemoglobin 11.7 on 12/16  Continue to monitor 14.  Hypoalbuminemia  Supplement initiated on 12/16 15.  Sleep disturbance  EKG on 12/11 reviewed, trazodone started on 12/17   LOS: 4 days A FACE TO FACE EVALUATION WAS PERFORMED  Chasten Blaze Lorie Phenix 06/20/2018, 8:41 AM

## 2018-06-20 NOTE — Progress Notes (Signed)
Physical Therapy Session Note  Patient Details  Name: Misty Carroll MRN: [id number] Date of Birth: 31-Dec-1932  Today's Date: 06/20/2018 PT Individual Time: 0800-0900 and 1515-1545 PT Individual Time Calculation (min): 60 min and 30 min (total 90 min)   Short Term Goals: Week 1:  PT Short Term Goal 1 (Week 1): =LTGs due to ELOS  Skilled Therapeutic Interventions/Progress Updates: Tx 1: Pt received seated in bed, c/o pain in L hip and RN alerted who arrived during session to administer medication. Supine>sit with HOB elevated. Gait in/out of bathroom with RW and min guard, S for clothing management and hygiene. Hygiene at sink with S, min cues for locating needed items and sequencing. Gait 2x50' with RW and min guard; limited by LE pain. W/c propulsion BUE for strengthening, endurance, command following and learning novel task; requires hand over hand initially faded to S with slow speed. Stand pivot no AD min guard to mat. Standing balance on blue wedge while performing card matching task; pt unwilling to stand on wedge d/t sense of posterior LOB; performed remainder of activity with small part of forefoot on wedge. Became frustrated with activity and returned to sitting; agreeable with encouragement to place 3 additional cards from sitting; mod cues to correctly identify placement. Gait to return to room x50, x150' with min guard, slow speed. Remained seated in recliner at end of session, lap belt alarm and telesitting intact, all needs in reach.   Tx 2: Pt received in recliner with daughter present finishing lunch; deferred session 15 min to allow pt to finish eating. Upon therapists return pt agreeable to treatment. Gait to gym with RW and min guard x150'. Performed ascent/descent 8 steps 6" height with B handrails and reciprocal pattern with min guard. Gait weaving around cones with min guard, moderate cueing for following commands for alternating direction, improved with repetition. Returned to  room with min guard gait with RW. Remained in recliner at end of session, lap belt alarm and telesitter intact, daughter present and all needs in reach.      Therapy Documentation Precautions:  Precautions Precautions: Fall Restrictions Weight Bearing Restrictions: Yes LLE Weight Bearing: Weight bearing as tolerated    Therapy/Group: Individual Therapy  Corliss Skains 06/20/2018, 8:58 AM

## 2018-06-20 NOTE — Progress Notes (Signed)
Speech Language Pathology Daily Session Note  Patient Details  Name: Misty Carroll MRN: [id number] Date of Birth: 1933-02-04  Today's Date: 06/20/2018 SLP Individual Time: 5027-7412 SLP Individual Time Calculation (min): 43 min  Short Term Goals: Week 1: SLP Short Term Goal 1 (Week 1): STGs=LTGs  Skilled Therapeutic Interventions:  Pt was seen for skilled ST targeting cognitive goals.  Upon arrival, pt was seated upright in recliner with complaints of knee pain and she reported she did not want to do anything.  Pt was only oriented to self and was unable to reorient despite max cues/choice of three.  SLP posted signs in pt's room to facilitate orientation to place, date, and situation which pt was able to read with supervision cues but demonstrated poor carryover of information.  Pt was unable to be redirected to any other meaningful activity for therapy due to distraction to pain.  Pt was left in recliner at nursing station.  RN made aware.  Session was ended early due to pt refusal.  Continue per current plan of care.    Pain Pain Assessment Pain Scale: Faces Pain Score: 4  Faces Pain Scale: Hurts even more Pain Type: Acute pain Pain Location: Knee Pain Descriptors / Indicators: Aching Pain Intervention(s): Other (Comment);Distraction(medicated prior to arrival)  Therapy/Group: Individual Therapy  Dezmen Alcock, Selinda Orion 06/20/2018, 3:57 PM

## 2018-06-20 NOTE — Progress Notes (Signed)
Speech Language Pathology Daily Session Note  Patient Details  Name: Misty Carroll MRN: [id number] Date of Birth: 1933/06/19  Today's Date: 06/20/2018 SLP Individual Time: 1040-1103 SLP Individual Time Calculation (min): 23 min  Short Term Goals: Week 1: SLP Short Term Goal 1 (Week 1): STGs=LTGs  Skilled Therapeutic Interventions:Skilled ST services focused on cognitive skills. Pt demonstrated agitating behaviors removing safety belt, expressing it was too tight. SLP removed safety belt when present beside pt in room. SLP facilitated orientation of place and situation given external aid , pt required total-max A verba cues with carryover of information x1 (place). Nursing provided pain medication. Pt demonstrated tangential speech and some language of confusion. SLP provided comfort adjust chair and apply safety belt piror to leave. Pt demonstrated a reduction in agitation upon leaving the room. Pt was left in room with call bell within reach and chair alarm set. SLP reccomends to continue skilled services.     Pain Pain Assessment Pain Score: 4  Pain Type: Acute pain Pain Location: Hip Pain Descriptors / Indicators: Aching Pain Intervention(s): Medication (See eMAR);RN made aware  Therapy/Group: Individual Therapy  Fatou Dunnigan  [facility] 06/20/2018, 2:33 PM

## 2018-06-20 NOTE — Progress Notes (Signed)
Occupational Therapy Note  Patient Details  Name: Misty Carroll MRN: [id number] Date of Birth: 08/09/1932  Today's Date: 06/20/2018 OT Missed Time: 30 Minutes Missed Time Reason: Patient on bedrest  Per RN, pt with significant medical event this morning. She is currently resting and not appropriate for therapy at this time. Will cont POC as pt medically appropriate.    Ladashia Demarinis L 06/20/2018, 2:28 PM

## 2018-06-21 ENCOUNTER — Inpatient Hospital Stay (HOSPITAL_COMMUNITY): Payer: Medicare Other | Admitting: Occupational Therapy

## 2018-06-21 ENCOUNTER — Inpatient Hospital Stay (HOSPITAL_COMMUNITY): Payer: Medicare Other

## 2018-06-21 ENCOUNTER — Inpatient Hospital Stay (HOSPITAL_COMMUNITY): Payer: Medicare Other | Admitting: Speech Pathology

## 2018-06-21 ENCOUNTER — Inpatient Hospital Stay (HOSPITAL_COMMUNITY): Payer: Medicare Other | Admitting: Physical Therapy

## 2018-06-21 DIAGNOSIS — G9340 Encephalopathy, unspecified: Secondary | ICD-10-CM

## 2018-06-21 LAB — CBC WITH DIFFERENTIAL/PLATELET
ABS IMMATURE GRANULOCYTES: 0.04 10*3/uL (ref 0.00–0.07)
Basophils Absolute: 0 10*3/uL (ref 0.0–0.1)
Basophils Relative: 0 %
Eosinophils Absolute: 0 10*3/uL (ref 0.0–0.5)
Eosinophils Relative: 0 %
HCT: 37.3 % (ref 36.0–46.0)
Hemoglobin: 12.2 g/dL (ref 12.0–15.0)
Immature Granulocytes: 1 %
Lymphocytes Relative: 14 %
Lymphs Abs: 1.2 10*3/uL (ref 0.7–4.0)
MCH: 31.7 pg (ref 26.0–34.0)
MCHC: 32.7 g/dL (ref 30.0–36.0)
MCV: 96.9 fL (ref 80.0–100.0)
Monocytes Absolute: 0.6 10*3/uL (ref 0.1–1.0)
Monocytes Relative: 7 %
Neutro Abs: 6.4 10*3/uL (ref 1.7–7.7)
Neutrophils Relative %: 78 %
Platelets: 185 10*3/uL (ref 150–400)
RBC: 3.85 MIL/uL — ABNORMAL LOW (ref 3.87–5.11)
RDW: 13.6 % (ref 11.5–15.5)
WBC: 8.2 10*3/uL (ref 4.0–10.5)
nRBC: 0 % (ref 0.0–0.2)

## 2018-06-21 LAB — BASIC METABOLIC PANEL
Anion gap: 13 (ref 5–15)
BUN: 14 mg/dL (ref 8–23)
CO2: 24 mmol/L (ref 22–32)
Calcium: 9.1 mg/dL (ref 8.9–10.3)
Chloride: 97 mmol/L — ABNORMAL LOW (ref 98–111)
Creatinine, Ser: 0.83 mg/dL (ref 0.44–1.00)
GFR calc Af Amer: >60 mL/min (ref >60)
GFR calc non Af Amer: >60 mL/min (ref >60)
Glucose, Bld: 118 mg/dL — ABNORMAL HIGH (ref 70–99)
Potassium: 3.9 mmol/L (ref 3.5–5.1)
Sodium: 134 mmol/L — ABNORMAL LOW (ref 135–145)

## 2018-06-21 MED ORDER — SODIUM CHLORIDE 0.45 % IV SOLN
INTRAVENOUS | Status: DC
  Administered 2018-06-21 – 2018-06-29 (×8): via INTRAVENOUS

## 2018-06-21 MED ORDER — TRAZODONE HCL 50 MG PO TABS
25.0000 mg | ORAL_TABLET | Freq: Every evening | ORAL | Status: DC | PRN
  Administered 2018-06-21 – 2018-07-06 (×12): 25 mg via ORAL
  Filled 2018-06-21 (×14): qty 1

## 2018-06-21 MED ORDER — LISINOPRIL 5 MG PO TABS
5.0000 mg | ORAL_TABLET | Freq: Every day | ORAL | Status: DC
  Administered 2018-06-21: 5 mg via ORAL
  Filled 2018-06-21: qty 1

## 2018-06-21 NOTE — Progress Notes (Signed)
EEG completed; results pending.    

## 2018-06-21 NOTE — Progress Notes (Signed)
Physical Therapy Session Note  Patient Details  Name: VIRDELL HOILAND MRN: [id number] Date of Birth: 1932-10-02  Today's Date: 06/21/2018 PT Individual Time: 0800-0900 PT Individual Time Calculation (min): 60 min   Short Term Goals: Week 1:  PT Short Term Goal 1 (Week 1): =LTGs due to ELOS  Skilled Therapeutic Interventions/Progress Updates: Pt received seated EOB with handoff from NT; denies pain and agreeable to treatment. Pt appears somewhat lethargic, decreased engagement with therapist at start of session. Therapist provided setupA for cutting food, opening containers to facilitate pt eating as pt with poor attention and poor appetite. Ambulation within room to go in/out of bathroom; pt indicating need to use restroom, then once in restroom pt attempted to go into shower, then reporting she did not need to void. Standing at sink pt performs oral hygiene with S initially; slow initiation of task despite cues, once therapist handed pt toothbrush/toothpaste pt able to apply. Toothpaste then fell off under stream of water; therapist performed second time. Gait 2x100' with RW and min guard to close S; slow speed and moderate cues for following commands for direction finding. Remained seated in recliner at end of session, lap belt alarm intact and all needs in reach.      Therapy Documentation Precautions:  Precautions Precautions: Fall Restrictions Weight Bearing Restrictions: Yes LLE Weight Bearing: Weight bearing as tolerated   Therapy/Group: Individual Therapy  Corliss Skains 06/21/2018, 11:32 AM

## 2018-06-21 NOTE — Progress Notes (Signed)
Social Work Patient ID: Misty Carroll, female   DOB: 1932/09/28, 84 y.o.   MRN: [id number] Met with daughter-Juliana to discuss team conference goals supervision level and the medical issues preventing team form setting target discharge date. She plans on staying with pt tonight to see if this will help her sleep since she is not sleeping. Pt had another episode again today around the dame time. MD ordering multiple tests-CT, EEG, EKG etc. Both daughter's have been here when she had an episode, it is very concerning to them. Will continue to follow and provide support.

## 2018-06-21 NOTE — Progress Notes (Signed)
Pt unavailable for EEG at this time; going for CT.

## 2018-06-21 NOTE — Progress Notes (Signed)
Occupational Therapy Session Note  Patient Details  Name: ADONIS RYTHER MRN: [id number] Date of Birth: 07/26/1932  Today's Date: 06/21/2018 OT Individual Time: 1115-1200 OT Individual Time Calculation (min): 45 min    Short Term Goals: Week 1:  OT Short Term Goal 1 (Week 1): n/a d/t ELOS  Skilled Therapeutic Interventions/Progress Updates:    1:1. Pt initially agitated with belt around recliner. Pt dresses at sit to stand at sink. Pt would benefit from trialling standard chair at end of bed to simulate set up at home. Pt requires mod A to don LB clothing and footwear with LLE and S for advancing pants past hips. Pt UB dressing with set up. Exited session with pt seated in standard chair with call light in reach, belt alarm on, daughter in room and all need smet.  Therapy Documentation Precautions:  Precautions Precautions: Fall Restrictions Weight Bearing Restrictions: Yes LLE Weight Bearing: Weight bearing as tolerated General:   Vital Signs:  Pain: Pain Assessment Pain Scale: Faces Faces Pain Scale: Hurts a little bit ADL: ADL Grooming: Contact guard Where Assessed-Grooming: Standing at sink Upper Body Bathing: Moderate cueing Where Assessed-Upper Body Bathing: Sitting at sink Lower Body Bathing: Moderate cueing, Contact guard Where Assessed-Lower Body Bathing: Sitting at sink, Standing at sink Upper Body Dressing: Minimal cueing Where Assessed-Upper Body Dressing: Sitting at sink Lower Body Dressing: Moderate assistance Where Assessed-Lower Body Dressing: Standing at sink Toileting: Minimal assistance Where Assessed-Toileting: Bedside Commode Toilet Transfer: Minimal assistance Toilet Transfer Method: Stand pivot Toilet Transfer Equipment: Bedside commode Vision   Perception    Praxis   Exercises:   Other Treatments:     Therapy/Group: Individual Therapy  Tonny Branch 06/21/2018, 12:09 PM

## 2018-06-21 NOTE — Progress Notes (Signed)
Patient called out saying something was wrong with her mother. I went down to the room and she was unresponsive. I performed sternal rub with no response. We got her back in bed and took vitals which showed low blood pressure. Marlowe Shores, PA ordered 0.45 saline at 68ml/hr. After 30 minutes patient began to come around and answer questions appropriately. She slept most part of the afternoon but was easy to arouse.

## 2018-06-21 NOTE — Patient Care Conference (Signed)
Inpatient RehabilitationTeam Conference and Plan of Care Update Date: 06/21/2018   Time: 2:30 PM    Patient Name: Misty Carroll      Medical Record Number: [id number]  Date of Birth: 16-Oct-1932 Sex: Female         Room/Bed: 4M08C/4M08C-01 Payor Info: Payor: MEDICARE / Plan: MEDICARE PART A AND B / Product Type: *No Product type* /    Admitting Diagnosis: TBI DEMENTIA  Admit Date/Time:  06/16/2018  6:18 PM Admission Comments: No comment available   Primary Diagnosis:  <principal problem not specified> Principal Problem: <principal problem not specified>  Patient Active Problem List   Diagnosis Date Noted  . Orthostasis   . Hyponatremia   . Sleep disturbance   . Closed nondisplaced intertrochanteric fracture of left femur ([place])   . Labile blood pressure   . Hypokalemia   . Acute blood loss anemia   . Hypoalbuminemia due to protein-calorie malnutrition ([place])   . Traumatic subdural hematoma ([place]) 06/16/2018  . Fall 06/13/2018  . Uterine prolapse 01/19/2017  . Cystocele, midline 01/19/2017  . Cystocele with small rectocele and uterine descent 09/03/2016  . Uterovaginal prolapse, incomplete 09/03/2016  . Left-sided epistaxis 01/25/2013    Expected Discharge Date:    Team Members Present: Physician leading conference: Dr. Delice Lesch Social Worker Present: Ovidio Kin, LCSW Nurse Present: Rayetta Pigg, RN PT Present: Kennyth Lose, PT OT Present: Amy Rounds, OT SLP Present: Windell Moulding, SLP PPS Coordinator present : Gunnar Fusi;Daiva Nakayama, RN, CRRN     Current Status/Progress Goal Weekly Team Focus  Medical   Decreased functional mobility secondary to traumatic SAH/TBI and left femoral neck fracture. Status post ORIF 12 05/02/2017  Improve mobility, cognition, sleep, electrolytes, BP  See above   Bowel/Bladder   Pt is continent B/B. LBM 12/15.  Remain continent B/B.  Assist with toileting needs PRN.   Swallow/Nutrition/ Hydration             ADL's   CGA  overall max A cuing for sequencing and safety. Limited participation based on medical needs  Supervision overall  Cognitive remediation, functional standing balance/endurance, ADL re-training, d/c planning   Mobility   S bed mobility, min guard transfers, gait with RW, stairs  S overall  activity tolerance, LE strengthening, dynamic standing balance, cognitive remidiation   Communication   mod assist for word finding  min assist   expression of needs and wants to caregivers    Safety/Cognition/ Behavioral Observations  max assist   min-mod assist   basic problem solving, orientation, memory   Pain   Pt complains of pain of 6 to L knee on 06/20/2018. Pain managed with one-time dose of Tramadol.   Pain < 3  Assess pain Q shift and PRN.    Skin   Pt has surgical dressing to L hip; ecchymosis to lateral L arm from wrist to shoulder and L side of face; sutures to L orbital near brow.  Treat skin issues according to orders. Maintain skin integrity and prevent skin breakdown.   Assess skin Q shift and PRN.       *See Care Plan and progress notes for long and short-term goals.     Barriers to Discharge  Current Status/Progress Possible Resolutions Date Resolved   Physician    Medical stability;Other (comments);Decreased caregiver support;Lack of/limited family support  Unresponsive episodes  See above  Therapies, optimize sleep, follow labs, optimize BP      Nursing  PT                    OT                  SLP                SW                Discharge Planning/Teaching Needs:  One daughter says home with 24 hr care per daughter's another daughter states: " Unsure of the plan. Would do better in her own environment due to her dementia      Team Discussion:  Pt goals are supervision level but each day has had a epsiode of unresponsiveness, MD ordering tests and will see what is going one. Daughter's aware and one plans to sty here tonight to see if will help with pt  sleeping. DC BP meds started yesterday. Memory issues will need 24 hr care at discharge.   Revisions to Treatment Plan:  Medical issues unable to set target discharge date    Continued Need for Acute Rehabilitation Level of Care: The patient requires daily medical management by a physician with specialized training in physical medicine and rehabilitation for the following conditions: Daily direction of a multidisciplinary physical rehabilitation program to ensure safe treatment while eliciting the highest outcome that is of practical value to the patient.: Yes Daily medical management of patient stability for increased activity during participation in an intensive rehabilitation regime.: Yes Daily analysis of laboratory values and/or radiology reports with any subsequent need for medication adjustment of medical intervention for : Neurological problems;Blood pressure problems;Other   I attest that I was present, lead the team conference, and concur with the assessment and plan of the team.   Elease Hashimoto 06/22/2018, 3:29 PM

## 2018-06-21 NOTE — Progress Notes (Signed)
[place] PHYSICAL MEDICINE & REHABILITATION PROGRESS NOTE   Subjective/Complaints: Patient seen laying in bed this morning.  She is confused and disoriented.  Discussed with nursing stating that patient did not sleep well overnight due to having slept in the day yesterday.  ROS: Limited due to cognition.  Objective:   No results found. Recent Labs    06/19/18 0511  WBC 5.5  HGB 11.7*  HCT 37.0  PLT 172   Recent Labs    06/19/18 0511  NA 140  K 3.3*  CL 100  CO2 29  GLUCOSE 109*  BUN 15  CREATININE 0.55  CALCIUM 8.8*    Intake/Output Summary (Last 24 hours) at 06/21/2018 0853 Last data filed at 06/21/2018 0400 Gross per 24 hour  Intake 1405.3 ml  Output -  Net 1405.3 ml     Physical Exam: Vital Signs Blood pressure (!) 167/64, pulse (!) 41, temperature 97.6 F (36.4 C), temperature source Oral, resp. rate 14, height 5\' 5"  (1.651 m), weight 54.5 kg, SpO2 98 %. Constitutional: No distress . Vital signs reviewed. HENT: Normocephalic.  Atraumatic. Eyes: EOMI. No discharge. Cardiovascular: Irregularly irregular.  No JVD. Respiratory: CTA bilaterally.  Normal effort. GI: BS +. Non-distended. Musc: No edema or tenderness in extremities. Neurological: Alert and oriented x0 HOH Delays in processing.   Motor: Bilateral UE 4+/5 proximal to distal RLE: 4/5 proximal to distal LLE: 4-/5 prox to distal.  No focal sensory deficits Skin: Skin iswarm. Wound CDIleft hip Psychiatric: Confused, tangential, and disoriented  Assessment/Plan: 1. Functional deficits secondary to TBI, left fnf  which require 3+ hours per day of interdisciplinary therapy in a comprehensive inpatient rehab setting.  Physiatrist is providing close team supervision and 24 hour management of active medical problems listed below.  Physiatrist and rehab team continue to assess barriers to discharge/monitor patient progress toward functional and medical goals  Care Tool:  Bathing    Body  parts bathed by patient: Right arm, Left arm, Chest, Abdomen, Front perineal area, Buttocks, Right upper leg, Left upper leg, Right lower leg, Left lower leg, Face         Bathing assist Assist Level: Minimal Assistance - Patient > 75%     Upper Body Dressing/Undressing Upper body dressing   What is the patient wearing?: Pull over shirt    Upper body assist Assist Level: Supervision/Verbal cueing    Lower Body Dressing/Undressing Lower body dressing      What is the patient wearing?: Underwear/pull up, Pants     Lower body assist Assist for lower body dressing: Moderate Assistance - Patient 50 - 74%     Toileting Toileting Toileting Activity did not occur Landscape architect and hygiene only): Safety/medical concerns  Toileting assist Assist for toileting: Independent with assistive device Assistive Device Comment: walker   Transfers Chair/bed transfer  Transfers assist  Chair/bed transfer activity did not occur: Safety/medical concerns  Chair/bed transfer assist level: Contact Guard/Touching assist     Locomotion Ambulation   Ambulation assist      Assist level: Contact Guard/Touching assist Assistive device: Walker-rolling Max distance: 161ft   Walk 10 feet activity   Assist     Assist level: Contact Guard/Touching assist Assistive device: Walker-rolling   Walk 50 feet activity   Assist    Assist level: Contact Guard/Touching assist Assistive device: Walker-rolling    Walk 150 feet activity   Assist Walk 150 feet activity did not occur: Safety/medical concerns  Assist level: Contact Guard/Touching assist Assistive device: Walker-rolling  Walk 10 feet on uneven surface  activity   Assist Walk 10 feet on uneven surfaces activity did not occur: Safety/medical concerns         Wheelchair     Assist Will patient use wheelchair at discharge?: No   Wheelchair activity did not occur: N/A         Wheelchair 50 feet with  2 turns activity    Assist    Wheelchair 50 feet with 2 turns activity did not occur: N/A       Wheelchair 150 feet activity     Assist Wheelchair 150 feet activity did not occur: N/A         Medical Problem List and Plan: 1.Decreased functional mobilitysecondary to traumatic SAH/TBI and left femoral neck fracture. Status post ORIF 12 05/02/2017.  Continue CIR  Follow-up cranial CT scan 12/14 discussed with neurosurgery, continue to follow with repeat scan at the end of the week or sooner if concern for decline.  Patient with?  Increasing effusion, however per nursing patient did not sleep well overnight.  We will continue to monitor and consider CT if confusion persists/gets worse.  Weightbearing as tolerated 2. DVT Prophylaxis/Anticoagulation: SCDs. Dopplers limited, but no DVT noted 3. Pain Management:Robaxin as needed 4. Mood:Aricept 5 mg daily, Risperdal 1 mg daily 5. Neuropsych: This patientis ?fully capable of making decisions on herown behalf. 6. Skin/Wound Care:Routine skin checks 7. Fluids/Electrolytes/Nutrition:Routine in and out's 8. Seizure prophylaxis. Keppra 500 mg twice a day 9. Hypothyroidism. Synthroid 10. Constipation. Laxative assistance 11.  Labile blood pressure  Labile with hypertensive crisis  Lisinopril 5 started on 12/18  Continue to monitor 12.  Hypokalemia  Potassium 3.3 on 12/16  Labs ordered for tomorrow  Supplemented x2 days  Continue to monitor 13.  Acute blood loss anemia  Hemoglobin 11.7 on 12/16  Continue to monitor 14.  Hypoalbuminemia  Supplement initiated on 12/16 15.  Sleep disturbance  EKG on 12/11 reviewed, trazodone started on 12/17, however not given, changed to as needed  May try tramadol at night as patient used to get good relief of pain and sleep with this medication   LOS: 5 days A FACE TO FACE EVALUATION WAS PERFORMED  Ankit Lorie Phenix 06/21/2018, 8:53 AM

## 2018-06-21 NOTE — Progress Notes (Signed)
Speech Language Pathology Daily Session Note  Patient Details  Name: Misty Carroll MRN: [id number] Date of Birth: 02-14-1933  Today's Date: 06/21/2018 SLP Individual Time: 1009-1105 SLP Individual Time Calculation (min): 56 min  Short Term Goals: Week 1: SLP Short Term Goal 1 (Week 1): STGs=LTGs  Skilled Therapeutic Interventions:  Pt was seen for skilled ST targeting cognitive goals.  Upon arrival, pt was resting in her recliner with daughter present at bedside.  Daughter reports poor sleep at night time and pt was slow to arouse earlier this morning.  Daughter suggested that therapies begin later in the morning and therapist communicated request to scheduling team.  Pt was easily awakened to voice and light touch and was agreeable to participating in therapies.  Pt requested to go the bathroom before leaving the room when asked and was continent of urine.  SLP facilitated the session with a basic, familiar card game to address problem solving and attention goals.  Pt was able to sustain her attention to task for 5 minute intervals before requiring min cues for redirection.  Pt could problem solve within task with min-mod cues.  Pt was returned to room and left in recliner with daughter at bedside.  SLP reviewed calendar and orientation information with pt prior to departure.  Continue per current plan of care.    Pain Pain Assessment Pain Scale: 0-10 Pain Score: 0-No pain Faces Pain Scale: Hurts a little bit  Therapy/Group: Individual Therapy  Misty Carroll, Selinda Orion 06/21/2018, 12:41 PM

## 2018-06-21 NOTE — Procedures (Signed)
History: 84 year old female being evaluated for encephalopathy  Sedation: None  Technique: This is a 21 channel routine scalp EEG performed at the bedside with bipolar and monopolar montages arranged in accordance to the international 10/20 system of electrode placement. One channel was dedicated to EKG recording.    Background: The background is slightly asymmetric, with a posterior dominant rhythm of 8 Hz which is attenuated on the left compared to the right.  In addition, there is generalized irregular delta and theta activity, which is much more prominent over the right occipital region.  I suspect the suboccipital skull defect(best seen on images 6 and 7 of the CT performed today) could be responsible for the focal prominance.  Photic stimulation: Physiologic driving is not performed  EEG Abnormalities: 1) Asymetric background with attenuation of faster frequencies on the left.  2) generalized irregular slow activity.   Clinical Interpretation: This EEG recorded evidence of a left hemispheric focal cerebral dysfunction in the setting of a mild generalized nonspecific cerebral dysfunction (encephalopathy).  The prominence of slow activity over the right occipital region I suspect is artifactual related to a skull defect, but a nonspecific focal right occipital dysfunction is not excluded.    There was no seizure or seizure predisposition recorded on this study. Please note that lack of epileptiform activity on EEG does not preclude the possibility of epilepsy.   Roland Rack, MD [facility] [phone]  If 7pm- 7am, please page neurology on call as listed in [place].

## 2018-06-21 NOTE — Progress Notes (Signed)
Called to room via NT.  Patient unresponsive sitting in chair next to bed.  Initially BP 80s/50s while sitting in chair.  Assisted patient back to bed.  Patient responds to hard sternal rub but does not open eyes.  Noted to be drooling some out of the right side of her mouth.  Marlowe Shores, PA called and present at bedside to assess patient.  New orders received for EKG, imaging, and labs.  Patients BP rebounded to 130s/70s.  O2 sats stable 97% throughout.  EKG reviewed by Marlowe Shores, PA.  IVF started per PA order.  Patient starting to respond to verbal commands but is mumbling incoherently.  Patients daughter at bedside, visibly upset.  Patients primary RN notified of event and will continue to monitor patient closely.  Brita Romp, RN

## 2018-06-21 NOTE — Progress Notes (Signed)
Occupational Therapy Note  Patient Details  Name: Misty Carroll MRN: [id number] Date of Birth: 1933/06/13  Today's Date: 06/21/2018 OT Missed Time: 74 Minutes Missed Time Reason: Patient on bedrest  Pt's RN reports pt currently receiving medical tests. Requests pt remain on bed rest until medical work up is complete. Pt missed 30 min OT session as result. Will cont POC as pt appropriate.   Tamicka Shimon L 06/21/2018, 2:49 PM

## 2018-06-22 ENCOUNTER — Inpatient Hospital Stay (HOSPITAL_COMMUNITY): Payer: Medicare Other | Admitting: Occupational Therapy

## 2018-06-22 ENCOUNTER — Inpatient Hospital Stay (HOSPITAL_COMMUNITY): Payer: Medicare Other | Admitting: Speech Pathology

## 2018-06-22 ENCOUNTER — Inpatient Hospital Stay (HOSPITAL_COMMUNITY): Payer: Medicare Other | Admitting: Physical Therapy

## 2018-06-22 DIAGNOSIS — E871 Hypo-osmolality and hyponatremia: Secondary | ICD-10-CM

## 2018-06-22 DIAGNOSIS — I951 Orthostatic hypotension: Secondary | ICD-10-CM

## 2018-06-22 DIAGNOSIS — S72009A Fracture of unspecified part of neck of unspecified femur, initial encounter for closed fracture: Secondary | ICD-10-CM

## 2018-06-22 HISTORY — DX: Fracture of unspecified part of neck of unspecified femur, initial encounter for closed fracture: S72.009A

## 2018-06-22 NOTE — Progress Notes (Signed)
PHYSICAL MEDICINE & REHABILITATION PROGRESS NOTE   Subjective/Complaints: Patient seen lying in bed this morning.  Daughter at bedside.  Patient slept relatively well per daughter.  She still sleepy this morning.  Daughter states she just received her medications.  Discussed with nursing regarding suture removal to eyebrow.  Patient with episode of nonresponsiveness yesterday.  ROS: Limited due to cognition.  Objective:   Ct Head Wo Contrast  Result Date: 06/21/2018 CLINICAL DATA:  Followup intracranial hemorrhage. EXAM: CT HEAD WITHOUT CONTRAST TECHNIQUE: Contiguous axial images were obtained from the base of the skull through the vertex without intravenous contrast. COMPARISON:  06/17/2018 FINDINGS: Brain: The brainstem and cerebellum remain normal. Within the left temporal lobe, there remains visible intraparenchymal hematoma measured at 27 x 25 x 23 mm. When measured in the same locations, this is the same. Surrounding edema remains evident. No significant mass effect or shift. No new bleeding. Small low-density subdural hematoma/hygroma on the right as seen previously, maximal thickness 5 mm. Subdural fluid on the left is diminished. Chronic small-vessel ischemic changes of the hemispheric white matter. No new insult. No hydrocephalus. Vascular: There is atherosclerotic calcification of the major vessels at the base of the brain. Skull: Negative Sinuses/Orbits: Clear/normal Other: None IMPRESSION: No significant change in the left temporal intraparenchymal hematoma with surrounding edema. No significant mass effect or shift. No new bleeding. Low-density right subdural hematoma/hygroma, maximal thickness 5 mm. Electronically Signed   By: Nelson Chimes M.D.   On: 06/21/2018 14:15   Recent Labs    06/21/18 1245  WBC 8.2  HGB 12.2  HCT 37.3  PLT 185   Recent Labs    06/21/18 1245  NA 134*  K 3.9  CL 97*  CO2 24  GLUCOSE 118*  BUN 14  CREATININE 0.83  CALCIUM 9.1     Intake/Output Summary (Last 24 hours) at 06/22/2018 0824 Last data filed at 06/22/2018 0200 Gross per 24 hour  Intake 905 ml  Output -  Net 905 ml     Physical Exam: Vital Signs Blood pressure (!) 142/70, pulse 72, temperature 98 F (36.7 C), temperature source Oral, resp. rate 16, height 5\' 5"  (1.651 m), weight 54.5 kg, SpO2 97 %. Constitutional: No distress . Vital signs reviewed. HENT: Normocephalic.  Atraumatic. Eyes: EOMI. No discharge. Cardiovascular: Irregularly irregular.  No JVD. Respiratory: CTA bilaterally.  Normal effort. GI: BS +. Non-distended. Musc: No edema or tenderness in extremities. Neurological: Alert and oriented x0 HOH Delays in processing.   Motor: Bilateral UE 4+/5 proximal to distal, unable to participate RLE: 4/5 proximal to distal, unable to participate LLE: 4-/5 prox to distal, when able to participate Skin: Skin iswarm. Left eyebrow with sutures C/D/I Left hip with staples C/D/I Psychiatric: Confused, tangential, and disoriented  Assessment/Plan: 1. Functional deficits secondary to TBI, left fnf  which require 3+ hours per day of interdisciplinary therapy in a comprehensive inpatient rehab setting.  Physiatrist is providing close team supervision and 24 hour management of active medical problems listed below.  Physiatrist and rehab team continue to assess barriers to discharge/monitor patient progress toward functional and medical goals  Care Tool:  Bathing    Body parts bathed by patient: Right arm, Left arm, Chest, Abdomen, Front perineal area, Buttocks, Right upper leg, Left upper leg, Right lower leg, Left lower leg, Face         Bathing assist Assist Level: Minimal Assistance - Patient > 75%     Upper Body Dressing/Undressing Upper body dressing  What is the patient wearing?: Pull over shirt    Upper body assist Assist Level: Supervision/Verbal cueing    Lower Body Dressing/Undressing Lower body dressing      What  is the patient wearing?: Underwear/pull up, Pants     Lower body assist Assist for lower body dressing: Moderate Assistance - Patient 50 - 74%     Toileting Toileting Toileting Activity did not occur Landscape architect and hygiene only): Safety/medical concerns  Toileting assist Assist for toileting: Independent with assistive device Assistive Device Comment: walker   Transfers Chair/bed transfer  Transfers assist  Chair/bed transfer activity did not occur: Safety/medical concerns  Chair/bed transfer assist level: Contact Guard/Touching assist     Locomotion Ambulation   Ambulation assist      Assist level: Contact Guard/Touching assist Assistive device: Walker-rolling Max distance: 100   Walk 10 feet activity   Assist     Assist level: Contact Guard/Touching assist Assistive device: Walker-rolling   Walk 50 feet activity   Assist    Assist level: Contact Guard/Touching assist Assistive device: Walker-rolling    Walk 150 feet activity   Assist Walk 150 feet activity did not occur: Safety/medical concerns  Assist level: Contact Guard/Touching assist Assistive device: Walker-rolling    Walk 10 feet on uneven surface  activity   Assist Walk 10 feet on uneven surfaces activity did not occur: Safety/medical concerns         Wheelchair     Assist Will patient use wheelchair at discharge?: No   Wheelchair activity did not occur: N/A         Wheelchair 50 feet with 2 turns activity    Assist    Wheelchair 50 feet with 2 turns activity did not occur: N/A       Wheelchair 150 feet activity     Assist Wheelchair 150 feet activity did not occur: N/A         Medical Problem List and Plan: 1.Decreased functional mobilitysecondary to traumatic SAH/TBI and left femoral neck fracture. Status post ORIF 12 05/02/2017.  Continue CIR  Follow-up cranial CT scan 12/18 reviewed, stable.   Weightbearing as tolerated 2. DVT  Prophylaxis/Anticoagulation: SCDs. Dopplers limited, but no DVT noted 3. Pain Management:Robaxin as needed 4. Mood:Aricept 5 mg daily, Risperdal 1 mg daily 5. Neuropsych: This patientis ?fully capable of making decisions on herown behalf. 6. Skin/Wound Care:Routine skin checks 7. Fluids/Electrolytes/Nutrition:Routine in and out's  Variable nutritional intake 8. Seizure prophylaxis. Keppra 500 mg twice a day 9. Hypothyroidism. Synthroid 10. Constipation. Laxative assistance 11.  Labile blood pressure  Labile with hypertensive crisis  Lisinopril 5 started on 12/18  Continue to monitor 12.  Hypokalemia: Resolved  Potassium 3.9 on 12/18  Continue to monitor 13.  Acute blood loss anemia: Resolved  Hemoglobin 12.2 on 12/18  Continue to monitor 14.  Hypoalbuminemia  Supplement initiated on 12/16 15.  Sleep disturbance  EKG on 12/11 reviewed, trazodone started on 12/17, however not given, changed to as needed  May try tramadol at night as patient used to get good relief of pain and sleep with this medication 16.  Hyponatremia  Sodium 134 on 12/18  Continue to monitor 17.  Nonresponsiveness  CT stable, EEG negative, labs relatively unremarkable  Suspect related to hydration status and orthostasis  Teds/abdominal binder as needed   LOS: 6 days A FACE TO FACE EVALUATION WAS PERFORMED  Treyvin Glidden Lorie Phenix 06/22/2018, 8:24 AM

## 2018-06-22 NOTE — Progress Notes (Signed)
Physical Therapy Session Note  Patient Details  Name: Misty Carroll MRN: [id number] Date of Birth: 1933/03/29  Today's Date: 06/22/2018 PT Individual Time: 0800-0900 PT Individual Time Calculation (min): 60 min   Short Term Goals: Week 1:  PT Short Term Goal 1 (Week 1): =LTGs due to ELOS  Skilled Therapeutic Interventions/Progress Updates: Pt received seated in recliner with daughter present eating breakfast; per daughter pt only willing to eat one peanut butter cracker. TEDs donned totalA. Gait to gym with RW and close S x150'; antalgic gait pattern but no c/o L hip pain. Pt ambulates up/down ramp and over mulch surface with RW and close S; min cues for RW navigation on mulch surface. Pt resistant to perform car transfer until daughter sat in drivers seat; then performed with min guard, increased time for elevating LLE into car despite cues to turn and sit on seat, then increased time to scoot in/out of seat. Performed TUG with RW and S total 67 sec; educated daughter on results and purpose of test. Pt ambulated to ADL apartment with RW and S. Able to identify door locked and gestures need for key to open it, unable to verbalize the word "key". Unlocks door with minA and increased time for trial/error of fitting key. Recliner transfer performed with min guard; rest break while seated in recliner to take medication from RN. Attempted to engage pt in familiar task of washing dishes, pt declines stating "I can't" and unwilling to attempt even with daughter providing encouragement. Gait in hospital atrium with RW and min guard/close S; performed transfer to booth seat as pt's daughter reports she enjoys going out to eat; requires min guard and multiple attempts to stand from booth seat without arm rests. Returned to recliner min guard stand pivot; remained in recliner with lap belt alarm intact and daughter present, all needs in reach.      Therapy Documentation Precautions:  Precautions Precautions:  Fall Restrictions Weight Bearing Restrictions: Yes LLE Weight Bearing: Weight bearing as tolerated    Therapy/Group: Individual Therapy  Corliss Skains 06/22/2018, 9:41 AM

## 2018-06-22 NOTE — Progress Notes (Signed)
Speech Language Pathology Daily Make-Up Session Note  Patient Details  Name: Misty Carroll MRN: [id number] Date of Birth: [birth year]  Today's Date: 06/22/2018 SLP Individual Time: 8257-4935 SLP Individual Time Calculation (min): 30 min  Short Term Goals: Week 1: SLP Short Term Goal 1 (Week 1): STGs=LTGs  Skilled Therapeutic Interventions: Skilled treatment session focused on cognitive-linguistic goals. SLP facilitated session by initially providing total A for naming of functional items. However, patient able to read at the word level and match the written word to a functional item from a field of 2 with 85% accuracy. After a 5 minute delay, patient able to name same functional items with 70% accuracy without cues. Of note, this clinician administered patient's evaluation and patient's overall cognitive-linguistic function appears to have declined drastically since initial evaluation. Patient left upright in recliner with alarm on and daughter present. Continue with current plan of care.      Pain Pain Assessment Pain Scale: 0-10 Pain Score: 0-No pain  Therapy/Group: Individual Therapy  Shanvi Moyd 06/22/2018, 4:07 PM

## 2018-06-22 NOTE — Progress Notes (Signed)
Sutures to left lateral eye area d/c'd at 1610. Bruising persists. Left hip staples remain intact. Bruising to left hip. Patient tolerated d/c of eye sutures.  Mliss Sax

## 2018-06-22 NOTE — Progress Notes (Signed)
Occupational Therapy Session Note  Patient Details  Name: Misty Carroll MRN: [id number] Date of Birth: 1933-04-14  Today's Date: 06/22/2018 OT Individual Time: 1400-1505 OT Individual Time Calculation (min): 65 min    Short Term Goals: Week 1:  OT Short Term Goal 1 (Week 1): n/a d/t ELOS  Skilled Therapeutic Interventions/Progress Updates:    Pt seen for OT ADL bathing/dressing session. Pt asleep in supine upon arrival with daughter present. Pt easily awoken and although pleasantly confused, agreeable to tx session. She ambulated throughout room with CGA using RW, max cuing for path finding within room/bathroom and cuing for safety awareness. She doffed clothing seated on toilet with min A. Transitioned into shower and bathed seated on padded tub bench. Required steadying assist and VCs for safety and sequencing during task, standing at grab bar to complete pericare/buttock hygiene.  She dressed seated in w/c, able to don shirt and bra with slightly increased time to orient clothing. Pants donned with steadying assist when standing to pull pants up. Required mod A to don shoes 2/2 limited ROM in B LEs.  Grooming tasks completed from w/c level at sink with min A. Pt left seated in w/c at end of session, chair belt alarm donned and all needs in reach.   Therapy Documentation Precautions:  Precautions Precautions: Fall Restrictions Weight Bearing Restrictions: Yes LLE Weight Bearing: Weight bearing as tolerated Pain:   No/denies pain   Therapy/Group: Individual Therapy  [name] 06/22/2018, 7:11 AM

## 2018-06-22 NOTE — Progress Notes (Signed)
Speech Language Pathology Daily Session Note  Patient Details  Name: Misty Carroll MRN: [id number] Date of Birth: 09/07/1932  Today's Date: 06/22/2018 SLP Individual Time: 0802-2336 SLP Individual Time Calculation (min): 41 min (Missed 19 minutes of skilled ST)  Short Term Goals: Week 1: SLP Short Term Goal 1 (Week 1): STGs=LTGs  Skilled Therapeutic Interventions:  Skilled treatment session focused on cognition goals. SLP facilitated session by providing Max A verbal cues for engagement with pt able to place numbers in calendar with good attention to task. Pt with little mental flexibility and unable to switch tasks when dietary staff entered room. After task completion, pt with no flexibility to perform another task. As a result, pt missed 19 minutes of skilled ST. Pt handed off to nurse.      Pain Pain Assessment Pain Scale: 0-10 Pain Score: 0-No pain  Therapy/Group: Individual Therapy  Paislie Tessler 06/22/2018, 3:21 PM

## 2018-06-22 NOTE — Progress Notes (Signed)
Social Work Patient ID: Misty Carroll, female   DOB: Dec 07, 1932, 84 y.o.   MRN: [id number] Met with pt and daughter who was rolling her around the unit. She reports she has had a much better day today and not had any episodes. Made aware she will require 24 hr supervision-cueing at discharge. She is discussing this with her sister. Will work on needs.

## 2018-06-23 ENCOUNTER — Inpatient Hospital Stay (HOSPITAL_COMMUNITY): Payer: Medicare Other | Admitting: Speech Pathology

## 2018-06-23 ENCOUNTER — Inpatient Hospital Stay (HOSPITAL_COMMUNITY): Payer: Medicare Other | Admitting: Physical Therapy

## 2018-06-23 ENCOUNTER — Inpatient Hospital Stay (HOSPITAL_COMMUNITY): Payer: Medicare Other | Admitting: Occupational Therapy

## 2018-06-23 NOTE — Progress Notes (Signed)
Physical Therapy Weekly Progress Note  Patient Details  Name: Misty Carroll MRN: [id number] Date of Birth: [birth year]  Beginning of progress report period: June 16, 2018 End of progress report period: June 23, 2018  Today's Date: 06/23/2018 PT Individual Time: 0900-1000 PT Individual Time Calculation (min): 60 min   Short term goals not set d/t estimated LOS. Pt currently requires min guard to S for all mobility with RW. Pt requires moderate cueing for mobility secondary to cognitive deficits; anticipate patient will continue to require close S for safety at d/c. Have begun family education with daughters who are working on scheduling 24/7 S at d/c.   Patient continues to demonstrate the following deficits muscle weakness, decreased cardiorespiratoy endurance, decreased initiation, decreased attention, decreased awareness, decreased problem solving, decreased safety awareness, decreased memory and delayed processing and decreased standing balance, decreased postural control and decreased balance strategies and therefore will continue to benefit from skilled PT intervention to increase functional independence with mobility.  Patient progressing toward long term goals..  Continue plan of care.  PT Short Term Goals Week 1:  PT Short Term Goal 1 (Week 1): =LTGs due to ELOS  Week 2:  PT Short Term Goal 1 (Week 1): =LTGs due to ELOS  Skilled Therapeutic Interventions/Progress Updates: Tx 1: Pt received seated in recliner with daughter present; denies pain and agreeable to treatment. Pt changed clothes with minA for threading LEs, standbyA for balance while changing shirt and pulling up/down pants. Gait in/out of bathroom and to/from sink with daughter providing min guard/close S; checked off daughter to perform transfers. Gait throughout unit with RW and S, slow speed. Performed ascent/descent 12 steps with min guard, reciprocal pattern, slow speed. Gait over obstacle course with RW and min  guard, moderate cueing for sequencing and daughter eventually walked in front of patient to demonstrate which was beneficial. W/c propulsion x50' with BUE; minA to initiate faded to S. Gait to return to room with daughter providing min guard. Remained in recliner at end of session, all needs in reach.   Tx 2: Pt asleep in bed; per daughter she had just fallen asleep and daughter requests therapist allow pt to sleep as she has been very fatigued and not sleeping well at night. Pt remained in bed, alarm intact and all needs in reach.      Therapy Documentation Precautions:  Precautions Precautions: Fall Restrictions Weight Bearing Restrictions: Yes LLE Weight Bearing: Weight bearing as tolerated Pain: Pain Assessment Pain Scale: 0-10 Pain Score: 0-No pain  General: Missed time 45 min   Therapy/Group: Individual Therapy  Corliss Skains 06/23/2018, 9:50 AM

## 2018-06-23 NOTE — Progress Notes (Signed)
Occupational Therapy Weekly Progress Note  Patient Details  Name: Misty Carroll MRN: [id number] Date of Birth: 04-23-1933  Beginning of progress report period: June 17, 2018 End of progress report period: June 23, 2018  Today's Date: 06/23/2018 OT Individual Time: 1400-1500 OT Individual Time Calculation (min): 60 min    Short term goals were not set this reporting period as pt expected to d/c in 7-10 days. Pt has had medical set-back with limited participationin some therapies due to medical events. Pt currently requires min A- CGA overall with mod cuing for sequencing, max cuing for pathfinding withing room and max cuing for safety awareness. Patient is continuing to work towards goals of supervision overall for basic ADLs. Pt's daughter's have been present intermitently during rehab admission and are aware of pt's current physical and cognitive level of function. Family still deciding on next step of care for pt as unsure they will be able to provide the strict 24 hr close supervision care the pt will require at d/c.   Patient continues to demonstrate the following deficits:acute pain, cognitive deficits, muscle weakness (generalized) and pain in joint and therefore will continue to benefit from skilled OT intervention to enhance overall performance with BADL and Reduce care partner burden.  Patient progressing toward long term goals..  Continue plan of care.  OT Short Term Goals Week 1:  OT Short Term Goal 1 (Week 1): n/a d/t ELOS OT Short Term Goal 1 - Progress (Week 1): Progressing toward goal Week 2:  OT Short Term Goal 1 (Week 2): Cont to work towards LTG of supervision overall  Skilled Therapeutic Interventions/Progress Updates:    Pt seen for OT session focusing on functional mobility, caregiver training, functional standing balance and transfers. Pt received sitting on toilet with hand off from RN. Pt's daughter present and provided assist throughout session as part of  caregiver education.  Pt's daughter provided close supervision-steadying assist and max cuing from daughter and therapist for sequencing of toileting task, unable to be cued to wipe buttock following BM, only completing peri-care. Total A for buttock hygiene. She ambulated with RW out of bathroom to sink and completed hand hygiene and oral care standing at sink with cuing for scanning to find needed items.  She ambulated throughout unit with RW and close supervision, max cuing for path finding to therapy gym. Following seated rest break, she stood to fold towels with supervision for standing balance during dynamic task without UE support. Education provided to pt's daughter regarding importance of pt's participation in ADLs and meaningful activities at d/c. Pt then ambulated with HHA to ADL apartment. Completed simulated tub/shower transfer utilizing tub bench with steadying assist following demonstration from therapist and max cuing. Pt responding better to environmental cuing.  Pt returned to room via HHA, max cuing to use environmental aids to assist with path finding to find room. Pt left seated in recliner at end of session, all needs in reach, chair belt alarm donned.   Therapy Documentation Precautions:  Precautions Precautions: Fall Restrictions Weight Bearing Restrictions: Yes LLE Weight Bearing: Weight bearing as tolerated Pain:   No/denies pain   Therapy/Group: Individual Therapy  Maye Parkinson L 06/23/2018, 6:52 AM

## 2018-06-23 NOTE — Progress Notes (Addendum)
Speech Language Pathology Weekly Progress and Session Note  Patient Details  Name: Misty Carroll MRN: [id number] Date of Birth: [birth year]  Beginning of progress report period: 06/17/2018 End of progress report period: 06/23/2018   Today's Date: 06/23/2018 SLP Individual Time: 0630-1601 SLP Individual Time Calculation (min): 41 min  Short Term Goals: Week 1: SLP Short Term Goal 1 (Week 1): STGs=LTGs    New Short Term Goals: Week 2: SLP Short Term Goal 1 (Week 2): Length of stay now longer than originally anticipated due to medical issues with decline in function, continue working towards LTG  Weekly Progress Updates:   Pt has made limited gains this reporting period and long term goals have been adjusted due to decline in function since initial evaluation and medical issues.  Pt is currently max to total assist for tasks due to significant cognitive-linguistic deficits.  Pt and family education is ongoing.  Pt would continue to benefit from skilled ST while inpatient in order to maximize functional independence and reduce burden of care prior to discharge.  Anticipate that pt will need 24/7 supervision at discharge in addition to [facility] follow up at next level of care.      Intensity: Minumum of 1-2 x/day, 30 to 90 minutes Frequency: 3 to 5 out of 7 days Duration/Length of Stay: 7-10 days  Treatment/Interventions: Cognitive remediation/compensation;Cueing hierarchy;Functional tasks;Environmental controls;Internal/external aids;Speech/Language facilitation;Patient/family education;Therapeutic Activities   Daily Session  Skilled Therapeutic Interventions: Pt was seen for skilled ST targeting cognitive goals.  Pt was received in recliner, resting, but awakened easily to voice and light touch.  Pt was agreeable to participate in therapy with encouragement.  SLP facilitated the session with a basic pattern replication with push pins on cork board.  Pt needed min assist to attend to task for  ~5 minute intervals.  Pt still has language of confusion, decreased verbal output, and decreased speech intelligibility.  Pt was returned to room and left in recliner with chair alarm set and call bell within reach.  Goals updated on this date to reflect current progress and plan of care.       General    Pain Pain Assessment Pain Scale: 0-10 Pain Score: 0-No pain  Therapy/Group: Individual Therapy  Misty Carroll, Selinda Orion 06/23/2018, 12:32 PM

## 2018-06-23 NOTE — Progress Notes (Signed)
[place] PHYSICAL MEDICINE & REHABILITATION PROGRESS NOTE   Subjective/Complaints: Patient seen sitting up in her chair this morning.  Daughter at bedside.  Daughter states patient slept well overnight.  ROS: Limited due to cognition.  Objective:   Ct Head Wo Contrast  Result Date: 06/21/2018 CLINICAL DATA:  Followup intracranial hemorrhage. EXAM: CT HEAD WITHOUT CONTRAST TECHNIQUE: Contiguous axial images were obtained from the base of the skull through the vertex without intravenous contrast. COMPARISON:  06/17/2018 FINDINGS: Brain: The brainstem and cerebellum remain normal. Within the left temporal lobe, there remains visible intraparenchymal hematoma measured at 27 x 25 x 23 mm. When measured in the same locations, this is the same. Surrounding edema remains evident. No significant mass effect or shift. No new bleeding. Small low-density subdural hematoma/hygroma on the right as seen previously, maximal thickness 5 mm. Subdural fluid on the left is diminished. Chronic small-vessel ischemic changes of the hemispheric white matter. No new insult. No hydrocephalus. Vascular: There is atherosclerotic calcification of the major vessels at the base of the brain. Skull: Negative Sinuses/Orbits: Clear/normal Other: None IMPRESSION: No significant change in the left temporal intraparenchymal hematoma with surrounding edema. No significant mass effect or shift. No new bleeding. Low-density right subdural hematoma/hygroma, maximal thickness 5 mm. Electronically Signed   By: Nelson Chimes M.D.   On: 06/21/2018 14:15   Recent Labs    06/21/18 1245  WBC 8.2  HGB 12.2  HCT 37.3  PLT 185   Recent Labs    06/21/18 1245  NA 134*  K 3.9  CL 97*  CO2 24  GLUCOSE 118*  BUN 14  CREATININE 0.83  CALCIUM 9.1    Intake/Output Summary (Last 24 hours) at 06/23/2018 0848 Last data filed at 06/23/2018 0818 Gross per 24 hour  Intake 540 ml  Output -  Net 540 ml     Physical Exam: Vital  Signs Blood pressure (!) 160/66, pulse 72, temperature 98.3 F (36.8 C), temperature source Oral, resp. rate 15, height 5\' 5"  (1.651 m), weight 54.5 kg, SpO2 99 %. Constitutional: No distress . Vital signs reviewed. HENT: Normocephalic.  Atraumatic. Eyes: EOMI. No discharge. Cardiovascular: Irregularly irregular.  No JVD. Respiratory: CTA bilaterally.  Normal effort. GI: BS +. Non-distended. Musc: No edema or tenderness in extremities. Neurological: Alert Global aphasia HOH Delays in processing.   Motor: Bilateral UE 4+/5 proximal to distal, unable to participate RLE: 4/5 proximal to distal, unable to participate LLE: 4-/5 prox to distal, when able to participate Skin: Skin iswarm. Left hip with staples C/D/I Psychiatric: Confused, tangential, and disoriented  Assessment/Plan: 1. Functional deficits secondary to TBI, left fnf  which require 3+ hours per day of interdisciplinary therapy in a comprehensive inpatient rehab setting.  Physiatrist is providing close team supervision and 24 hour management of active medical problems listed below.  Physiatrist and rehab team continue to assess barriers to discharge/monitor patient progress toward functional and medical goals  Care Tool:  Bathing    Body parts bathed by patient: Right arm, Left arm, Chest, Abdomen, Front perineal area, Buttocks, Right upper leg, Left upper leg, Right lower leg, Left lower leg, Face         Bathing assist Assist Level: Contact Guard/Touching assist     Upper Body Dressing/Undressing Upper body dressing   What is the patient wearing?: Button up shirt    Upper body assist Assist Level: Supervision/Verbal cueing    Lower Body Dressing/Undressing Lower body dressing      What is the  patient wearing?: Underwear/pull up, Pants     Lower body assist Assist for lower body dressing: Contact Guard/Touching assist     Toileting Toileting Toileting Activity did not occur (Clothing management and  hygiene only): Safety/medical concerns  Toileting assist Assist for toileting: Independent with assistive device Assistive Device Comment: walker   Transfers Chair/bed transfer  Transfers assist  Chair/bed transfer activity did not occur: Safety/medical concerns  Chair/bed transfer assist level: Contact Guard/Touching assist     Locomotion Ambulation   Ambulation assist      Assist level: Contact Guard/Touching assist Assistive device: Walker-rolling Max distance: 175   Walk 10 feet activity   Assist     Assist level: Contact Guard/Touching assist Assistive device: Walker-rolling   Walk 50 feet activity   Assist    Assist level: Contact Guard/Touching assist Assistive device: Walker-rolling    Walk 150 feet activity   Assist Walk 150 feet activity did not occur: Safety/medical concerns  Assist level: Contact Guard/Touching assist Assistive device: Walker-rolling    Walk 10 feet on uneven surface  activity   Assist Walk 10 feet on uneven surfaces activity did not occur: Safety/medical concerns         Wheelchair     Assist Will patient use wheelchair at discharge?: No   Wheelchair activity did not occur: N/A         Wheelchair 50 feet with 2 turns activity    Assist    Wheelchair 50 feet with 2 turns activity did not occur: N/A       Wheelchair 150 feet activity     Assist Wheelchair 150 feet activity did not occur: N/A         Medical Problem List and Plan: 1.Decreased functional mobilitysecondary to traumatic SAH/TBI and left femoral neck fracture. Status post ORIF 06/13/2018.  Continue CIR  Follow-up cranial CT scan 12/18 reviewed, stable.   Weightbearing as tolerated 2. DVT Prophylaxis/Anticoagulation: SCDs. Dopplers limited, but no DVT noted 3. Pain Management:Robaxin as needed 4. Mood:Aricept 5 mg daily, Risperdal 1 mg daily 5. Neuropsych: This patientis ?fully capable of making decisions on herown  behalf. 6. Skin/Wound Care:Routine skin checks 7. Fluids/Electrolytes/Nutrition:Routine in and out's  Variable nutritional intake on 12/20 8. Seizure prophylaxis. Keppra 500 mg twice a day 9. Hypothyroidism. Synthroid 10. Constipation. Laxative assistance 11.  Labile blood pressure  Labile on 12/20  Lisinopril 5 started on 12/18  Continue to monitor 12.  Hypokalemia: Resolved  Potassium 3.9 on 12/18  Continue to monitor 13.  Acute blood loss anemia: Resolved  Hemoglobin 12.2 on 12/18  Labs ordered for Monday  Continue to monitor 14.  Hypoalbuminemia  Supplement initiated on 12/16 15.  Sleep disturbance  EKG on 12/11 reviewed, trazodone started on 12/17, however not given, changed to as needed  May try tramadol at night as patient used to get good relief of pain and sleep with this medication 16.  Hyponatremia  Sodium 134 on 12/18  Labs ordered for Monday  Continue to monitor 17.  Nonresponsiveness  CT stable, EEG negative, labs relatively unremarkable  Suspect related to hydration status and orthostasis  Teds/abdominal binder as needed   LOS: 7 days A FACE TO FACE EVALUATION WAS PERFORMED  Breena Bevacqua Lorie Phenix 06/23/2018, 8:48 AM

## 2018-06-24 ENCOUNTER — Inpatient Hospital Stay (HOSPITAL_COMMUNITY): Payer: Medicare Other

## 2018-06-24 LAB — URINALYSIS, COMPLETE (UACMP) WITH MICROSCOPIC
Bilirubin Urine: NEGATIVE
Glucose, UA: NEGATIVE mg/dL
Ketones, ur: NEGATIVE mg/dL
Nitrite: NEGATIVE
PROTEIN: NEGATIVE mg/dL
Specific Gravity, Urine: 1.015 (ref 1.005–1.030)
pH: 6 (ref 5.0–8.0)

## 2018-06-24 MED ORDER — RISPERIDONE 0.5 MG PO TABS
0.5000 mg | ORAL_TABLET | Freq: Every day | ORAL | Status: DC
  Administered 2018-06-24 – 2018-07-05 (×12): 0.5 mg via ORAL
  Filled 2018-06-24 (×12): qty 1

## 2018-06-24 NOTE — Progress Notes (Signed)
Misty Carroll is a 84 y.o. female [birth year] [id number]  Subjective: No new complaints from the patient.  Per daughter Almyra Free the patient is worse than in the past.  She is less coherent.  She is weaker.  Objective: Vital signs in last 24 hours: Temp:  [97.5 F (36.4 C)-97.7 F (36.5 C)] 97.7 F (36.5 C) (12/21 0508) Pulse Rate:  [66-76] 66 (12/21 0508) Resp:  [16-18] 16 (12/21 0508) BP: (136-166)/(58-77) 158/75 (12/21 0508) SpO2:  [99 %-100 %] 99 % (12/21 0508) Weight change:  Last BM Date: 06/23/18  Intake/Output from previous day: 12/20 0701 - 12/21 0700 In: 31 [P.O.:420] Out: -  Last cbgs: CBG (last 3)  No results for input(s): GLUCAP in the last 72 hours.   Physical Exam General: No apparent distress sitting in a chair.  Flat affect. HEENT: not dry Lungs: Normal effort. Lungs clear to auscultation, no crackles or wheezes. Cardiovascular: Regular rate and rhythm, no edema Abdomen: S/NT/ND; BS(+) Musculoskeletal:  unchanged Neurological: No new neurological deficits Wounds: N/A    Skin: clear  Aging changes Mental state: Alert, marginally cooperative.    Lab Results: BMET    Component Value Date/Time   NA 134 (L) 06/21/2018 1245   NA 132 (L) 11/06/2012 0242   K 3.9 06/21/2018 1245   K 3.7 11/06/2012 0242   CL 97 (L) 06/21/2018 1245   CL 96 (L) 11/06/2012 0242   CO2 24 06/21/2018 1245   CO2 30 11/06/2012 0242   GLUCOSE 118 (H) 06/21/2018 1245   GLUCOSE 126 (H) 11/06/2012 0242   BUN 14 06/21/2018 1245   BUN 17 11/06/2012 0242   CREATININE 0.83 06/21/2018 1245   CREATININE 0.71 11/06/2012 0242   CALCIUM 9.1 06/21/2018 1245   CALCIUM 8.4 (L) 11/06/2012 0242   GFRNONAA >60 06/21/2018 1245   GFRNONAA >60 11/06/2012 0242   GFRAA >60 06/21/2018 1245   GFRAA >60 11/06/2012 0242   CBC    Component Value Date/Time   WBC 8.2 06/21/2018 1245   RBC 3.85 (L) 06/21/2018 1245   HGB 12.2 06/21/2018 1245   HGB 11.9 (L) 11/06/2012 0242   HCT 37.3 06/21/2018  1245   HCT 34.9 (L) 11/06/2012 0242   PLT 185 06/21/2018 1245   PLT 115 (L) 11/06/2012 0242   MCV 96.9 06/21/2018 1245   MCV 91 11/06/2012 0242   MCH 31.7 06/21/2018 1245   MCHC 32.7 06/21/2018 1245   RDW 13.6 06/21/2018 1245   RDW 13.3 11/06/2012 0242   LYMPHSABS 1.2 06/21/2018 1245   LYMPHSABS 1.4 11/06/2012 0242   MONOABS 0.6 06/21/2018 1245   MONOABS 0.4 11/06/2012 0242   EOSABS 0.0 06/21/2018 1245   EOSABS 0.0 11/06/2012 0242   BASOSABS 0.0 06/21/2018 1245   BASOSABS 0.0 11/06/2012 0242    Studies/Results: No results found.  Medications: I have reviewed the patient's current medications.  Assessment/Plan:  1.  Status post SAH/TBI and left femoral neck fracture.  Status post O R I F on 06/13/2018.  CIR 2.  DVT prophylaxis with SCD 3.  Pain management with PRN Robaxin 4.  Mental state decline per daughter Almyra Free.  Head CT on 1218 was unremarkable.  Will try to reduce her risperidone. 5.  Labile blood pressure.  Monitoring blood pressures 6.  Hypokalemia.  Resolved 7.  Acute blood loss anemia.  Monitoring CBC 8.  Hyponatremia.  Monitoring sodium 9.  Monitor responsiveness.  CT stable EEG negative labs were unremarkable.  May need to repeat urine analysis.  Decrease  risperidone. 10.  Dementia.  On Aricept       Length of stay, days: 8  Walker Kehr , MD 06/24/2018, 10:22 AM

## 2018-06-24 NOTE — Progress Notes (Signed)
Occupational Therapy Session Note  Patient Details  Name: Misty Carroll MRN: [id number] Date of Birth: 1932/12/22  Today's Date: 06/24/2018 OT Individual Time: 0933-1000 OT Individual Time Calculation (min): 27 min    Short Term Goals: Week 1:  OT Short Term Goal 1 (Week 1): n/a d/t ELOS OT Short Term Goal 1 - Progress (Week 1): Progressing toward goal  Skilled Therapeutic Interventions/Progress Updates:    1:1. Pt with no c/o pain this morning daughter requesting a shower. Pt very confused and unable to be directed to doffing clothign despite sitting in shower and turning on water. Pt getting aggitated as she does not understand what to do. Pt redirected to recliner and dons UB clothign with set up. LB clothing donned with MOD A for time managemetn threading LLE into pants. Exited session with daughter present and pt seated in reclienr.   Therapy Documentation Precautions:  Precautions Precautions: Fall Restrictions Weight Bearing Restrictions: Yes LLE Weight Bearing: Weight bearing as tolerated General:   Vital Signs:  Pain:   ADL: ADL Grooming: Contact guard Where Assessed-Grooming: Standing at sink Upper Body Bathing: Moderate cueing Where Assessed-Upper Body Bathing: Sitting at sink Lower Body Bathing: Moderate cueing, Contact guard Where Assessed-Lower Body Bathing: Sitting at sink, Standing at sink Upper Body Dressing: Minimal cueing Where Assessed-Upper Body Dressing: Sitting at sink Lower Body Dressing: Moderate assistance Where Assessed-Lower Body Dressing: Standing at sink Toileting: Minimal assistance Where Assessed-Toileting: Bedside Commode Toilet Transfer: Minimal assistance Toilet Transfer Method: Stand pivot Toilet Transfer Equipment: Bedside commode Vision   Perception    Praxis   Exercises:   Other Treatments:     Therapy/Group: Individual Therapy  Tonny Branch 06/24/2018, 10:03 AM

## 2018-06-25 ENCOUNTER — Inpatient Hospital Stay (HOSPITAL_COMMUNITY): Payer: Medicare Other | Admitting: Physical Therapy

## 2018-06-25 NOTE — Plan of Care (Signed)
  Problem: Spiritual Needs Goal: Ability to function at adequate level 06/25/2018 1530 by Ellison Carwin A, LPN Outcome: Progressing 06/25/2018 1528 by Adria Devon, LPN Outcome: Progressing   Problem: Consults Goal: [facility] BRAIN INJURY PATIENT EDUCATION Description Description: See Patient Education module for eduction specifics 06/25/2018 1530 by Adria Devon, LPN Outcome: Progressing 06/25/2018 1528 by Ellison Carwin A, LPN Outcome: Progressing Goal: Skin Care Protocol Initiated - if Braden Score 18 or less Description If consults are not indicated, leave blank or document N/A 06/25/2018 1530 by Adria Devon, LPN Outcome: Progressing 06/25/2018 1528 by Ellison Carwin A, LPN Outcome: Progressing   Problem: RH BOWEL ELIMINATION Goal: RH STG MANAGE BOWEL WITH ASSISTANCE Description STG Manage Bowel with mod Assistance.  06/25/2018 1530 by Adria Devon, LPN Outcome: Progressing 06/25/2018 1528 by Adria Devon, LPN Outcome: Progressing Goal: RH STG MANAGE BOWEL W/MEDICATION W/ASSISTANCE Description STG Manage Bowel with Medication with mod Assistance.  06/25/2018 1530 by Ellison Carwin A, LPN Outcome: Progressing 06/25/2018 1528 by Adria Devon, LPN Outcome: Progressing   Problem: RH SKIN INTEGRITY Goal: RH STG SKIN FREE OF INFECTION/BREAKDOWN Description Patients skin will remain free from further infection or breakdown with mod assist.  06/25/2018 1530 by Adria Devon, LPN Outcome: Progressing 06/25/2018 1528 by Adria Devon, LPN Outcome: Progressing Goal: RH STG MAINTAIN SKIN INTEGRITY WITH ASSISTANCE Description STG Maintain Skin Integrity With mod Assistance.  06/25/2018 1530 by Adria Devon, LPN Outcome: Progressing 06/25/2018 1528 by Ellison Carwin A, LPN Outcome: Progressing   Problem: RH SAFETY Goal: RH STG ADHERE TO SAFETY PRECAUTIONS W/ASSISTANCE/DEVICE Description STG Adhere to Safety  Precautions With mod Assistance/Device.  06/25/2018 1530 by Adria Devon, LPN Outcome: Progressing 06/25/2018 1528 by Ellison Carwin A, LPN Outcome: Progressing Goal: RH STG DECREASED RISK OF FALL WITH ASSISTANCE Description STG Decreased Risk of Fall With mod Assistance.  06/25/2018 1530 by Adria Devon, LPN Outcome: Progressing 06/25/2018 1528 by Adria Devon, LPN Outcome: Progressing   Problem: RH COGNITION-NURSING Goal: RH STG USES MEMORY AIDS/STRATEGIES W/ASSIST TO PROBLEM SOLVE Description STG Uses Memory Aids/Strategies With mod Assistance to Problem Solve.  06/25/2018 1530 by Adria Devon, LPN Outcome: Progressing 06/25/2018 1528 by Adria Devon, LPN Outcome: Progressing Goal: RH STG ANTICIPATES NEEDS/CALLS FOR ASSIST W/ASSIST/CUES Description STG Anticipates Needs/Calls for Assist With mod Assistance/Cues.  06/25/2018 1530 by Adria Devon, LPN Outcome: Progressing 06/25/2018 1528 by Adria Devon, LPN Outcome: Progressing   Problem: RH PAIN MANAGEMENT Goal: RH STG PAIN MANAGED AT OR BELOW PT'S PAIN GOAL Description < 3  06/25/2018 1530 by Adria Devon, LPN Outcome: Progressing 06/25/2018 1528 by Adria Devon, LPN Outcome: Progressing

## 2018-06-25 NOTE — Plan of Care (Signed)
  Problem: Spiritual Needs Goal: Ability to function at adequate level Outcome: Progressing   Problem: Consults Goal: RH BRAIN INJURY PATIENT EDUCATION Description Description: See Patient Education module for eduction specifics Outcome: Progressing Goal: Skin Care Protocol Initiated - if Braden Score 18 or less Description If consults are not indicated, leave blank or document N/A Outcome: Progressing   Problem: RH BOWEL ELIMINATION Goal: RH STG MANAGE BOWEL WITH ASSISTANCE Description STG Manage Bowel with mod Assistance.  Outcome: Progressing Goal: RH STG MANAGE BOWEL W/MEDICATION W/ASSISTANCE Description STG Manage Bowel with Medication with mod Assistance.  Outcome: Progressing   Problem: RH SKIN INTEGRITY Goal: RH STG SKIN FREE OF INFECTION/BREAKDOWN Description Patients skin will remain free from further infection or breakdown with mod assist.  Outcome: Progressing Goal: RH STG MAINTAIN SKIN INTEGRITY WITH ASSISTANCE Description STG Maintain Skin Integrity With mod Assistance.  Outcome: Progressing   Problem: RH SAFETY Goal: RH STG ADHERE TO SAFETY PRECAUTIONS W/ASSISTANCE/DEVICE Description STG Adhere to Safety Precautions With mod Assistance/Device.  Outcome: Progressing Goal: RH STG DECREASED RISK OF FALL WITH ASSISTANCE Description STG Decreased Risk of Fall With mod Assistance.  Outcome: Progressing   Problem: RH COGNITION-NURSING Goal: RH STG USES MEMORY AIDS/STRATEGIES W/ASSIST TO PROBLEM SOLVE Description STG Uses Memory Aids/Strategies With mod Assistance to Problem Solve.  Outcome: Progressing Goal: RH STG ANTICIPATES NEEDS/CALLS FOR ASSIST W/ASSIST/CUES Description STG Anticipates Needs/Calls for Assist With mod Assistance/Cues.  Outcome: Progressing   Problem: RH PAIN MANAGEMENT Goal: RH STG PAIN MANAGED AT OR BELOW PT'S PAIN GOAL Description < 3  Outcome: Progressing

## 2018-06-25 NOTE — Progress Notes (Signed)
Physical Therapy Session Note  Patient Details  Name: Misty Carroll MRN: [id number] Date of Birth: 05/14/1933  Today's Date: 06/25/2018 PT Individual Time: 1017-1100 PT Individual Time Calculation (min): 43 min   Short Term Goals: Week 2:  LTGs=STGs     Skilled Therapeutic Interventions/Progress Updates: Pt presented in recliner with dgt present agreeable to therapy. Nsg also arrived to administer meds. Pt denies pain at beginning of session. While preparing meds pt participated in STS for BLE strengthening x 5 with cues for hand placement upon descent. Once received meds pt ambulated to rehab gym with RW and CGA/close supervision with shortened step length and narrow BOS. PTA noted that with fatigue during ambulation, pt with increased knee flexion. After seated rest participated in peg board activity while standing on Airex for balance and standing tolerance. Pt required assist for correct peg placement approx 50%. Pt indicating pain in LLE near knee. Pt performed ambulatory transfer to w/c with RW CGA and performed w/c propulsion approx 39f for endurance with encouragement. Pt transported remaining distance and performed ambulatory transfer to recliner with RW in same manner as prior. Pt left in recliner at end of session with dgt present and needs met.      Therapy Documentation Precautions:  Precautions Precautions: Fall Restrictions Weight Bearing Restrictions: No LLE Weight Bearing: Weight bearing as tolerated    Therapy/Group: Individual Therapy  Shelvy Heckert  Adonis Yim, PTA  06/25/2018, 3:17 PM

## 2018-06-25 NOTE — Progress Notes (Signed)
DEVONIA FARRO is a 84 y.o. female 1933/05/09 [id number]  Subjective:  No new problems. Feeling OK.  Objective: Vital signs in last 24 hours: Temp:  [97.5 F (36.4 C)-97.6 F (36.4 C)] 97.6 F (36.4 C) (12/22 0334) Pulse Rate:  [67-79] 67 (12/22 0334) Resp:  [16-18] 18 (12/22 0334) BP: (126-159)/(69-76) 150/69 (12/22 0334) SpO2:  [97 %-100 %] 98 % (12/22 0334) Weight change:  Last BM Date: 06/23/18  Intake/Output from previous day: 12/21 0701 - 12/22 0700 In: 855 [P.O.:180; I.V.:675] Out: 475 [Urine:475] Last cbgs: CBG (last 3)  No results for input(s): GLUCAP in the last 72 hours.   Physical Exam General: No apparent distress -sitting in the chair.  No family present at bedside HEENT: not dry Lungs: Normal effort. Lungs clear to auscultation, no crackles or wheezes. Cardiovascular: Regular rate and rhythm, no edema Abdomen: S/NT/ND; BS(+) Musculoskeletal:  unchanged Neurological: No new neurological deficits Wounds: N/A    Skin: clear  Aging changes Mental state: Alert, somewhat cooperative.  Aphasic    Lab Results: BMET    Component Value Date/Time   NA 134 (L) 06/21/2018 1245   NA 132 (L) 11/06/2012 0242   K 3.9 06/21/2018 1245   K 3.7 11/06/2012 0242   CL 97 (L) 06/21/2018 1245   CL 96 (L) 11/06/2012 0242   CO2 24 06/21/2018 1245   CO2 30 11/06/2012 0242   GLUCOSE 118 (H) 06/21/2018 1245   GLUCOSE 126 (H) 11/06/2012 0242   BUN 14 06/21/2018 1245   BUN 17 11/06/2012 0242   CREATININE 0.83 06/21/2018 1245   CREATININE 0.71 11/06/2012 0242   CALCIUM 9.1 06/21/2018 1245   CALCIUM 8.4 (L) 11/06/2012 0242   GFRNONAA >60 06/21/2018 1245   GFRNONAA >60 11/06/2012 0242   GFRAA >60 06/21/2018 1245   GFRAA >60 11/06/2012 0242   CBC    Component Value Date/Time   WBC 8.2 06/21/2018 1245   RBC 3.85 (L) 06/21/2018 1245   HGB 12.2 06/21/2018 1245   HGB 11.9 (L) 11/06/2012 0242   HCT 37.3 06/21/2018 1245   HCT 34.9 (L) 11/06/2012 0242   PLT 185  06/21/2018 1245   PLT 115 (L) 11/06/2012 0242   MCV 96.9 06/21/2018 1245   MCV 91 11/06/2012 0242   MCH 31.7 06/21/2018 1245   MCHC 32.7 06/21/2018 1245   RDW 13.6 06/21/2018 1245   RDW 13.3 11/06/2012 0242   LYMPHSABS 1.2 06/21/2018 1245   LYMPHSABS 1.4 11/06/2012 0242   MONOABS 0.6 06/21/2018 1245   MONOABS 0.4 11/06/2012 0242   EOSABS 0.0 06/21/2018 1245   EOSABS 0.0 11/06/2012 0242   BASOSABS 0.0 06/21/2018 1245   BASOSABS 0.0 11/06/2012 0242    Studies/Results: No results found.  Medications: I have reviewed the patient's current medications.  Assessment/Plan:   1.  Status post SAH/TBI and left femoral neck fracture.  Status post ORIF on 06/13/2018.  Continue with CIR 2.  DVT prophylaxis with SCD 3.  Pain management with PRN Robaxin and Tylenol 4.  Mental status changes.  We did do start risperidone yesterday.  To make sure looks little more alert.  We will continue to observe.  Recent head CT was stable.  EEG was negative.  Labs on Monday. 5.  Labile blood pressure.  Continue to monitor 6.  Hypokalemia.  Resolved 7.  Acute blood loss anemia.  We have been monitoring CBC 8.  Hyponatremia. BMET on Monday 9.  Dementia.  On Aricept    Length of stay, days:  [place] , MD 06/25/2018, 11:06 AM

## 2018-06-26 ENCOUNTER — Inpatient Hospital Stay (HOSPITAL_COMMUNITY): Payer: Medicare Other

## 2018-06-26 ENCOUNTER — Inpatient Hospital Stay (HOSPITAL_COMMUNITY): Payer: Medicare Other | Admitting: Speech Pathology

## 2018-06-26 ENCOUNTER — Inpatient Hospital Stay (HOSPITAL_COMMUNITY): Payer: Medicare Other | Admitting: Physical Therapy

## 2018-06-26 ENCOUNTER — Inpatient Hospital Stay (HOSPITAL_COMMUNITY): Payer: Medicare Other | Admitting: Occupational Therapy

## 2018-06-26 LAB — CBC WITH DIFFERENTIAL/PLATELET
Abs Immature Granulocytes: 0.02 10*3/uL (ref 0.00–0.07)
BASOS ABS: 0 10*3/uL (ref 0.0–0.1)
Basophils Relative: 1 %
Eosinophils Absolute: 0.1 10*3/uL (ref 0.0–0.5)
Eosinophils Relative: 1 %
HCT: 37.1 % (ref 36.0–46.0)
Hemoglobin: 12.2 g/dL (ref 12.0–15.0)
Immature Granulocytes: 0 %
Lymphocytes Relative: 18 %
Lymphs Abs: 0.9 10*3/uL (ref 0.7–4.0)
MCH: 31.8 pg (ref 26.0–34.0)
MCHC: 32.9 g/dL (ref 30.0–36.0)
MCV: 96.6 fL (ref 80.0–100.0)
Monocytes Absolute: 0.4 10*3/uL (ref 0.1–1.0)
Monocytes Relative: 7 %
Neutro Abs: 3.5 10*3/uL (ref 1.7–7.7)
Neutrophils Relative %: 73 %
Platelets: 255 10*3/uL (ref 150–400)
RBC: 3.84 MIL/uL — ABNORMAL LOW (ref 3.87–5.11)
RDW: 13.5 % (ref 11.5–15.5)
WBC: 4.8 10*3/uL (ref 4.0–10.5)
nRBC: 0 % (ref 0.0–0.2)

## 2018-06-26 LAB — BASIC METABOLIC PANEL
ANION GAP: 10 (ref 5–15)
BUN: 15 mg/dL (ref 8–23)
CO2: 28 mmol/L (ref 22–32)
Calcium: 8.9 mg/dL (ref 8.9–10.3)
Chloride: 96 mmol/L — ABNORMAL LOW (ref 98–111)
Creatinine, Ser: 0.89 mg/dL (ref 0.44–1.00)
GFR calc non Af Amer: 60 mL/min — ABNORMAL LOW (ref >60)
Glucose, Bld: 188 mg/dL — ABNORMAL HIGH (ref 70–99)
Potassium: 2.8 mmol/L — ABNORMAL LOW (ref 3.5–5.1)
SODIUM: 134 mmol/L — AB (ref 135–145)

## 2018-06-26 NOTE — Progress Notes (Addendum)
Physical Therapy Session Note  Patient Details  Name: Misty Carroll MRN: [id number] Date of Birth: [birth year]  Today's Date: 06/26/2018 PT Individual Time: 1405-1450 PT Individual Time Calculation (min): 45 min   Short Term Goals: Week 1:  PT Short Term Goal 1 (Week 1): =LTGs due to ELOS Week 2:  PT Short Term Goal 1 (Week 2): =LTG due to estimated LOS  Skilled Therapeutic Interventions/Progress Updates:   Pt resting in recliner, with dtr [name] present.  PROM bil heel cords.  R hamstring noted to be tight. Sit<> stand with close supervsion.   Gait training on level tile with RW x 100' x 2 during session, contact guard assist. Up/down ramp x 2 with close supervision/CGA, RW.  Narrow BOS especially on ramp.  Advanced gait training, attempting to kick Yoga block with L foot or R foot; pt "nudged" block as she approached it, but never flexed R or L knee to lift foot off of floor to kick.  Simulated car transfer with min assist, for hand over hand placement of her hand to reach back before sitting, and push up before standing.  Pt tends to attempt pulling up with bil hands on RW  Sit> supine with supervision.  Pt left resting in bed with alarm set, needs at hand, and dtr present.    Therapy Documentation Precautions:  Precautions Precautions: Fall Restrictions Weight Bearing Restrictions: No LLE Weight Bearing: Weight bearing as tolerated  Pain: no S/S at rest         Therapy/Group: Individual Therapy  [name] 06/26/2018, 2:52 PM

## 2018-06-26 NOTE — Progress Notes (Signed)
Speech Language Pathology Daily Session Note  Patient Details  Name: Misty Carroll MRN: [id number] Date of Birth: [birth year]  Today's Date: 06/26/2018 SLP Individual Time: 0700-0745 SLP Individual Time Calculation (min): 45 min  Short Term Goals: Week 2: SLP Short Term Goal 1 (Week 2): Length of stay now longer than originally anticipated due to medical issues with decline in function, continue working towards LTG  Skilled Therapeutic Interventions:  Skilled treatment session focused on cognition goals. SLP facilitated session by providing Max A multimodal cues for sustained attention to task for self-feeding and tray set-up. Pt confused as to place and current situation with redirection and education provided. Despite this, pt with difficult performing any tasks that weren't related to eating. Pt with minimal consumption of breakfast -stated that she had "just eaten." Pt left upright in bed, bed alarm on and all needs within reach. Education provided to nursing minimal intake. Continue per current plan of care.      Pain Pain Assessment Pain Scale: 0-10 Pain Score: 0-No pain  Therapy/Group: Individual Therapy  Misty Carroll 06/26/2018, 9:14 AM

## 2018-06-26 NOTE — Progress Notes (Signed)
Physical Therapy Session Note  Patient Details  Name: Misty Carroll MRN: [id number] Date of Birth: [birth year]  Today's Date: 06/26/2018 PT Individual Time: 1300-1355 PT Individual Time Calculation (min): 55 min   Short Term Goals: Week 2:  PT Short Term Goal 1 (Week 2): =LTG due to estimated LOS  Skilled Therapeutic Interventions/Progress Updates: Pt received ambulating in room with daughter; denies pain and agreeable to treatment. Gait throughout hospital including on/off elevators, throughout gift shop with RW and daughter providing S; min cues for maintaining RW in front of body while turning before sitting. Standing balance on airex foam pad with no UE support while engaged in horseshoe toss for focus on ankle strategy; pt hesitant to remove UE support and tolerates unsupported standing <30 sec before reporting she can't do any more. Completed horseshoe game with daughter in sitting. Gait to return to room with RW and S; max cues for locating room. Remained seated in recliner at end of session, all needs in reach.      Therapy Documentation Precautions:  Precautions Precautions: Fall Restrictions Weight Bearing Restrictions: No LLE Weight Bearing: Weight bearing as tolerated    Therapy/Group: Individual Therapy  Corliss Skains 06/26/2018, 1:56 PM

## 2018-06-26 NOTE — Progress Notes (Signed)
Social Work Patient ID: Misty Carroll, female   DOB: [birth year], 84 y.o.   MRN: [id number] Met with pt and daughter to see how she is doing and to discuss team conference tomorrow. Aware she will need to have 24 hr supervision, daughter seems to not want to talk in front of her Mom/pt.

## 2018-06-26 NOTE — Progress Notes (Signed)
Occupational Therapy Session Note  Patient Details  Name: Misty Carroll MRN: [id number] Date of Birth: 1933-02-26  Today's Date: 06/26/2018 OT Individual Time: 1000-1100 OT Individual Time Calculation (min): 60 min    Short Term Goals: Week 2:  OT Short Term Goal 1 (Week 2): Cont to work towards LTG of supervision overall  Skilled Therapeutic Interventions/Progress Updates:    Pt seen for OT ADL bathing/dressing session. Pt sitting on toilet upon arrival with daughter present  Assisting, had been cleared by therapy earlier in admission to assist. Throughout session, pt ambulated with RW and close supervision with intermitent physical assist requiring for negotiating RW during functional ambulation and transfers.  Pt agreeable to showering this morning. She ambulated to gather clothing items, requiring min VCs to gather all needed items. She bathed seated on tub transfer bench, able to bathe at supervision level overall with mod-max cuing for sequencing and standing with use of grab bars to complete pericare/buttock hygiene. She returned to w/c to dress, min A for clothing management and steadying assist when standing to pull pants up. With increased time able to correctly orient and don all UB dressing without cues.  Pt returned to bathroom for toileting task, VCs and set-up for sequencing of hygiene following BM. Pt with reports of dizziness upon standing at toilet, however, unable to elaborate further. Pt placed in w/c and BP assessed, hand off to RN due to time. Pt left seated in w/c in care of daughter and RN. Throughout session, education provided to pt's daughter regarding importance of participation and independence with ADLs, continuum of care, and d/c planning. Attempted to assess orientation, however, unable to do due pt's expressive aphasia and dysarthria.   Therapy Documentation Precautions:  Precautions Precautions: Fall Restrictions Weight Bearing Restrictions: No LLE Weight  Bearing: Weight bearing as tolerated   Therapy/Group: Individual Therapy  Naseer Hearn L 06/26/2018, 6:35 AM

## 2018-06-26 NOTE — Progress Notes (Signed)
[place] PHYSICAL MEDICINE & REHABILITATION PROGRESS NOTE   Subjective/Complaints: Patient seen ambulating back to chair from restroom with daughter.  She indicates she slept well overnight and had a good weekend.  No reported issues per daughter.  ROS: Limited due to cognition.  Objective:   No results found. No results for input(s): WBC, HGB, HCT, PLT in the last 72 hours. No results for input(s): NA, K, CL, CO2, GLUCOSE, BUN, CREATININE, CALCIUM in the last 72 hours.  Intake/Output Summary (Last 24 hours) at 06/26/2018 1001 Last data filed at 06/26/2018 0600 Gross per 24 hour  Intake 1185 ml  Output -  Net 1185 ml     Physical Exam: Vital Signs Blood pressure (!) 141/64, pulse 64, temperature 97.9 F (36.6 C), temperature source Oral, resp. rate 18, height 5\' 5"  (1.651 m), weight 54.5 kg, SpO2 98 %. Constitutional: No distress . Vital signs reviewed. HENT: Normocephalic.  Atraumatic. Eyes: EOMI. No discharge. Cardiovascular: Irregularly irregular.  No JVD. Respiratory: CTA bilaterally.  Normal effort. GI: BS +. Non-distended. Musc: No edema or tenderness in extremities. Neurological: Alert and oriented to person only Global aphasia HOH Delays in processing.   Motor: Bilateral UE 4+/5 proximal to distal, stable RLE: 4/5 proximal to distal, stable LLE: 4-/5 prox to distal Skin: Skin iswarm. Left hip with staples C/D/I Psychiatric: Confused, tangential, and disoriented  Assessment/Plan: 1. Functional deficits secondary to TBI, left fnf  which require 3+ hours per day of interdisciplinary therapy in a comprehensive inpatient rehab setting.  Physiatrist is providing close team supervision and 24 hour management of active medical problems listed below.  Physiatrist and rehab team continue to assess barriers to discharge/monitor patient progress toward functional and medical goals  Care Tool:  Bathing    Body parts bathed by patient: Right arm, Left arm, Chest,  Abdomen, Front perineal area, Buttocks, Right upper leg, Left upper leg, Right lower leg, Left lower leg, Face         Bathing assist Assist Level: Contact Guard/Touching assist     Upper Body Dressing/Undressing Upper body dressing   What is the patient wearing?: Button up shirt    Upper body assist Assist Level: Supervision/Verbal cueing    Lower Body Dressing/Undressing Lower body dressing      What is the patient wearing?: Underwear/pull up, Pants     Lower body assist Assist for lower body dressing: Contact Guard/Touching assist     Toileting Toileting Toileting Activity did not occur (Clothing management and hygiene only): Safety/medical concerns  Toileting assist Assist for toileting: Contact Guard/Touching assist Assistive Device Comment: (BSC)   Transfers Chair/bed transfer  Transfers assist  Chair/bed transfer activity did not occur: Safety/medical concerns  Chair/bed transfer assist level: Contact Guard/Touching assist     Locomotion Ambulation   Ambulation assist      Assist level: Contact Guard/Touching assist Assistive device: Walker-rolling Max distance: 175   Walk 10 feet activity   Assist     Assist level: Contact Guard/Touching assist Assistive device: Walker-rolling   Walk 50 feet activity   Assist    Assist level: Contact Guard/Touching assist Assistive device: Walker-rolling    Walk 150 feet activity   Assist Walk 150 feet activity did not occur: Safety/medical concerns  Assist level: Contact Guard/Touching assist Assistive device: Walker-rolling    Walk 10 feet on uneven surface  activity   Assist Walk 10 feet on uneven surfaces activity did not occur: Safety/medical concerns         Wheelchair  Assist Will patient use wheelchair at discharge?: No   Wheelchair activity did not occur: N/A         Wheelchair 50 feet with 2 turns activity    Assist    Wheelchair 50 feet with 2 turns  activity did not occur: N/A       Wheelchair 150 feet activity     Assist Wheelchair 150 feet activity did not occur: N/A         Medical Problem List and Plan: 1.Decreased functional mobilitysecondary to traumatic SAH/TBI and left femoral neck fracture. Status post ORIF 06/13/2018.  Continue CIR  Follow-up cranial CT scan 12/18 reviewed, stable.   Weightbearing as tolerated  Weekend notes reviewed  DC hip staples 2. DVT Prophylaxis/Anticoagulation: SCDs. Dopplers limited, but no DVT noted 3. Pain Management:Robaxin as needed 4. Mood:Aricept 5 mg daily, Risperdal 1 mg daily 5. Neuropsych: This patientis not capable of making decisions on herown behalf. 6. Skin/Wound Care:Routine skin checks 7. Fluids/Electrolytes/Nutrition:Routine in and out's  Variable nutritional intake on 12/20 8. Seizure prophylaxis. Keppra 500 mg twice a day 9. Hypothyroidism. Synthroid 10. Constipation. Laxative assistance 11.  Labile blood pressure  Labile on 12/23, will not make any adjustments at this time due to symptomatic orthostasis  Continue to monitor 12.  Hypokalemia: Resolved  Potassium 3.9 on 12/18  Continue to monitor 13.  Acute blood loss anemia: Resolved  Hemoglobin 12.2 on 12/18  Labs pending  Continue to monitor 14.  Hypoalbuminemia  Supplement initiated on 12/16 15.  Sleep disturbance  EKG on 12/11 reviewed, trazodone started on 12/17, however not given, changed to as needed  May try tramadol at night as patient used to get good relief of pain and sleep with this medication 16.  Hyponatremia  Sodium 134 on 12/18  Labs pending  Continue to monitor 17.  Nonresponsiveness  CT stable, EEG negative, labs relatively unremarkable  Suspect related to hydration status and orthostasis  Teds/abdominal binder as needed   LOS: 10 days A FACE TO FACE EVALUATION WAS PERFORMED  Ankit Lorie Phenix 06/26/2018, 10:01 AM

## 2018-06-27 ENCOUNTER — Inpatient Hospital Stay (HOSPITAL_COMMUNITY): Payer: Medicare Other | Admitting: Occupational Therapy

## 2018-06-27 ENCOUNTER — Inpatient Hospital Stay (HOSPITAL_COMMUNITY): Payer: Medicare Other | Admitting: Speech Pathology

## 2018-06-27 ENCOUNTER — Inpatient Hospital Stay (HOSPITAL_COMMUNITY): Payer: Medicare Other | Admitting: Physical Therapy

## 2018-06-27 LAB — BASIC METABOLIC PANEL
Anion gap: 11 (ref 5–15)
BUN: 15 mg/dL (ref 8–23)
CO2: 26 mmol/L (ref 22–32)
Calcium: 8.9 mg/dL (ref 8.9–10.3)
Chloride: 98 mmol/L (ref 98–111)
Creatinine, Ser: 0.55 mg/dL (ref 0.44–1.00)
GFR calc Af Amer: >60 mL/min (ref >60)
GFR calc non Af Amer: >60 mL/min (ref >60)
Glucose, Bld: 132 mg/dL — ABNORMAL HIGH (ref 70–99)
Potassium: 3.1 mmol/L — ABNORMAL LOW (ref 3.5–5.1)
Sodium: 135 mmol/L (ref 135–145)

## 2018-06-27 LAB — MAGNESIUM: MAGNESIUM: 1.7 mg/dL (ref 1.7–2.4)

## 2018-06-27 MED ORDER — POTASSIUM CHLORIDE CRYS ER 20 MEQ PO TBCR
40.0000 meq | EXTENDED_RELEASE_TABLET | Freq: Two times a day (BID) | ORAL | Status: AC
  Administered 2018-06-27 – 2018-06-28 (×4): 40 meq via ORAL
  Filled 2018-06-27 (×4): qty 2

## 2018-06-27 NOTE — Progress Notes (Signed)
[place] PHYSICAL MEDICINE & REHABILITATION PROGRESS NOTE   Subjective/Complaints: Patient seen laying in bed this morning.  She states she slept well overnight.  No reported issues.  She is sleepy in difficult to arouse this morning.  ROS: Limited due to cognition.  Objective:   No results found. Recent Labs    06/26/18 0918  WBC 4.8  HGB 12.2  HCT 37.1  PLT 255   Recent Labs    06/26/18 0918  NA 134*  K 2.8*  CL 96*  CO2 28  GLUCOSE 188*  BUN 15  CREATININE 0.89  CALCIUM 8.9    Intake/Output Summary (Last 24 hours) at 06/27/2018 0816 Last data filed at 06/27/2018 0600 Gross per 24 hour  Intake 1145 ml  Output -  Net 1145 ml     Physical Exam: Vital Signs Blood pressure (!) 144/77, pulse 73, temperature 98.2 F (36.8 C), temperature source Oral, resp. rate 14, height 5\' 5"  (1.651 m), weight 54.5 kg, SpO2 98 %. Constitutional: No distress . Vital signs reviewed. HENT: Normocephalic.  Atraumatic. Eyes: EOMI. No discharge. Cardiovascular: Irregularly irregular.  No JVD. Respiratory: CTA bilaterally.  Normal effort. GI: BS +. Non-distended. Musc: No edema or tenderness in extremities. Neurological: Somnolent this morning and difficult to arouse or sustained period. Global aphasia HOH Delays in processing.   Motor: Bilateral UE 4+/5 proximal to distal, when willing to participate RLE: 4/5 proximal to distal, when willing to participate LLE: 4-/5 prox to distal, when willing to participate Skin: Skin iswarm. Psychiatric: Confused, tangential, and disoriented  Assessment/Plan: 1. Functional deficits secondary to TBI, left fnf  which require 3+ hours per day of interdisciplinary therapy in a comprehensive inpatient rehab setting.  Physiatrist is providing close team supervision and 24 hour management of active medical problems listed below.  Physiatrist and rehab team continue to assess barriers to discharge/monitor patient progress toward functional  and medical goals  Care Tool:  Bathing    Body parts bathed by patient: Right arm, Left arm, Chest, Abdomen, Front perineal area, Buttocks, Right upper leg, Left upper leg, Right lower leg, Left lower leg, Face         Bathing assist Assist Level: Contact Guard/Touching assist     Upper Body Dressing/Undressing Upper body dressing   What is the patient wearing?: Pull over shirt, Bra    Upper body assist Assist Level: Supervision/Verbal cueing    Lower Body Dressing/Undressing Lower body dressing      What is the patient wearing?: Underwear/pull up, Pants     Lower body assist Assist for lower body dressing: Minimal Assistance - Patient > 75%     Toileting Toileting Toileting Activity did not occur Landscape architect and hygiene only): Safety/medical concerns  Toileting assist Assist for toileting: Contact Guard/Touching assist Assistive Device Comment: BSC   Transfers Chair/bed transfer  Transfers assist  Chair/bed transfer activity did not occur: Safety/medical concerns  Chair/bed transfer assist level: Supervision/Verbal cueing     Locomotion Ambulation   Ambulation assist      Assist level: Contact Guard/Touching assist Assistive device: Walker-rolling Max distance: 100   Walk 10 feet activity   Assist     Assist level: Contact Guard/Touching assist Assistive device: Walker-rolling   Walk 50 feet activity   Assist    Assist level: Supervision/Verbal cueing Assistive device: Walker-rolling    Walk 150 feet activity   Assist Walk 150 feet activity did not occur: Safety/medical concerns  Assist level: Supervision/Verbal cueing Assistive device: Walker-rolling  Walk 10 feet on uneven surface  activity   Assist Walk 10 feet on uneven surfaces activity did not occur: Safety/medical concerns         Wheelchair     Assist Will patient use wheelchair at discharge?: No   Wheelchair activity did not occur: N/A          Wheelchair 50 feet with 2 turns activity    Assist    Wheelchair 50 feet with 2 turns activity did not occur: N/A       Wheelchair 150 feet activity     Assist Wheelchair 150 feet activity did not occur: N/A         Medical Problem List and Plan: 1.Decreased functional mobilitysecondary to traumatic SAH/TBI and left femoral neck fracture. Status post ORIF 06/13/2018.  Continue CIR  Follow-up cranial CT scan 12/18 reviewed, stable.   Weightbearing as tolerated 2. DVT Prophylaxis/Anticoagulation: SCDs. Dopplers limited, but no DVT noted 3. Pain Management:Robaxin as needed 4. Mood:Aricept 5 mg daily, Risperdal 1 mg daily 5. Neuropsych: This patientis not capable of making decisions on herown behalf. 6. Skin/Wound Care:Routine skin checks 7. Fluids/Electrolytes/Nutrition:Routine in and out's  Variable nutritional intake on 12/24 8. Seizure prophylaxis. Keppra 500 mg twice a day 9. Hypothyroidism. Synthroid 10. Constipation. Laxative assistance 11.  Labile blood pressure  Labile on 12/24, will not make any adjustments at this time due to symptomatic orthostasis  Continue to monitor 12.  Hypokalemia:   Potassium 2.8 on 12/23  Supplement order x2 days  Repeat stat labs today and routine labs tomorrow  Magnesium ordered for today  Continue to monitor 13.  Acute blood loss anemia: Resolved  Hemoglobin 12.2 on 12/23  Continue to monitor 14.  Hypoalbuminemia  Supplement initiated on 12/16 15.  Sleep disturbance  EKG on 12/11 reviewed, trazodone started on 12/17, however not given, changed to as needed  May try tramadol at night as patient used to get good relief of pain and sleep with this medication 16.  Hyponatremia  Sodium 134 on 12/23  Continue to monitor 17.  Nonresponsiveness  CT stable, EEG negative, labs relatively unremarkable  Suspect related to hydration status and orthostasis  Teds/abdominal binder as needed 18.  Hyponatremia  Sodium  134 on 12/23  Continue to monitor   LOS: 11 days A FACE TO FACE EVALUATION WAS PERFORMED  Marylouise Mallet Lorie Phenix 06/27/2018, 8:16 AM

## 2018-06-27 NOTE — Progress Notes (Signed)
Physical Therapy Session Note  Patient Details  Name: Misty Carroll MRN: [id number] Date of Birth: Feb 13, 1933  Today's Date: 06/27/2018 PT Individual Time: 1100-1145 PT Individual Time Calculation (min): 45 min   Short Term Goals: Week 2:  PT Short Term Goal 1 (Week 2): =LTG due to estimated LOS  Skilled Therapeutic Interventions/Progress Updates: Pt received seated in recliner, denies pain and agreeable to treatment. Pt's daughter ambulated with pt ~100' at a time with RW and close S/min guard. Performed car transfer with S, min cues for technique as pt initially attempting to lift LLE into vehicle before sitting; improved safety with pivot to sit on chair then swing legs in. Discussed equipment needs with pt and daughter; per daughter they have a walker at home but it may be a bariatric/wide walker, and daughter plans to measure and assess fit based on current walker measurement of 20" width. Discussed w/c vs transfer chair, and potential need for either of these for community activities as pt is limited with distance she is able to ambulate secondary to pain and fatigue. Ascent/descent 12 steps at 3"/6" height steps with B handrails and min guard, reciprocal pattern. Standing heel raises x10 reps, hip flexion marching with visual target of step to increase excursion x10 reps BLE. Standing balance with ball toss to/from daughter; performed lateral stepping, forward/backward stepping with ball toss, small steps for focus on direction changes, hip abduction strengthening, retro walking for balance reactions. Pt shot ball at baskeball hoop x10 attempts; pt adopts natural staggered stance and overhand shot at hoop, daughter repots she enjoyed playing baskeball as an adult recreationally. Pt declines further participation, reports she is very tired and had two sessions back to back this morning. Returned pt to room totalA in w/c for energy conservation. Daughter provided min guard for stand pivot w/c  >recliner; remained in recliner with daughter present and all needs in reach at completion of session.      Therapy Documentation Precautions:  Precautions Precautions: Fall Restrictions Weight Bearing Restrictions: No LLE Weight Bearing: Weight bearing as tolerated General: PT Amount of Missed Time (min): 15 Minutes PT Missed Treatment Reason: Patient fatigue    Therapy/Group: Individual Therapy  Corliss Skains 06/27/2018, 11:46 AM

## 2018-06-27 NOTE — Progress Notes (Addendum)
Occupational Therapy Session Note  Patient Details  Name: Misty Carroll MRN: [id number] Date of Birth: March 08, 1933  Today's Date: 06/27/2018 OT Individual Time: 1000-1100 and 1400-1430 OT Individual Time Calculation (min): 60 min and 30 min   Short Term Goals: Week 2:  OT Short Term Goal 1 (Week 2): Cont to work towards LTG of supervision overall  Skilled Therapeutic Interventions/Progress Updates:    Session One: Pt seen for OT session focusing on ADL re-training. Pt sitting up in recliner upon arrival with daughter present. Pt alert and voiced agreeableness to therapy session this morning. With encouragement, reassurance and CGA. Pt required mod cuing throughout for recalling purppose of being at drawers and being sure to obtain all needs items. She returned to w/c to dress, increased time for initiation and completing task, however, able to complete with close supervision and intermittent cuing for clothing orientation and activity progression. UB dressing completed with supervision, LB dressing min A for donning shoes.  Pt voiced pain in R hip following extended standing trial, RN made aware and administered pain medication during session. She ambulated to sink and completed grooming tasks standing at sink with supervision, mod cuing to locate needed grooming supplies. Following seated rest break, Pt left ambulating in hallway with hand off to PT. Throughout session, pt's daughter educated regarding activity progression, d/c recommendation, activity progress, dementia and disease progression, DME and d/c planning.   Session Two: Pt seen for OT session focusing on functional mobility and ADL re-training. Pt asleep in supine upon arrival with daughter present. Pt easily awaken and agreeable to tx session. Throughout session, pt complaints of soreness in R hip, RN aware and heat pad applied at end of session for comfort. She donned shoes while seated in recliner with min A, requiring VCs and  encouragement for independence with task. She ambulated into bathroom and completed toileting task with close supervision and VCs for sequencing of task. Hand hygiene completed standing at sink. She ambulated to ADL apartment, sit>stand from soft bed with supervision before returning to room with max cuing for path finding.  Pt with increased confusion this session, requiring constant encouragement and reassurance of performance. Pt returned to room at end of session, left seated in recliner with daughter and son present.   Therapy Documentation Precautions:  Precautions Precautions: Fall Restrictions Weight Bearing Restrictions: No LLE Weight Bearing: Weight bearing as tolerated   Therapy/Group: Individual Therapy  Leticia Mcdiarmid L 06/27/2018, 6:15 AM

## 2018-06-27 NOTE — Progress Notes (Signed)
Social Work Patient ID: Misty Carroll, female   DOB: [birth year], 84 y.o.   MRN: [id number] Met with pt and daughter to inform team conference goals supervision level and target discharge date 12/28. Daughter reports she is going home and the family will provide 24 hr care. Discussed follow up and equipment will work on discharge needs

## 2018-06-27 NOTE — Progress Notes (Signed)
Speech Language Pathology Daily Session Note  Patient Details  Name: Misty Carroll MRN: [id number] Date of Birth: 12-Feb-1933  Today's Date: 06/27/2018 SLP Individual Time: 0700-0745 SLP Individual Time Calculation (min): 45 min  Short Term Goals: Week 2: SLP Short Term Goal 1 (Week 2): Length of stay now longer than originally anticipated due to medical issues with decline in function, continue working towards LTG  Skilled Therapeutic Interventions:  Skilled treatment session focused on cognition goals. SLP facilitated session with pt in bed but easily aroused and agreeable to ST. SLP facilitated session by providing items for pt to perform self-care task such as washing face, brushing hair. With gentle encouragement, pt able to attempt task. Pt with better engagement this session. Pt reoriented multiple times to location, situation and purpose of therapy. Pt receptive but unable to recall. Pt left upright in bed, bed alarm on and all needs within reach. Continue per current plan of care.      Pain Pain Assessment Pain Scale: 0-10 Pain Score: 0-No pain  Therapy/Group: Individual Therapy  Misty Carroll 06/27/2018, 10:14 AM

## 2018-06-27 NOTE — Patient Care Conference (Signed)
Inpatient RehabilitationTeam Conference and Plan of Care Update Date: 06/27/2018   Time: 9:35 AM    Patient Name: Misty Carroll      Medical Record Number: [id number]  Date of Birth: 18-Apr-1933 Sex: Female         Room/Bed: 4M08C/4M08C-01 Payor Info: Payor: MEDICARE / Plan: MEDICARE PART A AND B / Product Type: *No Product type* /    Admitting Diagnosis: TBI DEMENTIA  Admit Date/Time:  06/16/2018  6:18 PM Admission Comments: No comment available   Primary Diagnosis:  <principal problem not specified> Principal Problem: <principal problem not specified>  Patient Active Problem List   Diagnosis Date Noted  . Orthostasis   . Hyponatremia   . Sleep disturbance   . Closed nondisplaced intertrochanteric fracture of left femur ([place])   . Labile blood pressure   . Hypokalemia   . Acute blood loss anemia   . Hypoalbuminemia due to protein-calorie malnutrition ([place])   . Traumatic subdural hematoma ([place]) 06/16/2018  . Fall 06/13/2018  . Uterine prolapse 01/19/2017  . Cystocele, midline 01/19/2017  . Cystocele with small rectocele and uterine descent 09/03/2016  . Uterovaginal prolapse, incomplete 09/03/2016  . Left-sided epistaxis 01/25/2013    Expected Discharge Date: Expected Discharge Date: 07/01/18  Team Members Present: Physician leading conference: Dr. Delice Lesch Social Worker Present: Ovidio Kin, LCSW Nurse Present: Dorien Chihuahua, RN PT Present: Kennyth Lose, PT OT Present: Amy Rounds, OT SLP Present: Windell Moulding, SLP PPS Coordinator present : Daiva Nakayama, RN, CRRN     Current Status/Progress Goal Weekly Team Focus  Medical   Decreased functional mobility secondary to traumatic SAH/TBI and left femoral neck fracture.   Improve mobility, cognition, nutritional intake, hyponatremia, hypokalemia  See above   Bowel/Bladder   pt continent of B/B LBM 12/22  remain continent of B/B normal bowel pattern  Assist with toileting needs prn laxatives prn   Swallow/Nutrition/  Hydration             ADL's   Supervision-CGA overall. Mod cuing for sequencing, path finding and safety  Supervision overall  Family training, ADL re-training, d/c planning   Mobility   S overall with RW, min guard stairs  S overall  Activity tolerance, dynamic balance, LE strengthening, family education   Communication   Mod A for word finding  Mod A - downgraded 12/20  expression of wants and needs to caregivers   Safety/Cognition/ Behavioral Observations  Max A  Mod to Max A - goals downgraded 12/20  basic problem solving, orientation, memory   Pain   denies any pain scheduled tylenol   pt will be free of pain  assess pain qshift and prn   Skin   surgical dressing L hip ecchymosis ro lateral left arm from wrist to shoulder and Left side of face sutures to L orbital near brow line   Remain free of infection skin remain intact with resolution of current issues  assess skin q shift and prn      *See Care Plan and progress notes for long and short-term goals.     Barriers to Discharge  Current Status/Progress Possible Resolutions Date Resolved   Physician    Medical stability;Decreased caregiver support;Lack of/limited family support     See above  Therapies, optimize sleep, follow labs, optimize BP, supplement K+      Nursing                  PT  OT                  SLP                SW                Discharge Planning/Teaching Needs:  Daughter's need to come up with a 24 hr supervision plan. Both discussing options-family, hired assist and ALF. Pt wants to go home      Team Discussion:  Progressing toward her supervision level goals-medically still getting IV's at night for hydration and MD checking potassium due to low yesterday. Repeating labs this am. Daughter here and has been checked off for taking ehr to the bathroom. Will have other daughter come in for education prior to discharge.  Revisions to Treatment Plan:  DC 12/28    Continued  Need for Acute Rehabilitation Level of Care: The patient requires daily medical management by a physician with specialized training in physical medicine and rehabilitation for the following conditions: Daily direction of a multidisciplinary physical rehabilitation program to ensure safe treatment while eliciting the highest outcome that is of practical value to the patient.: Yes Daily medical management of patient stability for increased activity during participation in an intensive rehabilitation regime.: Yes Daily analysis of laboratory values and/or radiology reports with any subsequent need for medication adjustment of medical intervention for : Neurological problems;Blood pressure problems;Other;Post surgical problems   I attest that I was present, lead the team conference, and concur with the assessment and plan of the team.   Elease Hashimoto 06/27/2018, 10:14 AM

## 2018-06-28 DIAGNOSIS — R739 Hyperglycemia, unspecified: Secondary | ICD-10-CM

## 2018-06-28 LAB — URINALYSIS, ROUTINE W REFLEX MICROSCOPIC
Bilirubin Urine: NEGATIVE
Glucose, UA: NEGATIVE mg/dL
KETONES UR: 5 mg/dL — AB
Nitrite: NEGATIVE
PROTEIN: 30 mg/dL — AB
Specific Gravity, Urine: 1.033 — ABNORMAL HIGH (ref 1.005–1.030)
pH: 5 (ref 5.0–8.0)

## 2018-06-28 MED ORDER — MAGNESIUM OXIDE 400 (241.3 MG) MG PO TABS
200.0000 mg | ORAL_TABLET | Freq: Two times a day (BID) | ORAL | Status: DC
  Administered 2018-06-28 – 2018-07-06 (×17): 200 mg via ORAL
  Filled 2018-06-28 (×17): qty 1

## 2018-06-28 MED ORDER — LISINOPRIL 5 MG PO TABS
2.5000 mg | ORAL_TABLET | Freq: Every day | ORAL | Status: DC
  Administered 2018-06-28 – 2018-07-06 (×9): 2.5 mg via ORAL
  Filled 2018-06-28 (×9): qty 1

## 2018-06-28 NOTE — Progress Notes (Signed)
[place] PHYSICAL MEDICINE & REHABILITATION PROGRESS NOTE   Subjective/Complaints: Patient seen sitting up in her chair.  Limited engagement.  Family members at bedside.   ROS: Limited due to cognition.  Objective:   No results found. Recent Labs    06/26/18 0918  WBC 4.8  HGB 12.2  HCT 37.1  PLT 255   Recent Labs    06/26/18 0918 06/27/18 0929  NA 134* 135  K 2.8* 3.1*  CL 96* 98  CO2 28 26  GLUCOSE 188* 132*  BUN 15 15  CREATININE 0.89 0.55  CALCIUM 8.9 8.9    Intake/Output Summary (Last 24 hours) at 06/28/2018 0817 Last data filed at 06/27/2018 1700 Gross per 24 hour  Intake 300 ml  Output -  Net 300 ml     Physical Exam: Vital Signs Blood pressure (!) 172/78, pulse 67, temperature 98.1 F (36.7 C), temperature source Oral, resp. rate 16, height 5\' 5"  (1.651 m), weight 54.5 kg, SpO2 96 %. Constitutional: No distress . Vital signs reviewed. HENT: Normocephalic.  Atraumatic. Eyes: EOMI. No discharge. Cardiovascular: Irregularly irregular. No JVD. Respiratory: CTA bilaterally. Normal effort. GI: BS +. Non-distended. Musc: No edema or tenderness in extremities. Neurological: Alert. Global aphasia HOH Delays in processing.   Motor: Bilateral UE 4+/5 proximal to distal, when willing to participate, stable RLE: 4/5 proximal to distal, when willing to participate, stable LLE: 4-/5 prox to distal, when willing to participate Skin: Skin iswarm. Psychiatric: Confused, tangential, and disoriented  Assessment/Plan: 1. Functional deficits secondary to TBI, left fnf  which require 3+ hours per day of interdisciplinary therapy in a comprehensive inpatient rehab setting.  Physiatrist is providing close team supervision and 24 hour management of active medical problems listed below.  Physiatrist and rehab team continue to assess barriers to discharge/monitor patient progress toward functional and medical goals  Care Tool:  Bathing    Body parts bathed by  patient: Right arm, Left arm, Chest, Abdomen, Front perineal area, Buttocks, Right upper leg, Left upper leg, Right lower leg, Left lower leg, Face         Bathing assist Assist Level: Contact Guard/Touching assist     Upper Body Dressing/Undressing Upper body dressing   What is the patient wearing?: Pull over shirt, Bra    Upper body assist Assist Level: Supervision/Verbal cueing    Lower Body Dressing/Undressing Lower body dressing      What is the patient wearing?: Underwear/pull up, Pants     Lower body assist Assist for lower body dressing: Contact Guard/Touching assist     Toileting Toileting Toileting Activity did not occur (Clothing management and hygiene only): Safety/medical concerns  Toileting assist Assist for toileting: Contact Guard/Touching assist Assistive Device Comment: BSC   Transfers Chair/bed transfer  Transfers assist  Chair/bed transfer activity did not occur: Safety/medical concerns  Chair/bed transfer assist level: Supervision/Verbal cueing     Locomotion Ambulation   Ambulation assist      Assist level: Contact Guard/Touching assist Assistive device: Walker-rolling Max distance: 100   Walk 10 feet activity   Assist     Assist level: Contact Guard/Touching assist Assistive device: Walker-rolling   Walk 50 feet activity   Assist    Assist level: Contact Guard/Touching assist Assistive device: Walker-rolling    Walk 150 feet activity   Assist Walk 150 feet activity did not occur: Safety/medical concerns  Assist level: Supervision/Verbal cueing Assistive device: Walker-rolling    Walk 10 feet on uneven surface  activity   Assist Walk  10 feet on uneven surfaces activity did not occur: Safety/medical concerns         Wheelchair     Assist Will patient use wheelchair at discharge?: No   Wheelchair activity did not occur: N/A         Wheelchair 50 feet with 2 turns activity    Assist     Wheelchair 50 feet with 2 turns activity did not occur: N/A       Wheelchair 150 feet activity     Assist Wheelchair 150 feet activity did not occur: N/A         Medical Problem List and Plan: 1.Decreased functional mobilitysecondary to traumatic SAH/TBI and left femoral neck fracture. Status post ORIF 06/13/2018.  Continue CIR  Follow-up cranial CT scan 12/18 reviewed, stable.   Weightbearing as tolerated 2. DVT Prophylaxis/Anticoagulation: SCDs. Dopplers limited, but no DVT noted 3. Pain Management:Robaxin as needed 4. Mood:Aricept 5 mg daily, Risperdal 1 mg daily 5. Neuropsych: This patientis not capable of making decisions on herown behalf. 6. Skin/Wound Care:Routine skin checks 7. Fluids/Electrolytes/Nutrition:Routine in and out's  Poor nutritional intake on 12/25 8. Seizure prophylaxis. Keppra 500 mg twice a day 9. Hypothyroidism. Synthroid 10. Constipation. Laxative assistance 11.  Labile blood pressure  Labile and elevated on 12/25  Will order orthostatics  Lisinopril 2.5 started on 12/25  Continue to monitor 12.  Hypokalemia:   Potassium 3.1 on 12/24  Supplement initiated  Labs pending  Magnesium 1.7 on 12/24 - supplement initiated  Continue to monitor 13.  Acute blood loss anemia: Resolved  Hemoglobin 12.2 on 12/23  Continue to monitor 14.  Hypoalbuminemia  Supplement initiated on 12/16 15.  Sleep disturbance  EKG on 12/11 reviewed, trazodone started on 12/17, however not given, changed to as needed  May try tramadol at night as patient used to get good relief of pain and sleep with this medication 16.  Hyponatremia  Sodium 134 on 12/23  Continue to monitor 17.  Nonresponsiveness  CT stable, EEG negative, labs relatively unremarkable  Suspect related to hydration status and orthostasis  Teds/abdominal binder as needed 18.  Hyponatremia  Sodium 134 on 12/23  Continue to monitor 19.  Hyperglycemia  Elevated on 12/24   LOS: 12  days A FACE TO FACE EVALUATION WAS PERFORMED  Ankit Lorie Phenix 06/28/2018, 8:17 AM

## 2018-06-28 NOTE — Progress Notes (Signed)
Removed staples per order. Patient tolerated well. Covered with pink foam

## 2018-06-29 ENCOUNTER — Inpatient Hospital Stay (HOSPITAL_COMMUNITY): Payer: Medicare Other | Admitting: Speech Pathology

## 2018-06-29 ENCOUNTER — Inpatient Hospital Stay (HOSPITAL_COMMUNITY): Payer: Medicare Other | Admitting: Occupational Therapy

## 2018-06-29 ENCOUNTER — Inpatient Hospital Stay (HOSPITAL_COMMUNITY): Payer: Medicare Other | Admitting: Physical Therapy

## 2018-06-29 LAB — BASIC METABOLIC PANEL
ANION GAP: 5 (ref 5–15)
Anion gap: 10 (ref 5–15)
BUN: 13 mg/dL (ref 8–23)
BUN: 21 mg/dL (ref 8–23)
CALCIUM: 9.2 mg/dL (ref 8.9–10.3)
CO2: 25 mmol/L (ref 22–32)
CO2: 30 mmol/L (ref 22–32)
Calcium: 9.2 mg/dL (ref 8.9–10.3)
Chloride: 98 mmol/L (ref 98–111)
Chloride: 98 mmol/L (ref 98–111)
Creatinine, Ser: 0.77 mg/dL (ref 0.44–1.00)
Creatinine, Ser: 0.8 mg/dL (ref 0.44–1.00)
GFR calc Af Amer: >60 mL/min (ref >60)
GFR calc Af Amer: >60 mL/min (ref >60)
GFR calc non Af Amer: >60 mL/min (ref >60)
GFR calc non Af Amer: >60 mL/min (ref >60)
Glucose, Bld: 156 mg/dL — ABNORMAL HIGH (ref 70–99)
Glucose, Bld: 118 mg/dL — ABNORMAL HIGH (ref 70–99)
Potassium: 4 mmol/L (ref 3.5–5.1)
Potassium: 4.1 mmol/L (ref 3.5–5.1)
Sodium: 133 mmol/L — ABNORMAL LOW (ref 135–145)
Sodium: 133 mmol/L — ABNORMAL LOW (ref 135–145)

## 2018-06-29 MED ORDER — DONEPEZIL HCL 5 MG PO TABS: 5.0000 mg | ORAL_TABLET | Freq: Every day | ORAL | 0 refills | Status: DC

## 2018-06-29 MED ORDER — SIMVASTATIN 20 MG PO TABS: 20.0000 mg | ORAL_TABLET | Freq: Every day | ORAL | 0 refills | Status: DC

## 2018-06-29 MED ORDER — MAGNESIUM OXIDE 400 (241.3 MG) MG PO TABS: 200.0000 mg | ORAL_TABLET | Freq: Two times a day (BID) | ORAL | 1 refills | Status: DC

## 2018-06-29 MED ORDER — LISINOPRIL 2.5 MG PO TABS: 2.5000 mg | ORAL_TABLET | Freq: Every day | ORAL | 1 refills | Status: DC

## 2018-06-29 MED ORDER — ACETAMINOPHEN 325 MG PO TABS: 650.0000 mg | ORAL_TABLET | Freq: Four times a day (QID) | ORAL | Status: DC

## 2018-06-29 MED ORDER — LEVOTHYROXINE SODIUM 75 MCG PO TABS: 75.0000 ug | ORAL_TABLET | Freq: Every day | ORAL | 1 refills | Status: DC

## 2018-06-29 MED ORDER — LEVETIRACETAM 500 MG PO TABS: 500.0000 mg | ORAL_TABLET | Freq: Two times a day (BID) | ORAL | 1 refills | Status: DC

## 2018-06-29 MED ORDER — RISPERIDONE 0.5 MG PO TABS: 0.5000 mg | ORAL_TABLET | Freq: Every day | ORAL | 0 refills | Status: DC

## 2018-06-29 NOTE — Progress Notes (Signed)
Called on call, informed of concern daughter reported of increased confusion and lethargy. VS WNL, new order for BMET.

## 2018-06-29 NOTE — Discharge Summary (Signed)
Discharge summary job # [id number]

## 2018-06-29 NOTE — Progress Notes (Signed)
Speech Language Pathology Daily Session Note  Patient Details  Name: Misty Carroll MRN: [id number] Date of Birth: 07/27/1932  Today's Date: 06/29/2018 SLP Individual Time: 8466-5993 SLP Individual Time Calculation (min): 60 min  Short Term Goals: Week 2: SLP Short Term Goal 1 (Week 2): Length of stay now longer than originally anticipated due to medical issues with decline in function, continue working towards LTG  Skilled Therapeutic Interventions:  Pt was seen for skilled ST targeting cognitive goals.  Pt was seated in recliner in upon therapist's arrival with daughter at bedside.  Daughter verified plan for discharge home on 12/28 with 24/7 supervision from family.  Pt was awake, alert, and agreeable to participate in therapy.  Speech is still garbled and difficult to understand due to intelligibility and vague, circumolocutory output.  SLP facilitated the session with a basic calendar task to address attention to task and basic problem solving, as well as to reinforce basic orientation information.  Pt was able to place number cards in numerical order into a large calendar with max faded to min assist verbal cues.  She sustained her attention to task for ~5 minute intervals with min cues for redirection.  Throughout task, therapist reinforced today's date as well as her impending discharge date.  Pt was able to calculate that she had 2 days left until she went home with max assist.  SLP also facilitated the session with a grocery shopping task to address similar goals.  Pt needed max cues to locate 5 targeted items from a grocery store flyer and read their corresponding prices.  Pt was returned to room and left in recliner with call bell within reach.  Family at bedside.  SLP discussed safety strategies for in the home environment and provided skilled education regarding techniques to maximize orientation.   Continue per current plan of care.    Pain  no/denies pain   Therapy/Group: Individual  Therapy  Kamani Lewter, Selinda Orion 06/29/2018, 10:12 PM

## 2018-06-29 NOTE — Progress Notes (Signed)
[facility] PHYSICAL MEDICINE & REHABILITATION PROGRESS NOTE   Subjective/Complaints: Patient seen laying in bed this morning.  She slept well overnight per daughter.  No reported issues.   ROS: Limited due to cognition.  Objective:   No results found. Recent Labs    06/26/18 0918  WBC 4.8  HGB 12.2  HCT 37.1  PLT 255   Recent Labs    06/26/18 0918 06/27/18 0929  NA 134* 135  K 2.8* 3.1*  CL 96* 98  CO2 28 26  GLUCOSE 188* 132*  BUN 15 15  CREATININE 0.89 0.55  CALCIUM 8.9 8.9    Intake/Output Summary (Last 24 hours) at 06/29/2018 0901 Last data filed at 06/28/2018 1820 Gross per 24 hour  Intake 444 ml  Output -  Net 444 ml     Physical Exam: Vital Signs Blood pressure (!) 151/74, pulse 67, temperature 98.3 F (36.8 C), temperature source Oral, resp. rate 17, height 5\' 5"  (1.651 m), weight 54.5 kg, SpO2 98 %. Constitutional: No distress . Vital signs reviewed. HENT: Normocephalic.  Atraumatic. Eyes: EOMI. No discharge. Cardiovascular: Irregularly irregular.  No JVD. Respiratory: CTA bilaterally.  Normal effort. GI: BS +. Non-distended. Musc: No edema or tenderness in extremities. Neurological: Somnolent. Global aphasia HOH Delays in processing.   Motor: Bilateral UE 4+/5 proximal to distal, when willing to participate, unchanged RLE: 4/5 proximal to distal, when willing to participate, unchanged LLE: 4-/5 prox to distal, when willing to participate, unchanged Skin: Skin iswarm. Psychiatric: Confused, tangential, and disoriented  Assessment/Plan: 1. Functional deficits secondary to TBI, left fnf  which require 3+ hours per day of interdisciplinary therapy in a comprehensive inpatient rehab setting.  Physiatrist is providing close team supervision and 24 hour management of active medical problems listed below.  Physiatrist and rehab team continue to assess barriers to discharge/monitor patient progress toward functional and medical goals  Care  Tool:  Bathing    Body parts bathed by patient: Right arm, Left arm, Chest, Abdomen, Front perineal area, Buttocks, Right upper leg, Left upper leg, Right lower leg, Left lower leg, Face         Bathing assist Assist Level: Contact Guard/Touching assist     Upper Body Dressing/Undressing Upper body dressing   What is the patient wearing?: Pull over shirt, Bra    Upper body assist Assist Level: Supervision/Verbal cueing    Lower Body Dressing/Undressing Lower body dressing      What is the patient wearing?: Underwear/pull up, Pants     Lower body assist Assist for lower body dressing: Contact Guard/Touching assist     Toileting Toileting Toileting Activity did not occur (Clothing management and hygiene only): Safety/medical concerns  Toileting assist Assist for toileting: Contact Guard/Touching assist Assistive Device Comment: BSC   Transfers Chair/bed transfer  Transfers assist  Chair/bed transfer activity did not occur: Safety/medical concerns  Chair/bed transfer assist level: Supervision/Verbal cueing     Locomotion Ambulation   Ambulation assist      Assist level: Contact Guard/Touching assist Assistive device: Walker-rolling Max distance: 100   Walk 10 feet activity   Assist     Assist level: Contact Guard/Touching assist Assistive device: Walker-rolling   Walk 50 feet activity   Assist    Assist level: Contact Guard/Touching assist Assistive device: Walker-rolling    Walk 150 feet activity   Assist Walk 150 feet activity did not occur: Safety/medical concerns  Assist level: Supervision/Verbal cueing Assistive device: Walker-rolling    Walk 10 feet on uneven surface  activity   Assist Walk 10 feet on uneven surfaces activity did not occur: Safety/medical concerns         Wheelchair     Assist Will patient use wheelchair at discharge?: No   Wheelchair activity did not occur: N/A         Wheelchair 50 feet with  2 turns activity    Assist    Wheelchair 50 feet with 2 turns activity did not occur: N/A       Wheelchair 150 feet activity     Assist Wheelchair 150 feet activity did not occur: N/A         Medical Problem List and Plan: 1.Decreased functional mobilitysecondary to traumatic SAH/TBI and left femoral neck fracture. Status post ORIF 06/13/2018.  Continue CIR  Follow-up cranial CT scan 12/18 reviewed, stable.   Weightbearing as tolerated 2. DVT Prophylaxis/Anticoagulation: SCDs. Dopplers limited, but no DVT noted 3. Pain Management:Robaxin as needed 4. Mood:Aricept 5 mg daily, Risperdal 1 mg daily 5. Neuropsych: This patientis not capable of making decisions on herown behalf. 6. Skin/Wound Care:Routine skin checks 7. Fluids/Electrolytes/Nutrition:Routine in and out's  Poor nutritional intake on 12/25 8. Seizure prophylaxis. Keppra 500 mg twice a day 9. Hypothyroidism. Synthroid 10. Constipation. Laxative assistance 11.  Labile blood pressure  Orthostatics pending  Lisinopril 2.5 started on 12/25  Labile on 12/26  Continue to monitor 12.  Hypokalemia:   Potassium 3.1 on 12/24  Supplement initiated  Labs pending  Magnesium 1.7 on 12/24 - supplement initiated  Continue to monitor 13.  Acute blood loss anemia: Resolved  Hemoglobin 12.2 on 12/23  Continue to monitor 14.  Hypoalbuminemia  Supplement initiated on 12/16 15.  Sleep disturbance  EKG on 12/11 reviewed, trazodone started on 12/17, however not given, changed to as needed  May try tramadol at night as patient used to get good relief of pain and sleep with this medication 16.  Hyponatremia  Sodium 134 on 12/23  Labs pending  Continue to monitor 17.  Nonresponsiveness: Resolved  CT stable, EEG negative, labs relatively unremarkable  Suspect related to hydration status and orthostasis  Teds/abdominal binder as needed 18.  Hyperglycemia  Elevated on 12/24   LOS: 13 days A FACE TO FACE  EVALUATION WAS PERFORMED  Ankit Lorie Phenix 06/29/2018, 9:01 AM

## 2018-06-29 NOTE — Progress Notes (Signed)
Occupational Therapy Session Note  Patient Details  Name: Misty Carroll MRN: [id number] Date of Birth: [birth year]  Today's Date: 06/29/2018 OT Individual Time: 1583-0940 OT Individual Time Calculation (min): 75 min    Short Term Goals: Week 2:  OT Short Term Goal 1 (Week 2): Cont to work towards LTG of supervision overall  Skilled Therapeutic Interventions/Progress Updates:    Pt seen for OT session focusing on ADL re-training and education with pt's daughter. Pt sitting up in recliner upon arrival, plesantly confused and agreeable to tx session. Unable to determine orientation level 2/2 dysarthia and overall confusion. PT also unable to answer name and DOB questions asked by lab tech during session.  She ambulated throughout session using RW with supervision, max cuing for path finding and attention to task. She dressed seated in chair, overall supervision with VCs for sequencingand activity progression. She required min A to slide shoes on fully and to fasten.  She ambulated to ADL apartment, noted short stride length noted with close BOS. Educated pt and caregiver regarding high fall risk associsated with this step form. Provided multi-modal including visual and verbal cuing to facilitate longer and wider stride length. Immediate improvements noted, however, difficulty caring over when cuing removed.  Seated rest breaks during mobility provided on low soft surface couch with supervision.  Attempted to have pt make bed in apartment as this is something she did PTA. Required mod cuing and encouragement for task purpose and participation. Ultimately able to complete with close supervision ambulating without AD.  Pt returned to room at end of session, left seated in recliner with all needs in reach and daughter present. Education provided throughout session regarding continuum of care, activity progression,importance of participation with ADLs, therapy goals,and d/c planning.   Therapy  Documentation Precautions:  Precautions Precautions: Fall Restrictions Weight Bearing Restrictions: Yes LLE Weight Bearing: Weight bearing as tolerated   Therapy/Group: Individual Therapy  Fancy Dunkley L 06/29/2018, 7:04 AM

## 2018-06-29 NOTE — Progress Notes (Signed)
Physical Therapy Session Note  Patient Details  Name: Misty Carroll MRN: [id number] Date of Birth: 10-05-1932  Today's Date: 06/29/2018 PT Individual Time: 1130-1210 PT Individual Time Calculation (min): 40 min   Short Term Goals: Week 2:  PT Short Term Goal 1 (Week 2): =LTG due to estimated LOS  Skilled Therapeutic Interventions/Progress Updates: Pt received seated in recliner asleep, per daughter pt fell asleep immediately after previous OT session; daughter requests to allow pt to sleep a little before participating. Therapist returned 30 min later and pt awake and agreeable to treatment. Pt ambulated in room and throughout unit with RW and S; slow gait speed and unable to integrate cueing for larger step length as done in previous OT session. Performed bed mobility with min guard, slow initiation and declines cues for performing independently. Standing toe taps to 3" step with HHA x6 reps. Gait to return to room with RW and S. Remained seated in recliner at end of session, all needs in reach.      Therapy Documentation Precautions:  Precautions Precautions: Fall Restrictions Weight Bearing Restrictions: Yes LLE Weight Bearing: Weight bearing as tolerated General: PT Amount of Missed Time (min): 20 Minutes PT Missed Treatment Reason: Patient fatigue    Therapy/Group: Individual Therapy  Corliss Skains 06/29/2018, 12:10 PM

## 2018-06-30 ENCOUNTER — Inpatient Hospital Stay (HOSPITAL_COMMUNITY): Payer: Medicare Other | Admitting: Occupational Therapy

## 2018-06-30 ENCOUNTER — Inpatient Hospital Stay (HOSPITAL_COMMUNITY): Payer: Medicare Other | Admitting: Physical Therapy

## 2018-06-30 ENCOUNTER — Inpatient Hospital Stay (HOSPITAL_COMMUNITY): Payer: Medicare Other | Admitting: Speech Pathology

## 2018-06-30 DIAGNOSIS — R8290 Unspecified abnormal findings in urine: Secondary | ICD-10-CM

## 2018-06-30 DIAGNOSIS — F02818 Dementia in other diseases classified elsewhere, unspecified severity, with other behavioral disturbance: Secondary | ICD-10-CM

## 2018-06-30 DIAGNOSIS — G301 Alzheimer's disease with late onset: Secondary | ICD-10-CM

## 2018-06-30 DIAGNOSIS — F0281 Dementia in other diseases classified elsewhere with behavioral disturbance: Secondary | ICD-10-CM

## 2018-06-30 MED ORDER — SODIUM CHLORIDE 0.9 % IV SOLN
1.0000 g | INTRAVENOUS | Status: AC
  Administered 2018-06-30 – 2018-07-04 (×5): 1 g via INTRAVENOUS
  Filled 2018-06-30 (×5): qty 10

## 2018-06-30 NOTE — Progress Notes (Signed)
Physical Therapy Session Note  Patient Details  Name: Misty Carroll MRN: [id number] Date of Birth: [birth year]  Today's Date: 06/30/2018 PT Individual Time: 4098-1191   45 min   Short Term Goals: Week 1:  PT Short Term Goal 1 (Week 1): =LTGs due to ELOS Week 2:  PT Short Term Goal 1 (Week 2): =LTG due to estimated LOS     Skilled Therapeutic Interventions/Progress Updates:   Pt received sitting in WC and agreeable to PT. PT instructed pt in gait training through hall with RW and CGA for AD management 161f x 2, 1742fand 20077fMin cues for sequencing in turns and awareness of obstacles in the floor. Sit<>stand completed with CGA from various surfaces throughout treatment including nustep, sofa, recliner, and arm chair. Min cues for set up from PT and improved safety with use of RW.   Nustep reciprocal movement and sustained attention task x 6 minutes + 2 minutes. Min cues for improved speed and ROM throughout as well retention of target finish time.   Standing balance and task initiation to engage in horse shoes 2 bouts x 6 tosses with supervision assist from PT for safety.   [facility] returned to room and [facility] and all needs met.          Therapy Documentation Precautions:  Precautions Precautions: Fall Restrictions Weight Bearing Restrictions: Yes LLE Weight Bearing: Weight bearing as tolerated Vital Signs: Therapy Vitals Temp: 97.7 F (36.5 C) Temp Source: Oral Pulse Rate: 70 Resp: 16 BP: (!) 120/53 Patient Position (if appropriate): Lying Oxygen Therapy SpO2: 100 % O2 Device: Room Air Pain: 0/10    Therapy/Group: Individual Therapy  AusLorie Phenix/27/2019, 3:55 PM

## 2018-06-30 NOTE — Progress Notes (Signed)
Occupational Therapy Session Note  Patient Details  Name: Misty Carroll MRN: [id number] Date of Birth: [birth year]  Today's Date: 06/30/2018 OT Individual Time: 0940-1040 and 1245-1325 OT Individual Time Calculation (min): 60 min and 40 min   and Today's Date: 06/30/2018 OT Missed Time: 15 Minutes Missed Time Reason: Patient fatigue;Other (comment)(15)   Short Term Goals: Week 2:  OT Short Term Goal 1 (Week 2): Cont to work towards LTG of supervision overall  Skilled Therapeutic Interventions/Progress Updates:    Session One: Pt seen for OT session focusing on functional ambulation and ADL re-training. Pt sitting up in recliner upon arrival with son present. Son exiting room prior to intervention. Pt unable to answer basic orientation questions including her name this morning. Throughout session, pt unintelligible with all speech. She appeared more confused this session compared to previous, RN made aware. Pt difficult to direct during ADL tasks. She ambulated with close supervision-CGA with RW. She required VCs to correctly line up with toilet prior to sitting. VCs required for sequencing of toileting task, pt retrieving toilet paper following void, however, never attempted to complete hygiene. Attempted to have pt get into shower for bathing task, however, due to increased confusion and difficulty following commands to doff clothing, therapist opted not to place pt in shower as pt becoming increasingly frustrated with therapist during attempt. Therefore exited bathroom and returned to standard chair. She dressed seated in chair, able to correctly sequence and complete task with set-up and supervision. PT unable to sequence or follow VCs to doff dirty underwear prior to donning clean pair, however, was able to sequence and complete all aspects of UB dressing with set-up and distant supervision.  She completed grooming tasks standing at sink with distant supervision.  With max cuing, pt able to  path find back into bathroom to attempt another void for UA sample. Pt requiring max cuing for sequencing, ultimately forgetting to pull down underwear before sitting. Pt returned to recliner. Chair belt alarm donned and soft music left playing in background. Pt already alsep prior to OT exiting room.  Missed 15 minutes of tx time secondary to confusion and fatigue. RN made aware of events of session and pt's change in status.   Session Two: Pt seen for OT session focusing on functional mobility and activity tolreance. Pt sitting up in recliner upon arrival with all 3 of her children present. RN requested therapist try to have pt void as still awaiting UA sample. Therapist attempting to cue pt into bathroom and sequence through toileting task, however, pt becoming frustrated with therapist, asking therapist to leave her alone in bathroom prior to completing clothing management, etc. Pt's daughter was able to calm and assist pt and able to successfully complete toileting tasks and hand hygiene. Pt agreeable to ambulating off unit with children in order to get lunch. Completed functional ambulation throughout hospital with overall close supervision with occasional assist to manage RW over environmental obstacles.  Pt cont with increased confusion this session, however, was able to be redirected by daughter during session.  Pt left seated in off unit dining area with children (grounds pass in system). Plan for SLP to meet pt and family downstairs for next session. Therapist returned to unit and RN made aware. SLP made aware.   Therapy Documentation Precautions:  Precautions Precautions: Fall Restrictions Weight Bearing Restrictions: Yes LLE Weight Bearing: Weight bearing as tolerated Pain:   No s/s of distress, unable to verbalize   Therapy/Group: Individual Therapy  Lylliana Kitamura  L 06/30/2018, 6:49 AM

## 2018-06-30 NOTE — Discharge Summary (Addendum)
NAME: Misty Carroll, Misty Carroll MEDICAL RECORD TO:[id number] ACCOUNT [id number] DATE OF BIRTH:03-16-1933 FACILITY: [facility] LOCATION: [facility] PHYSICIAN:Offie Pickron, MD  DISCHARGE SUMMARY  DATE OF DISCHARGE:  07/06/2018  DISCHARGE DIAGNOSES: 1.  Traumatic subarachnoid hemorrhage with traumatic brain injury as well as left femoral neck fracture status post open reduction internal fixation on 06/13/2018. 2.  Sequential compression devices for deep venous thrombosis prophylaxis. 3.  Pain management. 4.  Mood with dementia. 5.  Seizure prophylaxis. 6.  Hypothyroidism. 7.  Constipation. 8.  Labile blood pressure. 9.  Acute blood loss anemia 10.Urine culture multiple species  HOSPITAL COURSE:  This is an 84 year old right-handed female with history of hypertension, memory loss.  Maintained on Aricept as well as Risperdal.  Presented 06/13/2018.  She lives alone, independent prior to admission.  She does have family in the  area that check on her routinely.  Presented after reported fall that was unwitnessed.  The patient contacted her daughter, who arrived at the house and patient came to the door to let her in.  Cranial CT scan showed small left frontal subdural  hemorrhage as well as trace subarachnoid hemorrhage.  CT cervical spine negative.  Troponin negative.  X-rays and imaging of the left hip showed nondisplaced fracture of left femoral neck with mild impaction, no dislocation.  Neurosurgery followup with  Dr. Annette Stable.   Advised conservative care of SAH.  Followup cranial CT scan 06/14/2018 showed some mild interval increase in subarachnoid blood within the left Sylvian fissure.  Interval extension to the left parietal occipital lobes.  No midline shift.   Maintained on Keppra for seizure prophylaxis.  Underwent ORIF, left hip, 06/13/2018 per Dr. Stann Mainland.  Weightbearing as tolerated.  HOSPITAL COURSE:  Pain management.  The patient was admitted for a comprehensive rehab program.  PAST MEDICAL HISTORY:   See discharge diagnoses.  SOCIAL HISTORY:  Lives alone, was independent with followup by her family.  FUNCTIONAL STATUS:  Upon admission to rehab services, +2 for sit to stand minimal assist, minimal guard supine to sit, mod/max assist with ADLs.  PHYSICAL EXAMINATION: VITAL SIGNS:  Blood pressure 160/87, pulse 73, temperature 98, respirations 16. GENERAL:  Alert female, pleasantly confused, follows commands. HEENT:  EOMs intact. NECK:  Supple, nontender, no JVD. CARDIOVASCULAR:  Rate controlled. ABDOMEN:  Soft, nontender, good bowel sounds. LUNGS:  Clear to auscultation without wheeze. EXTREMITIES:  Hip incision clean and dry.  Muscle graded strength 4/5 bilateral upper extremities, 4/5 right lower, 3+/5 left lower, proximal 4/5.  REHABILITATION HOSPITAL COURSE:  The patient was admitted to inpatient rehab services.  Therapies initiated on a 3-hour daily basis, consisting of physical therapy, occupational therapy, speech therapy and rehab nursing.  The following issues were  addressed during patient's rehab stay:  Pertaining to the patient's [place] TBI, conservative care, neurosurgery, Dr. Annette Stable, left femoral neck fracture with ORIF, weightbearing as tolerated.  Her vascular sensation is intact.  She would follow up with Dr.  Stann Mainland.  SCDs for DVT prophylaxis.  Vascular studies negative.  Pain management with the use of only Robaxin as needed.  Noted the patient remained on Aricept as well as Risperdal as prior to admission.  She had some mild labile blood pressure.  She did  require IV fluids x2 days.  She continued on low-dose lisinopril 2.5 mg daily.  A followup CT scan of her head was completed 06/21/2018.  It showed no significant change in left temporal intraparenchymal hematoma.  No significant mass effect, no new  bleeding as compared to  final tracing. EEG negative for seizure. She continued on hormone supplement for hypothyroidism.  Acute blood loss anemia, stable at 12.2.  Mild hypokalemia  with supplement added. A urine culture completed showing multiply cc patient remained afebrile she completed a course of empiric Rocephin.  The patient received weekly collaborative interdisciplinary team  conferences.  The following issues were addressed:  She ambulates in her room and throughout the unit with rolling walker supervision with a slow steady pace.  Needed some cues for her safety.  Performed bed mobility, minimal guard, slow initiation.   Working with energy conservation techniques.  Standing toe taps, handheld assistance.  Activities of daily living and homemaking.  She ambulates throughout sessions using rolling walker with supervision, max cues for path finding.  She dresses seated in  a chair.  Overall supervision with cues, minimal assist to slide her shoes on fully and to fasten.  Speech therapy.  Ongoing followup for history of memory loss.  Needed some gentle encouragement at times to participate.  She was engaging better with her  sessions as time went on.  Needed reorientation multiple times for location.  Family teaching was completed and planned discharge to home.  DISCHARGE MEDICATIONS:  Included Aricept 5 mg p.o. daily, Keppra 500 mg p.o. b.i.d., Synthroid 75 mcg p.o. daily, lisinopril 2.5 mg p.o. daily, magnesium oxide 200 mg p.o. b.i.d.,Zocor 20 mg daily, Risperdal 0.5 mg p.o. daily.  Robaxin had been discontinued.  She was only  using Tylenol for pain.  DIET:  Regular.  SPECIAL INSTRUCTIONS:  Weightbearing as tolerated.  Supervision for safety.  FOLLOWUP:  The patient would follow up with Dr. Delice Lesch at the outpatient rehab service office as advised;  Dr. Earnie Larsson, call for appointment; Dr. Victorino December in 2 weeks, call for appointment; Dr. Lorene Dy, medical management.  LN/NUANCE D:06/29/2018 T:06/30/2018 JOB:[id number]  Patient seen and examined by me on day of discharge. Delice Lesch, MD, ABPMR

## 2018-06-30 NOTE — Progress Notes (Signed)
Social Work Patient ID: Misty Carroll, female   DOB: 08-15-1932, 84 y.o.   MRN: [id number] [place] with three children today at 2;30 feel the plan will change to NHP. Have taken pt off the discharge board for tomorrow.

## 2018-06-30 NOTE — Progress Notes (Signed)
[facility] PHYSICAL MEDICINE & REHABILITATION PROGRESS NOTE   Subjective/Complaints: Patient seen laying in bed this morning.  No family at bedside.  No reported issues overnight.  Discussed with nursing,?  Family prefers SNF now.  ROS: Limited due to cognition.  Objective:   No results found. No results for input(s): WBC, HGB, HCT, PLT in the last 72 hours. Recent Labs    06/29/18 0945 06/29/18 1851  NA 133* 133*  K 4.0 4.1  CL 98 98  CO2 25 30  GLUCOSE 156* 118*  BUN 13 21  CREATININE 0.77 0.80  CALCIUM 9.2 9.2    Intake/Output Summary (Last 24 hours) at 06/30/2018 0859 Last data filed at 06/29/2018 1851 Gross per 24 hour  Intake 400 ml  Output -  Net 400 ml     Physical Exam: Vital Signs Blood pressure 138/68, pulse 68, temperature (!) 97.5 F (36.4 C), temperature source Oral, resp. rate 20, height 5\' 5"  (1.651 m), weight 54.5 kg, SpO2 99 %. Constitutional: No distress . Vital signs reviewed. HENT: Normocephalic.  Atraumatic. Eyes: EOMI. No discharge. Cardiovascular: Irregularly irregular.  No JVD. Respiratory: CTA bilaterally.  Normal effort. GI: BS +. Non-distended. Musc: No edema or tenderness in extremities. Neurological: Alert. Global aphasia HOH Delays in processing.   Motor: Bilateral UE 4+/5 proximal to distal, when willing to participate, unchanged RLE: 4/5 proximal to distal, when willing to participate, unchanged LLE: 4-/5 prox to distal, when willing to participate, unchanged Skin: Skin iswarm. Psychiatric: Confused, tangential, and disoriented  Assessment/Plan: 1. Functional deficits secondary to TBI, left fnf  which require 3+ hours per day of interdisciplinary therapy in a comprehensive inpatient rehab setting.  Physiatrist is providing close team supervision and 24 hour management of active medical problems listed below.  Physiatrist and rehab team continue to assess barriers to discharge/monitor patient progress toward functional and  medical goals  Care Tool:  Bathing    Body parts bathed by patient: Right arm, Left arm, Chest, Abdomen, Front perineal area, Buttocks, Right upper leg, Left upper leg, Right lower leg, Left lower leg, Face         Bathing assist Assist Level: Supervision/Verbal cueing     Upper Body Dressing/Undressing Upper body dressing   What is the patient wearing?: Pull over shirt, Bra    Upper body assist Assist Level: Supervision/Verbal cueing    Lower Body Dressing/Undressing Lower body dressing      What is the patient wearing?: Underwear/pull up, Pants     Lower body assist Assist for lower body dressing: Supervision/Verbal cueing     Toileting Toileting Toileting Activity did not occur (Clothing management and hygiene only): Safety/medical concerns  Toileting assist Assist for toileting: Supervision/Verbal cueing Assistive Device Comment: BSC   Transfers Chair/bed transfer  Transfers assist  Chair/bed transfer activity did not occur: Safety/medical concerns  Chair/bed transfer assist level: Supervision/Verbal cueing     Locomotion Ambulation   Ambulation assist      Assist level: Supervision/Verbal cueing Assistive device: Walker-rolling Max distance: 150   Walk 10 feet activity   Assist     Assist level: Supervision/Verbal cueing Assistive device: Walker-rolling   Walk 50 feet activity   Assist    Assist level: Supervision/Verbal cueing Assistive device: Walker-rolling    Walk 150 feet activity   Assist Walk 150 feet activity did not occur: Safety/medical concerns  Assist level: Supervision/Verbal cueing Assistive device: Walker-rolling    Walk 10 feet on uneven surface  activity   Assist Walk 10  feet on uneven surfaces activity did not occur: Safety/medical concerns   Assist level: Supervision/Verbal cueing     Wheelchair     Assist Will patient use wheelchair at discharge?: No   Wheelchair activity did not occur:  N/A         Wheelchair 50 feet with 2 turns activity    Assist    Wheelchair 50 feet with 2 turns activity did not occur: N/A       Wheelchair 150 feet activity     Assist Wheelchair 150 feet activity did not occur: N/A         Medical Problem List and Plan: 1.Decreased functional mobilitysecondary to traumatic SAH/TBI and left femoral neck fracture. Status post ORIF 06/13/2018.  Continue CIR  Plan was for DC tomorrow, however patient's family appears to prefer SNF now  Follow-up cranial CT scan 12/18 reviewed, stable.   Weightbearing as tolerated 2. DVT Prophylaxis/Anticoagulation: SCDs. Dopplers limited, but no DVT noted 3. Pain Management:Robaxin as needed 4. Mood:Aricept 5 mg daily, Risperdal 1 mg daily  Continue tele-sitter 5. Neuropsych: This patientis not capable of making decisions on herown behalf. 6. Skin/Wound Care:Routine skin checks 7. Fluids/Electrolytes/Nutrition:Routine in and out's  Poor nutritional intake on 12/27 8. Seizure prophylaxis. Keppra 500 mg twice a day 9. Hypothyroidism. Synthroid 10. Constipation. Laxative assistance 11.  Labile blood pressure  Orthostatics pending  Lisinopril 2.5 started on 12/25  Labile on 12/27  Continue to monitor 12.  Hypokalemia:   Potassium 4.1 on 12/26  Supplement initiated  Magnesium 1.7 on 12/24 - supplement initiated  Labs ordered for Monday  Continue to monitor 13.  Acute blood loss anemia: Resolved  Hemoglobin 12.2 on 12/23  Continue to monitor 14.  Hypoalbuminemia  Supplement initiated on 12/16 15.  Sleep disturbance  EKG on 12/11 reviewed, trazodone started on 12/17, however not given, changed to as needed  May try tramadol at night as patient used to get good relief of pain and sleep with this medication 16.  Hyponatremia  Sodium 133 on 12/26  Labs ordered for Monday  Continue to monitor 17.  Nonresponsiveness: Resolved  CT stable, EEG negative, labs relatively  unremarkable  Suspect related to hydration status and orthostasis  Teds/abdominal binder as needed 18.  Hyperglycemia  Labile on 12/27 19.  Abnormal urinalysis  Urine culture ordered  Empiric Macrobid started   LOS: 14 days A FACE TO FACE EVALUATION WAS PERFORMED  Misty Carroll Lorie Phenix 06/30/2018, 8:59 AM

## 2018-06-30 NOTE — Progress Notes (Signed)
Pt takes medications whole; medications chewed up this am.

## 2018-06-30 NOTE — Progress Notes (Signed)
Speech Language Pathology Daily Session Note  Patient Details  Name: PRICELLA GAUGH MRN: [id number] Date of Birth: 04/04/1933  Today's Date: 06/30/2018 SLP Individual Time: 79-1430 SLP Individual Time Calculation (min): 25 min  Short Term Goals: Week 2: SLP Short Term Goal 1 (Week 2): Length of stay now longer than originally anticipated due to medical issues with decline in function, continue working towards LTG  Skilled Therapeutic Interventions:  Pt was seen for skilled ST targeting cognitive goals.  Pt was received in atrium with family members.  Per report from family members, CSW, and primary OT, pt has increased confusion today and will likely not be discharging tomorrow as originally expected.  Daughters took pt to [employer] in atrium in order to encourage her to eat more and hopefully "perk her up." Pt was returned back to room where she requested to use the bathroom when asked and was continent of bladder.  Mod-max cues were needed for navigation around her room.  Once returned to recliner, pt needed total assist to recall what she had just eaten for lunch.  She also needed total assist to reorient to place, date, and situation.  Pt was left in recliner with chair alarm set and all needs within reach.  Continue per current plan of care.    Pain Pain Assessment Pain Scale: 0-10 Pain Score: 0-No pain  Therapy/Group: Individual Therapy  Jermany Rimel, Selinda Orion 06/30/2018, 3:56 PM

## 2018-06-30 NOTE — Progress Notes (Signed)
Culture sent. Pt in therapy off floor at this time

## 2018-06-30 NOTE — Progress Notes (Signed)
Social Work Patient ID: Misty Carroll, female   DOB: [birth year], 84 y.o.   MRN: [id number] Met with pt's three children to discuss their concerns and do not feel ready to take pt home and provide the care she will need. They also feel she is not medically stable to go home. They want to pursue short term NHP. Discussed this is a short term solution and she will need memory care ALF once ready for discharge from Nursing facility. Gave them the list of NH's will begin NH process.

## 2018-06-30 NOTE — NC FL2 (Signed)
[place] LEVEL OF CARE SCREENING TOOL     IDENTIFICATION  Patient Name: Misty Carroll Birthdate: 01-16-1933 Sex: female Admission Date (Current Location): 06/16/2018  [facility] and [place] Number:  Herbalist and Address:  [facility], [place] [address], [place], [place] [address]      Provider Number: [id number]  Attending Physician Name and Address:  Jamse Arn, MD  Relative Name and Phone Number:  [name] Smith-daughter [phone]    Current Level of Care: Other (Comment)(rehab) Recommended Level of Care: [place] Prior Approval Number:    Date Approved/Denied:   PASRR Number: [id number]  Discharge Plan: SNF    Current Diagnoses: Patient Active Problem List   Diagnosis Date Noted  . Alzheimer's type dementia with late onset with behavioral disturbance ([place])   . Abnormal urinalysis   . Hyperglycemia   . Orthostasis   . Hyponatremia   . Sleep disturbance   . Closed nondisplaced intertrochanteric fracture of left femur ([place])   . Labile blood pressure   . Hypokalemia   . Acute blood loss anemia   . Hypoalbuminemia due to protein-calorie malnutrition ([place])   . Traumatic subdural hematoma ([place]) 06/16/2018  . Fall 06/13/2018  . Uterine prolapse 01/19/2017  . Cystocele, midline 01/19/2017  . Cystocele with small rectocele and uterine descent 09/03/2016  . Uterovaginal prolapse, incomplete 09/03/2016  . Left-sided epistaxis 01/25/2013    Orientation RESPIRATION BLADDER Height & Weight     Self, Place  Normal Continent Weight: 120 lb 2.4 oz (54.5 kg) Height:  5\' 5"  (165.1 cm)  BEHAVIORAL SYMPTOMS/MOOD NEUROLOGICAL BOWEL NUTRITION STATUS      Continent Diet(regular diet thin liquids)  AMBULATORY STATUS COMMUNICATION OF NEEDS Skin   Limited Assist Verbally Surgical wounds(hip sutures)                       Personal Care Assistance Level of Assistance  Bathing, Feeding, Dressing  Bathing Assistance: Limited assistance Feeding assistance: Limited assistance Dressing Assistance: Limited assistance     Functional Limitations Info  Speech, Sight Sight Info: Impaired   Speech Info: Impaired    SPECIAL CARE FACTORS FREQUENCY  PT (By licensed PT), OT (By licensed OT), Speech therapy     PT Frequency: 5x week OT Frequency: 5x week     Speech Therapy Frequency: 5x week      Contractures Contractures Info: Not present    Additional Factors Info  Code Status, Allergies Code Status Info: Full Code Allergies Info: Donepezil           Current Medications (06/30/2018):  This is the current hospital active medication list Current Facility-Administered Medications  Medication Dose Route Frequency Provider Last Rate Last Dose  . acetaminophen (TYLENOL) tablet 650 mg  650 mg Oral Q6H Cathlyn Parsons, PA-C   650 mg at 06/30/18 7741  . cefTRIAXone (ROCEPHIN) 1 g in sodium chloride 0.9 % 100 mL IVPB  1 g Intravenous Q24H Jamse Arn, MD      . donepezil (ARICEPT) tablet 5 mg  5 mg Oral Q breakfast Cathlyn Parsons, PA-C   5 mg at 06/30/18 0839  . feeding supplement (PRO-STAT SUGAR FREE 64) liquid 30 mL  30 mL Oral BID Jamse Arn, MD   30 mL at 06/30/18 0837  . levETIRAcetam (KEPPRA) tablet 500 mg  500 mg Oral BID Cathlyn Parsons, PA-C   500 mg at 06/30/18 2878  . levothyroxine (  SYNTHROID, LEVOTHROID) tablet 75 mcg  75 mcg Oral QAC breakfast Cathlyn Parsons, PA-C   75 mcg at 06/30/18 0555  . lisinopril (PRINIVIL,ZESTRIL) tablet 2.5 mg  2.5 mg Oral Daily Jamse Arn, MD   2.5 mg at 06/30/18 0837  . magnesium oxide (MAG-OX) tablet 200 mg  200 mg Oral BID Jamse Arn, MD   200 mg at 06/30/18 7711  . naloxone [facility]) injection 0.4 mg  0.4 mg Intravenous Once Angiulli, Lavon Paganini, PA-C      . ondansetron [facility]) tablet 4 mg  4 mg Oral Q6H PRN Angiulli, Lavon Paganini, PA-C       Or  . ondansetron (ZOFRAN) injection 4 mg  4 mg Intravenous Q6H  PRN Angiulli, Lavon Paganini, PA-C      . polyethylene glycol (MIRALAX / GLYCOLAX) packet 17 g  17 g Oral Daily PRN Angiulli, Lavon Paganini, PA-C      . risperiDONE (RISPERDAL) tablet 0.5 mg  0.5 mg Oral Daily Plotnikov, Evie Lacks, MD   0.5 mg at 06/29/18 1557  . sorbitol 70 % solution 30 mL  30 mL Oral Daily PRN Angiulli, Lavon Paganini, PA-C      . traZODone (DESYREL) tablet 25 mg  25 mg Oral QHS PRN Jamse Arn, MD   25 mg at 06/29/18 2053     Discharge Medications: Please see discharge summary for a list of discharge medications.  Relevant Imaging Results:  Relevant Lab Results:   Additional Information SSN: [id number]  Sheniece Ruggles, Gardiner Rhyme, LCSW

## 2018-06-30 NOTE — Progress Notes (Signed)
Speech Language Pathology Discharge Summary  Patient Details  Name: Misty Carroll MRN: [id number] Date of Birth: 11/22/1932  Today's Date: 07/05/2018 SLP Individual Time: 0930-1025 SLP Individual Time Calculation (min): 55 min   Skilled Therapeutic Interventions:  Skilled treatment session focused on cognitive-linguistic goals.  SLP facilitated session by providing Max A verbal cues for naming functional items with 50% accuracy and identified functional items from a field of 3 by their function with 100% accuracy. Patient sorted change from a field of 4 with 100% accuracy and required Max A verbal cues to generate specific amounts of money.  Patient transferred back to recliner at end of session and left with daughter present. Continue with current plan of care.   Patient has met 1 of 7 long term goals.  Patient to discharge at [facility] Max;Total level.   Reasons goals not met: decline in function from initial evaluation with inconsistent progress towards goals despite downgrading goals   Clinical Impression/Discharge Summary:   Pt has made limited gains while inpatient and is discharging  having met 1 out of 7 long term goals.  Pt is currently max to total assist for tasks due to significant cognitive-linguistic deficits.   Pt has had functional decline since initial evaluation on CIR of unclear etiology and has had variable performance on basic functional tasks.  Pt and family education is complete for this venue of care.  Pt is discharging to SNF with recommendations for ST follow up at next level of care.    Care Partner:  Caregiver Able to Provide Assistance: Other (comment)(family now wanting SNF)  Type of Caregiver Assistance: (family now wanting SNF )  Recommendation:  Skilled Nursing facility  Rationale for SLP Follow Up: Maximize functional communication;Maximize cognitive function and independence;Reduce caregiver burden   Equipment: none recommended by SLP    Reasons for  discharge: Discharged from hospital   Patient/Family Agrees with Progress Made and Goals Achieved: Yes    Page, Selinda Orion 06/30/2018, 4:04 PM

## 2018-06-30 NOTE — Progress Notes (Signed)
OT completed, voided with OT, ua pending till pt void. Then IV abt to start. Family aware. Will continue to monitor.

## 2018-07-01 LAB — URINE CULTURE: Culture: 20000 — AB

## 2018-07-01 NOTE — Plan of Care (Signed)
  Problem: Spiritual Needs Goal: Ability to function at adequate level Outcome: Progressing   Problem: Consults Goal: RH BRAIN INJURY PATIENT EDUCATION Description Description: See Patient Education module for eduction specifics Outcome: Progressing Goal: Skin Care Protocol Initiated - if Braden Score 18 or less Description If consults are not indicated, leave blank or document N/A Outcome: Progressing   Problem: RH BOWEL ELIMINATION Goal: RH STG MANAGE BOWEL WITH ASSISTANCE Description STG Manage Bowel with mod Assistance.  Outcome: Progressing Goal: RH STG MANAGE BOWEL W/MEDICATION W/ASSISTANCE Description STG Manage Bowel with Medication with mod Assistance.  Outcome: Progressing   Problem: RH SKIN INTEGRITY Goal: RH STG SKIN FREE OF INFECTION/BREAKDOWN Description Patients skin will remain free from further infection or breakdown with mod assist.  Outcome: Progressing Goal: RH STG MAINTAIN SKIN INTEGRITY WITH ASSISTANCE Description STG Maintain Skin Integrity With mod Assistance.  Outcome: Progressing   Problem: RH PAIN MANAGEMENT Goal: RH STG PAIN MANAGED AT OR BELOW PT'S PAIN GOAL Description < 3  Outcome: Progressing   Problem: RH SAFETY Goal: RH STG ADHERE TO SAFETY PRECAUTIONS W/ASSISTANCE/DEVICE Description STG Adhere to Safety Precautions With mod Assistance/Device.  Outcome: Not Progressing Goal: RH STG DECREASED RISK OF FALL WITH ASSISTANCE Description STG Decreased Risk of Fall With mod Assistance.  Outcome: Not Progressing   Problem: RH COGNITION-NURSING Goal: RH STG USES MEMORY AIDS/STRATEGIES W/ASSIST TO PROBLEM SOLVE Description STG Uses Memory Aids/Strategies With mod Assistance to Problem Solve.  Outcome: Not Progressing Goal: RH STG ANTICIPATES NEEDS/CALLS FOR ASSIST W/ASSIST/CUES Description STG Anticipates Needs/Calls for Assist With mod Assistance/Cues.  Outcome: Not Progressing

## 2018-07-01 NOTE — Progress Notes (Signed)
[place] PHYSICAL MEDICINE & REHABILITATION PROGRESS NOTE   Subjective/Complaints: Patient seen laying in bed this morning.  No reported issues.  Cognition appears slightly improved this morning.  ROS: Limited due to cognition.  Objective:   No results found. No results for input(s): WBC, HGB, HCT, PLT in the last 72 hours. Recent Labs    06/29/18 0945 06/29/18 1851  NA 133* 133*  K 4.0 4.1  CL 98 98  CO2 25 30  GLUCOSE 156* 118*  BUN 13 21  CREATININE 0.77 0.80  CALCIUM 9.2 9.2    Intake/Output Summary (Last 24 hours) at 07/01/2018 1320 Last data filed at 07/01/2018 0900 Gross per 24 hour  Intake 562 ml  Output -  Net 562 ml     Physical Exam: Vital Signs Blood pressure (!) 153/67, pulse 69, temperature 98 F (36.7 C), temperature source Oral, resp. rate 16, height 5\' 5"  (1.651 m), weight 54.5 kg, SpO2 99 %. Constitutional: No distress . Vital signs reviewed. HENT: Normocephalic.  Atraumatic. Eyes: EOMI. No discharge. Cardiovascular: Irregularly irregular.  No JVD. Respiratory: CTA bilaterally.  Normal effort. GI: BS +. Non-distended. Musc: No edema or tenderness in extremities. Neurological: Alert. Global aphasia HOH Delays in processing.   Motor: Bilateral UE 4+/5 proximal to distal, when willing to participate, stable RLE: 4/5 proximal to distal, when willing to participate, stable LLE: 4/5 prox to distal, when willing to participate Skin: Skin iswarm. Psychiatric: Confused, tangential, and disoriented  Assessment/Plan: 1. Functional deficits secondary to TBI, left fnf  which require 3+ hours per day of interdisciplinary therapy in a comprehensive inpatient rehab setting.  Physiatrist is providing close team supervision and 24 hour management of active medical problems listed below.  Physiatrist and rehab team continue to assess barriers to discharge/monitor patient progress toward functional and medical goals  Care Tool:  Bathing    Body  parts bathed by patient: Right arm, Left arm, Chest, Abdomen, Front perineal area, Buttocks, Right upper leg, Left upper leg, Right lower leg, Left lower leg, Face         Bathing assist Assist Level: Supervision/Verbal cueing     Upper Body Dressing/Undressing Upper body dressing   What is the patient wearing?: Pull over shirt, Bra    Upper body assist Assist Level: Supervision/Verbal cueing    Lower Body Dressing/Undressing Lower body dressing      What is the patient wearing?: Underwear/pull up, Pants     Lower body assist Assist for lower body dressing: Supervision/Verbal cueing     Toileting Toileting Toileting Activity did not occur (Clothing management and hygiene only): Safety/medical concerns  Toileting assist Assist for toileting: Supervision/Verbal cueing Assistive Device Comment: BSC   Transfers Chair/bed transfer  Transfers assist  Chair/bed transfer activity did not occur: Safety/medical concerns  Chair/bed transfer assist level: Supervision/Verbal cueing     Locomotion Ambulation   Ambulation assist      Assist level: Contact Guard/Touching assist Assistive device: Walker-rolling Max distance: 221ft   Walk 10 feet activity   Assist     Assist level: Contact Guard/Touching assist Assistive device: Walker-rolling   Walk 50 feet activity   Assist    Assist level: Contact Guard/Touching assist Assistive device: Walker-rolling    Walk 150 feet activity   Assist Walk 150 feet activity did not occur: Safety/medical concerns  Assist level: Contact Guard/Touching assist Assistive device: Walker-rolling    Walk 10 feet on uneven surface  activity   Assist Walk 10 feet on uneven surfaces activity  did not occur: Safety/medical concerns   Assist level: Supervision/Verbal cueing     Wheelchair     Assist Will patient use wheelchair at discharge?: No   Wheelchair activity did not occur: N/A         Wheelchair 50 feet  with 2 turns activity    Assist    Wheelchair 50 feet with 2 turns activity did not occur: N/A       Wheelchair 150 feet activity     Assist Wheelchair 150 feet activity did not occur: N/A         Medical Problem List and Plan: 1.Decreased functional mobilitysecondary to traumatic SAH/TBI and left femoral neck fracture. Status post ORIF 06/13/2018.  Continue CIR  DC to SNF  Follow-up cranial CT scan 12/18 reviewed, stable.   Weightbearing as tolerated 2. DVT Prophylaxis/Anticoagulation: SCDs. Dopplers limited, but no DVT noted 3. Pain Management:Robaxin as needed 4. Mood:Aricept 5 mg daily, Risperdal 1 mg daily  Continue tele-sitter 5. Neuropsych: This patientis not capable of making decisions on herown behalf. 6. Skin/Wound Care:Routine skin checks 7. Fluids/Electrolytes/Nutrition:Routine in and out's  Inconsistent nutritional intake on 12/28 8. Seizure prophylaxis. Keppra 500 mg twice a day 9. Hypothyroidism. Synthroid 10. Constipation. Laxative assistance 11.  Labile blood pressure  Orthostatics remain pending on 12/28  Lisinopril 2.5 started on 12/25  Labile on 12/28  Continue to monitor 12.  Hypokalemia:   Potassium 4.1 on 12/26  Supplement initiated  Magnesium 1.7 on 12/24 - supplement initiated  Labs ordered for Monday  Continue to monitor 13.  Acute blood loss anemia: Resolved  Hemoglobin 12.2 on 12/23  Continue to monitor 14.  Hypoalbuminemia  Supplement initiated on 12/16 15.  Sleep disturbance  EKG on 12/11 reviewed, trazodone started on 12/17, however not given, changed to as needed  May try tramadol at night as patient used to get good relief of pain and sleep with this medication 16.  Hyponatremia  Sodium 133 on 12/26  Labs ordered for Monday  Continue to monitor 17.  Nonresponsiveness: Resolved  CT stable, EEG negative, labs relatively unremarkable  Suspect related to hydration status and orthostasis  Teds/abdominal binder  as needed 18.  Hyperglycemia  Labile on 12/27 19.  Abnormal urinalysis  Urine culture with multiple species  Continue empiric Rocephin started 12/27   LOS: 15 days A FACE TO FACE EVALUATION WAS PERFORMED  Gresham Caetano Lorie Phenix 07/01/2018, 1:20 PM

## 2018-07-02 NOTE — Plan of Care (Signed)
  Problem: RH SKIN INTEGRITY Goal: RH STG SKIN FREE OF INFECTION/BREAKDOWN Description Patients skin will remain free from further infection or breakdown with mod assist.  Outcome: Progressing Goal: RH STG MAINTAIN SKIN INTEGRITY WITH ASSISTANCE Description STG Maintain Skin Integrity With mod Assistance.  Outcome: Progressing   Problem: RH PAIN MANAGEMENT Goal: RH STG PAIN MANAGED AT OR BELOW PT'S PAIN GOAL Description < 3  Outcome: Progressing   Problem: RH BOWEL ELIMINATION Goal: RH STG MANAGE BOWEL WITH ASSISTANCE Description STG Manage Bowel with mod Assistance.  Outcome: Not Progressing Goal: RH STG MANAGE BOWEL W/MEDICATION W/ASSISTANCE Description STG Manage Bowel with Medication with mod Assistance.  Outcome: Not Progressing   Problem: RH SAFETY Goal: RH STG ADHERE TO SAFETY PRECAUTIONS W/ASSISTANCE/DEVICE Description STG Adhere to Safety Precautions With mod Assistance/Device.  Outcome: Not Progressing Goal: RH STG DECREASED RISK OF FALL WITH ASSISTANCE Description STG Decreased Risk of Fall With mod Assistance.  Outcome: Not Progressing   Problem: RH COGNITION-NURSING Goal: RH STG USES MEMORY AIDS/STRATEGIES W/ASSIST TO PROBLEM SOLVE Description STG Uses Memory Aids/Strategies With mod Assistance to Problem Solve.  Outcome: Not Progressing Goal: RH STG ANTICIPATES NEEDS/CALLS FOR ASSIST W/ASSIST/CUES Description STG Anticipates Needs/Calls for Assist With mod Assistance/Cues.  Outcome: Not Progressing

## 2018-07-02 NOTE — Progress Notes (Signed)
Occupational Therapy Weekly Progress Note  Patient Details  Name: Misty Carroll MRN: [id number] Date of Birth: 06-25-1933  Beginning of progress report period: June 23, 2018 End of progress report period: July 02, 2018   Short term goals were not set this reporting period as pt was planned to d/c home with 24 hr care on 12/28. On 12/27, pt's family made decision that they would be unable to provide the extensive amount of care pt requires and that they would like to pursue SNF option. Therefore, CIR stay extended until SNF placement.  Pt is performing basic ADLs at overall supervision-occasional CGA. Min A required for donning shoes. She requires mod-max cuing for basic ADLs, pathfinding, and sequencing. Pt's daughter has been present throughout rehab admission and pt receptive to her cuing. Pt can fluctuate throughout the day and btwn days cognitively and in her expressive language abilities. At times, pt intelligible.  Pt's dementia as well as new cognitive deficits are most limiting to her for independence. Cont to address LTGs of supervision overall and for increased consistency in performance.    Patient continues to demonstrate the following deficits: muscle weakness, decreased cardiorespiratoy endurance, decreased attention, decreased awareness, decreased problem solving, decreased safety awareness, decreased memory and delayed processing and decreased standing balance, decreased postural control and decreased balance strategies and therefore will continue to benefit from skilled OT intervention to enhance overall performance with BADL and Reduce care partner burden.  Patient progressing toward long term goals..  Continue plan of care.  OT Short Term Goals Week 2:  OT Short Term Goal 1 (Week 2): Cont to work towards LTG of supervision overall OT Short Term Goal 1 - Progress (Week 2): Progressing toward goal Week 3:  OT Short Term Goal 1 (Week 3): Cont to work towards LTG of  supervision overall  Therapy Documentation Precautions:  Precautions Precautions: Fall Restrictions Weight Bearing Restrictions: Yes LLE Weight Bearing: Weight bearing as tolerated  Lachrista Heslin L 07/02/2018, 6:53 AM

## 2018-07-02 NOTE — Progress Notes (Signed)
[place] PHYSICAL MEDICINE & REHABILITATION PROGRESS NOTE   Subjective/Complaints: Patient seen laying in bed this AM.  No reported issues overnight.  She states she slept well.   ROS: Limited due to cognition.  Objective:   No results found. No results for input(s): WBC, HGB, HCT, PLT in the last 72 hours. No results for input(s): NA, K, CL, CO2, GLUCOSE, BUN, CREATININE, CALCIUM in the last 72 hours.  Intake/Output Summary (Last 24 hours) at 07/02/2018 2105 Last data filed at 07/02/2018 1824 Gross per 24 hour  Intake 240 ml  Output -  Net 240 ml     Physical Exam: Vital Signs Blood pressure 131/63, pulse 70, temperature 98.9 F (37.2 C), resp. rate 12, height 5\' 5"  (1.651 m), weight 54.5 kg, SpO2 98 %. Constitutional: No distress . Vital signs reviewed. HENT: Normocephalic.  Atraumatic. Eyes: EOMI. No discharge. Cardiovascular: Irregularly irregular.  No JVD. Respiratory: CTA bilaterally.  Normal effort. GI: BS +. Non-distended. Musc: No edema or tenderness in extremities. Neurological: Alert. Global aphasia HOH Delays in processing.   Motor: Bilateral UE 4+/5 proximal to distal, when willing to participate, unchanged RLE: 4/5 proximal to distal, when willing to participate, unchanged LLE: 4/5 prox to distal, when willing to participate, unchanged Skin: Skin iswarm. Psychiatric: Confused, tangential, and disoriented  Assessment/Plan: 1. Functional deficits secondary to TBI, left fnf  which require 3+ hours per day of interdisciplinary therapy in a comprehensive inpatient rehab setting.  Physiatrist is providing close team supervision and 24 hour management of active medical problems listed below.  Physiatrist and rehab team continue to assess barriers to discharge/monitor patient progress toward functional and medical goals  Care Tool:  Bathing    Body parts bathed by patient: Right arm, Left arm, Chest, Abdomen, Front perineal area, Buttocks, Right upper  leg, Left upper leg, Right lower leg, Left lower leg, Face         Bathing assist Assist Level: Supervision/Verbal cueing     Upper Body Dressing/Undressing Upper body dressing   What is the patient wearing?: Pull over shirt, Bra    Upper body assist Assist Level: Supervision/Verbal cueing    Lower Body Dressing/Undressing Lower body dressing      What is the patient wearing?: Underwear/pull up, Pants     Lower body assist Assist for lower body dressing: Supervision/Verbal cueing     Toileting Toileting Toileting Activity did not occur (Clothing management and hygiene only): Safety/medical concerns  Toileting assist Assist for toileting: Contact Guard/Touching assist Assistive Device Comment: BSC   Transfers Chair/bed transfer  Transfers assist  Chair/bed transfer activity did not occur: Safety/medical concerns  Chair/bed transfer assist level: Supervision/Verbal cueing     Locomotion Ambulation   Ambulation assist      Assist level: Contact Guard/Touching assist Assistive device: Walker-rolling Max distance: 271ft   Walk 10 feet activity   Assist     Assist level: Contact Guard/Touching assist Assistive device: Walker-rolling   Walk 50 feet activity   Assist    Assist level: Contact Guard/Touching assist Assistive device: Walker-rolling    Walk 150 feet activity   Assist Walk 150 feet activity did not occur: Safety/medical concerns  Assist level: Contact Guard/Touching assist Assistive device: Walker-rolling    Walk 10 feet on uneven surface  activity   Assist Walk 10 feet on uneven surfaces activity did not occur: Safety/medical concerns   Assist level: Supervision/Verbal cueing     Wheelchair     Assist Will patient use wheelchair at discharge?:  No   Wheelchair activity did not occur: N/A         Wheelchair 50 feet with 2 turns activity    Assist    Wheelchair 50 feet with 2 turns activity did not occur:  N/A       Wheelchair 150 feet activity     Assist Wheelchair 150 feet activity did not occur: N/A         Medical Problem List and Plan: 1.Decreased functional mobilitysecondary to traumatic SAH/TBI and left femoral neck fracture. Status post ORIF 06/13/2018.  Continue CIR  DC to SNF  Follow-up cranial CT scan 12/18 reviewed, stable.   Weightbearing as tolerated 2. DVT Prophylaxis/Anticoagulation: SCDs. Dopplers limited, but no DVT noted 3. Pain Management:Robaxin as needed 4. Mood:Aricept 5 mg daily, Risperdal 1 mg daily  Continue tele-sitter 5. Neuropsych: This patientis not capable of making decisions on herown behalf. 6. Skin/Wound Care:Routine skin checks 7. Fluids/Electrolytes/Nutrition:Routine in and out's  Inconsistent nutritional intake on 12/28 8. Seizure prophylaxis. Keppra 500 mg twice a day 9. Hypothyroidism. Synthroid 10. Constipation. Laxative assistance 11.  Labile blood pressure  Orthostatics remain pending on 12/29  Lisinopril 2.5 started on 12/25  Relatively controlled on 12/29  Continue to monitor 12.  Hypokalemia:   Potassium 4.1 on 12/26  Supplement initiated  Magnesium 1.7 on 12/24 - supplement initiated  Labs ordered for tomorrow  Continue to monitor 13.  Acute blood loss anemia: Resolved  Hemoglobin 12.2 on 12/23  Continue to monitor 14.  Hypoalbuminemia  Supplement initiated on 12/16 15.  Sleep disturbance  EKG on 12/11 reviewed, trazodone started on 12/17, however not given, changed to as needed  May try tramadol at night as patient used to get good relief of pain and sleep with this medication 16.  Hyponatremia  Sodium 133 on 12/26  Labs ordered for tomorrow  Continue to monitor 17.  Nonresponsiveness: Resolved  CT stable, EEG negative, labs relatively unremarkable  Suspect related to hydration status and orthostasis  Teds/abdominal binder as needed 18.  Hyperglycemia  Labile on 12/27 19.  Abnormal  urinalysis  Urine culture with multiple species  Continue empiric Rocephin started 12/27   LOS: 16 days A FACE TO FACE EVALUATION WAS PERFORMED  Misty Carroll Misty Carroll 07/02/2018, 9:05 PM

## 2018-07-03 ENCOUNTER — Inpatient Hospital Stay (HOSPITAL_COMMUNITY): Payer: Medicare Other | Admitting: Occupational Therapy

## 2018-07-03 ENCOUNTER — Inpatient Hospital Stay (HOSPITAL_COMMUNITY): Payer: Medicare Other

## 2018-07-03 ENCOUNTER — Inpatient Hospital Stay (HOSPITAL_COMMUNITY): Payer: Medicare Other | Admitting: Physical Therapy

## 2018-07-03 LAB — CBC WITH DIFFERENTIAL/PLATELET
Abs Immature Granulocytes: 0.01 10*3/uL (ref 0.00–0.07)
BASOS ABS: 0 10*3/uL (ref 0.0–0.1)
Basophils Relative: 1 %
Eosinophils Absolute: 0 10*3/uL (ref 0.0–0.5)
Eosinophils Relative: 1 %
HEMATOCRIT: 37.5 % (ref 36.0–46.0)
Hemoglobin: 12.1 g/dL (ref 12.0–15.0)
Immature Granulocytes: 0 %
LYMPHS ABS: 1.1 10*3/uL (ref 0.7–4.0)
LYMPHS PCT: 30 %
MCH: 31.2 pg (ref 26.0–34.0)
MCHC: 32.3 g/dL (ref 30.0–36.0)
MCV: 96.6 fL (ref 80.0–100.0)
Monocytes Absolute: 0.3 10*3/uL (ref 0.1–1.0)
Monocytes Relative: 8 %
Neutro Abs: 2.2 10*3/uL (ref 1.7–7.7)
Neutrophils Relative %: 60 %
Platelets: 243 10*3/uL (ref 150–400)
RBC: 3.88 MIL/uL (ref 3.87–5.11)
RDW: 14.1 % (ref 11.5–15.5)
WBC: 3.6 10*3/uL — AB (ref 4.0–10.5)
nRBC: 0 % (ref 0.0–0.2)

## 2018-07-03 LAB — BASIC METABOLIC PANEL
Anion gap: 13 (ref 5–15)
BUN: 16 mg/dL (ref 8–23)
CHLORIDE: 95 mmol/L — AB (ref 98–111)
CO2: 28 mmol/L (ref 22–32)
Calcium: 8.9 mg/dL (ref 8.9–10.3)
Creatinine, Ser: 0.8 mg/dL (ref 0.44–1.00)
GFR calc non Af Amer: >60 mL/min (ref >60)
Glucose, Bld: 173 mg/dL — ABNORMAL HIGH (ref 70–99)
Potassium: 3.1 mmol/L — ABNORMAL LOW (ref 3.5–5.1)
Sodium: 136 mmol/L (ref 135–145)

## 2018-07-03 NOTE — Progress Notes (Signed)
Speech Language Pathology Daily Session Note  Patient Details  Name: [place] DOST MRN: [id number] Date of Birth: [birth year]  Today's Date: 07/03/2018 SLP Individual Time: 1346-1425 SLP Individual Time Calculation (min): 39 min  Short Term Goals: Week 2: SLP Short Term Goal 1 (Week 2): Length of stay now longer than originally anticipated due to medical issues with decline in function, continue working towards LTG  Skilled Therapeutic Interventions:Skilled ST services focused on cognitive skills. SLP facilitated orientation to place, situation and time given multimodal cues, pt required Max A cues for 60% accuracy. SLP facilitated basic problem solving skills with sequencing of 2 cards, pt required total-max A multimodal cues. SLP facilitated basic problem solving skills when brush teeth, pt required max A verbal cues and swallow despite given multimodal cues to spit. Pt requested to rest, "I am trued no more." Pt was left in room with call bell within reach, daughter in room and bed alarm set. SLP reccomends to continue skilled services.      Pain Pain Assessment Faces Pain Scale: Hurts little more Pain Type: Acute pain Pain Location: Leg Pain Orientation: Left Pain Descriptors / Indicators: Aching Pain Intervention(s): Relaxation;RN made aware  Therapy/Group: Individual Therapy  Shivaun Bilello  [facility] 07/03/2018, 2:30 PM

## 2018-07-03 NOTE — Progress Notes (Signed)
Physical Therapy Session Note  Patient Details  Name: Misty Carroll MRN: [id number] Date of Birth: 1932-11-08  Today's Date: 07/03/2018 PT Individual Time: 1100-1155 PT Individual Time Calculation (min): 55 min   Short Term Goals: Week 2:  PT Short Term Goal 1 (Week 2): =LTG due to estimated LOS  Skilled Therapeutic Interventions/Progress Updates: Pt received seated in recliner, daughter present; denies pain and agreeable to treatment. Gait with UE leading IV pole; returned to no AD d/t difficulty managing IV pole far enough away from body to prevent LEs from tripping. Performed dynamic UE reaching, standing balance/endurance while taking ornaments off christmas tree with min guard for balance. BUE dynavision for focus on reaction speed, visual scanning, attention; requires moderate cues to recall correct technique (hitting lit buttons vs unlit buttons). Fatigues quickly with standing, reports LE pain and requires seated rest breaks d/t fatigue. BUE/BLE nustep x10 min total, level 5 for strengthening and aerobic endurance, cues to maintain >20 steps/min. Gait x75' no AD and min guard until limited by L hip pain. Returned to room totalA in w/c. Stand pivot w/c>recliner with daughter providing min guard. Remained seated in recliner, daughter present and all needs in reach at completion of session.      Therapy Documentation Precautions:  Precautions Precautions: Fall Restrictions Weight Bearing Restrictions: Yes LLE Weight Bearing: Weight bearing as tolerated    Therapy/Group: Individual Therapy  Corliss Skains 07/03/2018, 11:54 AM

## 2018-07-03 NOTE — Progress Notes (Signed)
[facility] [facility] PHYSICAL MEDICINE & REHABILITATION PROGRESS NOTE   Subjective/Complaints: Patient seen sitting up in a chair this morning.  No reported issues overnight.  Family at bedside.  ROS: Limited due to cognition.  Objective:   No results found. No results for input(s): WBC, HGB, HCT, PLT in the last 72 hours. No results for input(s): NA, K, CL, CO2, GLUCOSE, BUN, CREATININE, CALCIUM in the last 72 hours.  Intake/Output Summary (Last 24 hours) at 07/03/2018 0906 Last data filed at 07/03/2018 0839 Gross per 24 hour  Intake 400 ml  Output -  Net 400 ml     Physical Exam: Vital Signs Blood pressure 120/68, pulse 97, temperature 97.8 F (36.6 C), temperature source Oral, resp. rate 16, height 5\' 5"  (1.651 m), weight 54.5 kg, SpO2 95 %. Constitutional: No distress . Vital signs reviewed. HENT: Normocephalic.  Atraumatic. Eyes: EOMI. No discharge. Cardiovascular: Irregularly irregular.  No JVD. Respiratory: CTA bilaterally.  Normal effort. GI: BS +. Non-distended. Musc: No edema or tenderness in extremities. Neurological: Alert. Global aphasia HOH Delays in processing, unchanged.   Motor: Bilateral UE 4+/5 proximal to distal, when willing to participate, unchanged RLE: 4/5 proximal to distal, when willing to participate, unchanged LLE: 4/5 prox to distal, when willing to participate, unchanged Skin: Skin iswarm. Psychiatric: Confused, tangential, and disoriented  Assessment/Plan: 1. Functional deficits secondary to TBI, left fnf  which require 3+ hours per day of interdisciplinary therapy in a comprehensive inpatient rehab setting.  Physiatrist is providing close team supervision and 24 hour management of active medical problems listed below.  Physiatrist and rehab team continue to assess barriers to discharge/monitor patient progress toward functional and medical goals  Care Tool:  Bathing    Body parts bathed by patient: Right arm, Left arm, Chest, Abdomen,  Front perineal area, Buttocks, Right upper leg, Left upper leg, Right lower leg, Left lower leg, Face         Bathing assist Assist Level: Supervision/Verbal cueing     Upper Body Dressing/Undressing Upper body dressing   What is the patient wearing?: Pull over shirt, Bra    Upper body assist Assist Level: Supervision/Verbal cueing    Lower Body Dressing/Undressing Lower body dressing      What is the patient wearing?: Underwear/pull up, Pants     Lower body assist Assist for lower body dressing: Supervision/Verbal cueing     Toileting Toileting Toileting Activity did not occur (Clothing management and hygiene only): Safety/medical concerns  Toileting assist Assist for toileting: Contact Guard/Touching assist Assistive Device Comment: BSC   Transfers Chair/bed transfer  Transfers assist  Chair/bed transfer activity did not occur: Safety/medical concerns  Chair/bed transfer assist level: Supervision/Verbal cueing     Locomotion Ambulation   Ambulation assist      Assist level: Contact Guard/Touching assist Assistive device: Walker-rolling Max distance: 266ft   Walk 10 feet activity   Assist     Assist level: Contact Guard/Touching assist Assistive device: Walker-rolling   Walk 50 feet activity   Assist    Assist level: Contact Guard/Touching assist Assistive device: Walker-rolling    Walk 150 feet activity   Assist Walk 150 feet activity did not occur: Safety/medical concerns  Assist level: Contact Guard/Touching assist Assistive device: Walker-rolling    Walk 10 feet on uneven surface  activity   Assist Walk 10 feet on uneven surfaces activity did not occur: Safety/medical concerns   Assist level: Supervision/Verbal cueing     Wheelchair     Assist Will patient use  wheelchair at discharge?: No   Wheelchair activity did not occur: N/A         Wheelchair 50 feet with 2 turns activity    Assist    Wheelchair 50  feet with 2 turns activity did not occur: N/A       Wheelchair 150 feet activity     Assist Wheelchair 150 feet activity did not occur: N/A         Medical Problem List and Plan: 1.Decreased functional mobilitysecondary to traumatic SAH/TBI and left femoral neck fracture. Status post ORIF 06/13/2018.  Continue CIR  DC to SNF pending  Follow-up cranial CT scan 12/18 reviewed, stable.   Weightbearing as tolerated 2. DVT Prophylaxis/Anticoagulation: SCDs. Dopplers limited, but no DVT noted 3. Pain Management:Robaxin as needed 4. Mood:Aricept 5 mg daily, Risperdal 1 mg daily  Continue tele-sitter 5. Neuropsych: This patientis not capable of making decisions on herown behalf. 6. Skin/Wound Care:Routine skin checks 7. Fluids/Electrolytes/Nutrition:Routine in and out's  Inconsistent nutritional intake on 12/30 8. Seizure prophylaxis. Keppra 500 mg twice a day 9. Hypothyroidism. Synthroid 10. Constipation. Laxative assistance 11.  Labile blood pressure  Orthostatics remain pending on 12/29  Lisinopril 2.5 started on 12/25  Relatively controlled on 12/30  Continue to monitor 12.  Hypokalemia:   Potassium 4.1 on 12/26  Supplement initiated  Magnesium 1.7 on 12/24 - supplement initiated  Labs pending  Continue to monitor 13.  Acute blood loss anemia: Resolved  Hemoglobin 12.2 on 12/23  Continue to monitor 14.  Hypoalbuminemia  Supplement initiated on 12/16 15.  Sleep disturbance  EKG on 12/11 reviewed, trazodone started on 12/17, however not given, changed to as needed  May try tramadol at night as patient used to get good relief of pain and sleep with this medication 16.  Hyponatremia  Sodium 133 on 12/26  Labs pending  Continue to monitor 17.  Nonresponsiveness: Resolved  CT stable, EEG negative, labs relatively unremarkable  Suspect related to hydration status and orthostasis  Teds/abdominal binder as needed 18.  Hyperglycemia  Labile on 12/27 19.   Abnormal urinalysis  Urine culture with multiple species  Continue empiric Rocephin started 12/27   LOS: 17 days A FACE TO FACE EVALUATION WAS PERFORMED  Ladina Shutters Lorie Phenix 07/03/2018, 9:06 AM

## 2018-07-03 NOTE — Progress Notes (Signed)
Occupational Therapy Session Note  Patient Details  Name: Misty Carroll MRN: [id number] Date of Birth: 11-Sep-1932  Today's Date: 07/03/2018 OT Individual Time: 1300-1340 OT Individual Time Calculation (min): 40 min    Short Term Goals: Week 3:  OT Short Term Goal 1 (Week 3): Cont to work towards LTG of supervision overall    Skilled Therapeutic Interventions/Progress Updates:    Pt greeted in room with dtr present. ADL needs met. She ambulated with RW and steady assist to therapy apartment. Tried to engage pt in familiar IADL tasks, such as vacuuming, washing countertops, and sweeping floor to work on dynamic balance and cognition. Pt perseverated on pain in L LE (OT notified RN of pts request for pain medicine). Unable to be redirected to task, even when ambulating in kitchen and holding broom. She did rewrap vacuum chord with mod vcs while sitting in couch. Pt ambulated back to room and returned to bed. Stated she was unable to assist with doffing footwear, so OT doffed shoes. Provided her with ice pack for Lt hip with RN reporting he was ordering pt k-pad. Advised dtr to remove ice pack in 15-20 minutes. Pt left resting comfortably with dtr present.   Therapy Documentation Precautions:  Precautions Precautions: Fall Restrictions Weight Bearing Restrictions: Yes LLE Weight Bearing: Weight bearing as tolerated Vital Signs: Therapy Vitals Temp: 97.6 F (36.4 C) Pulse Rate: 70 Resp: 14 BP: (!) 119/51 Patient Position (if appropriate): Lying Oxygen Therapy SpO2: 98 % O2 Device: Room Air Pain: Stated/addressed as written above  Pain Assessment Faces Pain Scale: Hurts little more Pain Type: Acute pain Pain Location: Leg Pain Orientation: Left Pain Descriptors / Indicators: Aching Pain Intervention(s): Relaxation;RN made aware ADL: ADL Grooming: Contact guard Where Assessed-Grooming: Standing at sink Upper Body Bathing: Moderate cueing Where Assessed-Upper Body Bathing:  Sitting at sink Lower Body Bathing: Moderate cueing, Contact guard Where Assessed-Lower Body Bathing: Sitting at sink, Standing at sink Upper Body Dressing: Minimal cueing Where Assessed-Upper Body Dressing: Sitting at sink Lower Body Dressing: Moderate assistance Where Assessed-Lower Body Dressing: Standing at sink Toileting: Minimal assistance Where Assessed-Toileting: Bedside Commode Toilet Transfer: Minimal assistance Toilet Transfer Method: Stand pivot Toilet Transfer Equipment: Bedside commode      Therapy/Group: Individual Therapy  [name] A [name] 07/03/2018, 4:32 PM

## 2018-07-03 NOTE — Progress Notes (Signed)
Occupational Therapy Session Note  Patient Details  Name: Misty Carroll MRN: [id number] Date of Birth: 03/14/1933  Today's Date: 07/03/2018 OT Individual Time: 1000-1100 OT Individual Time Calculation (min): 60 min    Short Term Goals: Week 3:  OT Short Term Goal 1 (Week 3): Cont to work towards LTG of supervision overall  Skilled Therapeutic Interventions/Progress Updates:    Pt seen for OT ADL bathing/dressing session. Pt sitting up in recliner upon arrival with daughter present. Pt awake and alert. Much clearer verbalization today and agreeable to tx session. Pt agreeable to showering this session. She ambulated throughout room with close supervision using RW. Required VCs for RW management in functional context, sometimes pushing walker out of the way and then requriing hand held- CGA to safely get to destination. She bathed seated on tub bench, mod cuing for sequencing of task. Stood with use of grab bar to complete pericare/buttock hygiene. Close supervision for dynamic standing balance in shower and bathed at overall supervision level. She returned to standard chair to dress. UB dressing completed with set-up assist. She is able to cross LEs into figure four position in order to thread B LEs into pants, stood without AD and guarding assist to pull pants up. Min A required to thread R shoe on completely due to limited ROM. She completed grooming tasks standing at sink with supervision, tolerating ~5 minutes in standing before requiring seated rest break. She then ambulated into bathroom with HHA and completed toileting task with close supervision using grab bar as steadying support.  Pt returned to recliner at end of session, left seated with daughter and RN present.   Therapy Documentation Precautions:  Precautions Precautions: Fall Restrictions Weight Bearing Restrictions: Yes LLE Weight Bearing: Weight bearing as tolerated Pain:   No/denies pain   Therapy/Group: Individual  Therapy  Brytani Voth L 07/03/2018, 7:13 AM

## 2018-07-04 ENCOUNTER — Inpatient Hospital Stay (HOSPITAL_COMMUNITY): Payer: Medicare Other

## 2018-07-04 ENCOUNTER — Inpatient Hospital Stay (HOSPITAL_COMMUNITY): Payer: Medicare Other | Admitting: Physical Therapy

## 2018-07-04 MED ORDER — MAGNESIUM OXIDE 400 (241.3 MG) MG PO TABS: 200.0000 mg | ORAL_TABLET | Freq: Two times a day (BID) | ORAL | 1 refills | Status: DC

## 2018-07-04 MED ORDER — ACETAMINOPHEN 325 MG PO TABS: 650.0000 mg | ORAL_TABLET | Freq: Four times a day (QID) | ORAL | Status: DC

## 2018-07-04 MED ORDER — LEVETIRACETAM 500 MG PO TABS: 500.0000 mg | ORAL_TABLET | Freq: Two times a day (BID) | ORAL | 1 refills | Status: DC

## 2018-07-04 MED ORDER — POTASSIUM CHLORIDE CRYS ER 20 MEQ PO TBCR
20.0000 meq | EXTENDED_RELEASE_TABLET | Freq: Two times a day (BID) | ORAL | Status: DC
  Administered 2018-07-04 – 2018-07-06 (×5): 20 meq via ORAL
  Filled 2018-07-04 (×5): qty 1

## 2018-07-04 MED ORDER — LEVOTHYROXINE SODIUM 75 MCG PO TABS: 75.0000 ug | ORAL_TABLET | Freq: Every day | ORAL | 1 refills | Status: DC

## 2018-07-04 MED ORDER — RISPERIDONE 0.5 MG PO TABS: 0.5000 mg | ORAL_TABLET | Freq: Every day | ORAL | 0 refills | Status: DC

## 2018-07-04 MED ORDER — SIMVASTATIN 20 MG PO TABS: 20.0000 mg | ORAL_TABLET | Freq: Every day | ORAL | 0 refills | Status: DC

## 2018-07-04 MED ORDER — LISINOPRIL 2.5 MG PO TABS: 2.5000 mg | ORAL_TABLET | Freq: Every day | ORAL | 1 refills | Status: DC

## 2018-07-04 MED ORDER — DONEPEZIL HCL 5 MG PO TABS: 5.0000 mg | ORAL_TABLET | Freq: Every day | ORAL | 0 refills | Status: DC

## 2018-07-04 NOTE — Patient Care Conference (Signed)
Inpatient RehabilitationTeam Conference and Plan of Care Update Date: 07/04/2018   Time: 10:25 Am    Patient Name: Misty Carroll      Medical Record Number: [id number]  Date of Birth: 04-16-1933 Sex: Female         Room/Bed: 4M08C/4M08C-01 Payor Info: Payor: MEDICARE / Plan: MEDICARE PART A AND B / Product Type: *No Product type* /    Admitting Diagnosis: TBI DEMENTIA  Admit Date/Time:  06/16/2018  6:18 PM Admission Comments: No comment available   Primary Diagnosis:  <principal problem not specified> Principal Problem: <principal problem not specified>  Patient Active Problem List   Diagnosis Date Noted  . Alzheimer's type dementia with late onset with behavioral disturbance ([place])   . Abnormal urinalysis   . Hyperglycemia   . Orthostasis   . Hyponatremia   . Sleep disturbance   . Closed nondisplaced intertrochanteric fracture of left femur ([place])   . Labile blood pressure   . Hypokalemia   . Acute blood loss anemia   . Hypoalbuminemia due to protein-calorie malnutrition ([place])   . Traumatic subdural hematoma ([place]) 06/16/2018  . Fall 06/13/2018  . Uterine prolapse 01/19/2017  . Cystocele, midline 01/19/2017  . Cystocele with small rectocele and uterine descent 09/03/2016  . Uterovaginal prolapse, incomplete 09/03/2016  . Left-sided epistaxis 01/25/2013    Expected Discharge Date: Expected Discharge Date: 07/01/18  Team Members Present: Physician leading conference: Dr. Delice Lesch Social Worker Present: Ovidio Kin, LCSW Nurse Present: Rayetta Pigg, RN PT Present: Kennyth Lose, PT OT Present: Other (comment)(Sandra Davis-OT) SLP Present: Charolett Bumpers, SLP PPS Coordinator present : Gunnar Fusi     Current Status/Progress Goal Weekly Team Focus  Medical   Decreased functional mobility secondary to traumatic SAH/TBI and left femoral neck fracture  Improve mobility, cognition, nutritional intake, hyponatrmia, hypokalemia, orthostatics  See above    Bowel/Bladder   continent of bowel and bladder, LBM 12-30  remain continent of bowwl and bladder, maintain regular bowel pattern  Assist with toileting prn   Swallow/Nutrition/ Hydration             ADL's   Supervision overall using RW, CGA without AD  Supervision overall  Awaiting SNF placement, cont addressing ADLs and cognitive remediation   Mobility   S overall  S overall  activity tolerance, LE strengthening, pt/family education   Communication             Safety/Cognition/ Behavioral Observations  max A  Mod to Max A - goals downgraded 12/20  basic problem solving, orientation and memory   Pain   pain managed with scheduled tylenol  pain <4   Assess pain q shift and prn and treat prn   Skin   left hip incision healed ota, ecchymosis noted   Remain free of infection skin remain intact with resolution of current issues  assess skin q shift and prn      *See Care Plan and progress notes for long and short-term goals.     Barriers to Discharge  Current Status/Progress Possible Resolutions Date Resolved   Physician    Medical stability;Decreased caregiver support;Lack of/limited family support     See above  Therapies, supplement K+, follow orthostatics, abx for abnormal UA, follow labs      Nursing                  PT                    OT  SLP                SW                Discharge Planning/Teaching Needs:  Plan changed to NHP and awaiting bed offers, family aware can go anytime. Family here daily for multiple hours to assist with her care.      Team Discussion:  Finishing IV antibiotics for UTI 1/1. DC telesitter due to doing better and not as impulsive. MD monitoring Bp ordered orthostatic to be checked by nursing. Looking for NH bed and pt is medically ready to go to then next venue.   Revisions to Treatment Plan:  NHP    Continued Need for Acute Rehabilitation Level of Care: The patient requires daily medical management by a physician  with specialized training in physical medicine and rehabilitation for the following conditions: Daily direction of a multidisciplinary physical rehabilitation program to ensure safe treatment while eliciting the highest outcome that is of practical value to the patient.: Yes Daily medical management of patient stability for increased activity during participation in an intensive rehabilitation regime.: Yes Daily analysis of laboratory values and/or radiology reports with any subsequent need for medication adjustment of medical intervention for : Neurological problems;Blood pressure problems;Other;Post surgical problems   I attest that I was present, lead the team conference, and concur with the assessment and plan of the team.   Elease Hashimoto 07/04/2018, 10:37 AM

## 2018-07-04 NOTE — Progress Notes (Signed)
Physical Therapy Weekly Progress Note  Patient Details  Name: GRACEY TOLLE MRN: [id number] Date of Birth: 1932-12-14  Beginning of progress report period: June 24, 2018 End of progress report period: July 04, 2018   Short term goals not set d/t estimated LOS. Pt with extension of rehab stay d/t multiple medical issues and prior to d/c home pt's family decided to pursue SNF placement. Pt continues to require S to [place] for mobility d/t strength and balance deficits as well as significant cognitive deficits.   Patient continues to demonstrate the following deficits muscle weakness, decreased cardiorespiratoy endurance, decreased initiation, decreased attention, decreased awareness, decreased problem solving, decreased safety awareness, decreased memory and delayed processing and decreased standing balance, decreased postural control and decreased balance strategies and therefore will continue to benefit from skilled PT intervention to increase functional independence with mobility.  Patient progressing toward long term goals..  Continue plan of care.  PT Short Term Goals Week 2:  PT Short Term Goal 1 (Week 2): =LTG due to estimated LOS Week 3:  PT Short Term Goal 1 (Week 3): =LTG due to estimated LOS    Corliss Skains 07/04/2018, 8:11 AM

## 2018-07-04 NOTE — Progress Notes (Signed)
Occupational Therapy Session Note  Patient Details  Name: Misty Carroll MRN: [id number] Date of Birth: [birth year]  Today's Date: 07/04/2018 OT Individual Time: 1829-9371 OT Individual Time Calculation (min): 47 min    Short Term Goals: Week 3:  OT Short Term Goal 1 (Week 3): Cont to work towards LTG of supervision overall  Skilled Therapeutic Interventions/Progress Updates:    Session focused on sequencing and cognitive remediation with bathing and dressing tasks. Pt's daughter requested that pt take shower. Pt completed functional mobility into shower with (S), moderate cueing for RW management. Pt doffed all clothing with heavy cueing for each piece. Pt completed all bathing with (S). Pt resistant to wash hair and unable to follow commands to complete. Pt donned all clothing with (S) sit <> stand. Multiple cues throughout for pt to use RW. Pt was left sitting in recliner with telesitter on and daughter present. No c/o pain throughout session.   Therapy Documentation Precautions:  Precautions Precautions: Fall Restrictions Weight Bearing Restrictions: Yes LLE Weight Bearing: Weight bearing as tolerated   Vital Signs: Therapy Vitals Temp: 98.1 F (36.7 C) Temp Source: Oral Pulse Rate: 80 Resp: 18 BP: (!) 155/68 Oxygen Therapy SpO2: 97 % O2 Device: Room Air   Therapy/Group: Individual Therapy  Curtis Sites 07/04/2018, 7:23 AM

## 2018-07-04 NOTE — Progress Notes (Signed)
Physical Therapy Session Note  Patient Details  Name: Misty Carroll MRN: [id number] Date of Birth: 18-Nov-1932  Today's Date: 07/04/2018 PT Individual Time: 1015-1107 PT Individual Time Calculation (min): 52 min   Short Term Goals: Week 3:  PT Short Term Goal 1 (Week 3): =LTG due to estimated LOS  Skilled Therapeutic Interventions/Progress Updates:   Pt received toileting w/ daughter, agreeable to therapy and no c/o pain. Ambulated around room w/ RW and supervision, washed hands at sink w/ verbal and visual cues for sequencing and technique. Ambulated to/from day room w/ supervision using RW, verbal slow gait speed, >150' each way. Refused to use RW on way back towards room. NuStep 10 min @ level 3 for global strengthening and endurance training, manual cues for initiation at first and verbal reminders throughout to increase speed. Ambulated in household environment, weaving around cones w/o AD. CGA for safety as pt w/ increased scissoring when weaving, verbal cues for wide turns. Returned to room and ended session in recliner and care of daughter, all needs met. Ice applied to L lateral hip for pain relief, increased c/o L hip pain after prolonged gait   Therapy Documentation Precautions:  Precautions Precautions: Fall Restrictions Weight Bearing Restrictions: Yes LLE Weight Bearing: Weight bearing as tolerated Vital Signs: Therapy Vitals BP: (!) 152/70  Therapy/Group: Individual Therapy  [name] [name] 07/04/2018, 11:07 AM

## 2018-07-04 NOTE — Progress Notes (Signed)
Physical Therapy Session Note  Patient Details  Name: Misty Carroll MRN: [id number] Date of Birth: January 31, 1933  Today's Date: 07/04/2018 PT Individual Time: 0810-0900 and 1430-1500 PT Individual Time Calculation (min): 50 min and 30 min (total 80 min)   Short Term Goals: Week 2:  PT Short Term Goal 1 (Week 2): =LTG due to estimated LOS  Skilled Therapeutic Interventions/Progress Updates: Tx 1: Pt received seated in recliner eating breakfast with daughter present; allowed pt to finish eating prior to initiation of session. Upon therapists return, pt agreeable to treatment. Ambulation with RW within room with daughter providing S to perform hygiene at sink in standing. Reminders to maintain RW in front of body when attempting to ambulate back to recliner after finishing brushing teeth. Gait within unit with RW and min guard/close S 2x150'; demo's increased gait speed with light pressure to low back by therapist. Max cues for path finding and command following for directions. Gait on ramp, mulch with RW and min guard, min guard with mild LOB ambulating up/down curb step with RW. Heel/toe walking on ramp for strengthening and coordination; pt with difficulty coordinating movement. Standing balance/gastroc stretch on foam wedge while performing UE reaching for balance; pt uncomfortable on wedge, reports feels like shes falling and unwilling to continue attempts. Standing balance on level floor no UE support with UE reaching overhead to retrieve horseshoes and throw to target; min guard for balance. Side stepping R/L with hall rail; max tactile cues to prevent pelvic rotation/forward steps. Gait to return to room as above. Remained seated in recliner at end of session, daughter present and all needs in reach.   Tx 2: Pt received seated in recliner, denies pain and agreeable to treatment. Gait no AD with min guard x175'; one moderate LOB when changing directions as pt exited room into hallway; minA to recover  balance. Ascent/descent 3" steps x24 steps total with S. Sitting/standing peg board puzzles with min/mod cues for correct setup and attention to task. W/c propulsion x75' with BUE and S for strengthening and aerobic endurance, min cues for technique. Stand pivot w/c >recliner with S. Remained seated in recliner at end of session, all needs in reach.      Therapy Documentation Precautions:  Precautions Precautions: Fall Restrictions Weight Bearing Restrictions: Yes LLE Weight Bearing: Weight bearing as tolerated General: PT Amount of Missed Time (min): 10 Minutes PT Missed Treatment Reason: Other (Comment)(breakfast)    Therapy/Group: Individual Therapy  Corliss Skains 07/04/2018, 8:59 AM

## 2018-07-04 NOTE — Progress Notes (Addendum)
Social Work Patient ID: [name], female   DOB: 05-03-1933, 84 y.o.   MRN: [id number] [name] and the family chose [facility] and the plan is to transfer Thursday. Family plans on taking her, will touch base with [facility] on Thursday. Family to talk with pt tomorrow regarding this plan.

## 2018-07-04 NOTE — Progress Notes (Signed)
Speech Language Pathology Weekly Progress and Session Note  Patient Details  Name: Misty Carroll MRN: [id number] Date of Birth: 05/08/1933  Beginning of progress report period: June 15, 2018 End of progress report period: July 04, 2018  Today's Date: 07/04/2018 SLP Individual Time:  -     Short Term Goals: Week 2: SLP Short Term Goal 1 (Week 2): Length of stay now longer than originally anticipated due to medical issues with decline in function, continue working towards LTG SLP Short Term Goal 1 - Progress (Week 2): Progressing toward goal    New Short Term Goals: Week 3: SLP Short Term Goal 1 (Week 3): Length of stay now longer than orginally anticipated due to medical issues with decline in function, continue working towrds LTG  Weekly Progress Updates: Pt has made limited gains due to decline in function since initial evaluation and medical issues. Pt is currently being treated for UTI and Pt demonstrated some progress in cognitive-lingustic skills, however continues to require total-max A at this time due to deficits. Pt and family education is ongoing.  Pt's intensity of services has been reduced to 1 to 3 days due to limited progress and awaiting SNF placement. Pt would continue to benefit from skilled ST while inpatient in order to maximize functional independence and reduce burden of care prior to discharge.  Anticipate that pt will need 24/7 supervision at discharge in addition to [facility] follow up at next level of care.        Intensity: Minumum of 1-2 x/day, 30 to 90 minutes Frequency: 1 to 3 out of 7 days Duration/Length of Stay: awaiting SNF placement Treatment/Interventions: Cognitive remediation/compensation;Cueing hierarchy;Functional tasks;Environmental controls;Internal/external aids;Speech/Language facilitation;Patient/family education;Therapeutic Activities     General    Pain    Therapy/Group: Individual Therapy  Jesly Hartmann  [facility] 07/04/2018, 1:58  PM

## 2018-07-04 NOTE — Progress Notes (Signed)
[place] PHYSICAL MEDICINE & REHABILITATION PROGRESS NOTE   Subjective/Complaints: Patient seen ambulating with daughter this morning.  No reported issues overnight.  ROS: Limited due to cognition.  Objective:   No results found. Recent Labs    07/03/18 0923  WBC 3.6*  HGB 12.1  HCT 37.5  PLT 243   Recent Labs    07/03/18 0923  NA 136  K 3.1*  CL 95*  CO2 28  GLUCOSE 173*  BUN 16  CREATININE 0.80  CALCIUM 8.9    Intake/Output Summary (Last 24 hours) at 07/04/2018 0824 Last data filed at 07/03/2018 1834 Gross per 24 hour  Intake 582 ml  Output -  Net 582 ml     Physical Exam: Vital Signs Blood pressure (!) 152/70, pulse 80, temperature 98.1 F (36.7 C), temperature source Oral, resp. rate 18, height 5\' 5"  (1.651 m), weight 54.5 kg, SpO2 97 %. Constitutional: No distress . Vital signs reviewed. HENT: Normocephalic.  Atraumatic. Eyes: EOMI. No discharge. Cardiovascular: Irregularly irregular.  No JVD. Respiratory: CTA bilaterally.  Normal effort. GI: BS +. Non-distended. Musc: No edema or tenderness in extremities. Neurological: Alert. Global aphasia HOH Delays in processing, unchanged.   Motor: Bilateral UE 4+/5 proximal to distal, when willing to participate, stable RLE: 4/5 proximal to distal, when willing to participate, stable LLE: 4/5 prox to distal, when willing to participate, unchanged Skin: Skin iswarm. Psychiatric: Confused, tangential, and disoriented  Assessment/Plan: 1. Functional deficits secondary to TBI, left femoral neck fracture which require 3+ hours per day of interdisciplinary therapy in a comprehensive inpatient rehab setting.  Physiatrist is providing close team supervision and 24 hour management of active medical problems listed below.  Physiatrist and rehab team continue to assess barriers to discharge/monitor patient progress toward functional and medical goals  Care Tool:  Bathing    Body parts bathed by patient:  Right arm, Left arm, Chest, Abdomen, Front perineal area, Buttocks, Right upper leg, Left upper leg, Right lower leg, Left lower leg, Face         Bathing assist Assist Level: Supervision/Verbal cueing     Upper Body Dressing/Undressing Upper body dressing   What is the patient wearing?: Pull over shirt, Bra    Upper body assist Assist Level: Supervision/Verbal cueing    Lower Body Dressing/Undressing Lower body dressing      What is the patient wearing?: Underwear/pull up, Pants     Lower body assist Assist for lower body dressing: Supervision/Verbal cueing     Toileting Toileting Toileting Activity did not occur (Clothing management and hygiene only): Safety/medical concerns  Toileting assist Assist for toileting: Supervision/Verbal cueing Assistive Device Comment: BSC   Transfers Chair/bed transfer  Transfers assist  Chair/bed transfer activity did not occur: Safety/medical concerns  Chair/bed transfer assist level: Supervision/Verbal cueing     Locomotion Ambulation   Ambulation assist      Assist level: Contact Guard/Touching assist Assistive device: Walker-rolling Max distance: 272ft   Walk 10 feet activity   Assist     Assist level: Contact Guard/Touching assist Assistive device: Walker-rolling   Walk 50 feet activity   Assist    Assist level: Contact Guard/Touching assist Assistive device: Walker-rolling    Walk 150 feet activity   Assist Walk 150 feet activity did not occur: Safety/medical concerns  Assist level: Contact Guard/Touching assist Assistive device: Walker-rolling    Walk 10 feet on uneven surface  activity   Assist Walk 10 feet on uneven surfaces activity did not occur: Safety/medical concerns  Assist level: Supervision/Verbal cueing     Wheelchair     Assist Will patient use wheelchair at discharge?: No   Wheelchair activity did not occur: N/A         Wheelchair 50 feet with 2 turns  activity    Assist    Wheelchair 50 feet with 2 turns activity did not occur: N/A       Wheelchair 150 feet activity     Assist Wheelchair 150 feet activity did not occur: N/A         Medical Problem List and Plan: 1.Decreased functional mobilitysecondary to traumatic SAH/TBI and left femoral neck fracture. Status post ORIF 06/13/2018.  Continue CIR  DC to SNF pending  Follow-up cranial CT scan 12/18 reviewed, stable.   Weightbearing as tolerated 2. DVT Prophylaxis/Anticoagulation: SCDs. Dopplers limited, but no DVT noted 3. Pain Management:Robaxin as needed 4. Mood:Aricept 5 mg daily, Risperdal 1 mg daily  Continue tele-sitter 5. Neuropsych: This patientis not capable of making decisions on herown behalf. 6. Skin/Wound Care:Routine skin checks 7. Fluids/Electrolytes/Nutrition:Routine in and out's  Inconsistent nutritional intake on 12/31 8. Seizure prophylaxis. Keppra 500 mg twice a day 9. Hypothyroidism. Synthroid 10. Constipation. Laxative assistance 11.  Labile blood pressure  Orthostatics remain pending on 12/31  Lisinopril 2.5 started on 12/25  ?  Trending up on 12/31  Continue to monitor 12.  Hypokalemia:   Potassium 3.1 on 12/30  Supplement initiated on 12/31  Magnesium 1.7 on 12/24 - supplement initiated  Will order labs for the end of the week  Continue to monitor 13.  Acute blood loss anemia: Resolved  Continue to monitor 14.  Hypoalbuminemia  Supplement initiated on 12/16 15.  Sleep disturbance  EKG on 12/11 reviewed, trazodone started on 12/17, however not given, changed to as needed  May try tramadol at night as patient used to get good relief of pain and sleep with this medication 16.  Hyponatremia  Sodium 136 on 12/30  Continue to monitor 17.  Nonresponsiveness: Resolved  CT stable, EEG negative, labs relatively unremarkable  Suspect related to hydration status and orthostasis  Teds/abdominal binder as needed 18.   Hyperglycemia  Elevated on 12/30 19.  Abnormal urinalysis  Urine culture with multiple species  Continue empiric Rocephin started 12/27 20.  Leukopenia  WBC 3.6 on 12/30  Continue to monitor   LOS: 18 days A FACE TO FACE EVALUATION WAS PERFORMED  Alen Matheson Lorie Phenix 07/04/2018, 8:24 AM

## 2018-07-05 ENCOUNTER — Inpatient Hospital Stay (HOSPITAL_COMMUNITY): Payer: Medicare Other | Admitting: Physical Therapy

## 2018-07-05 ENCOUNTER — Inpatient Hospital Stay (HOSPITAL_COMMUNITY): Payer: Medicare Other | Admitting: Occupational Therapy

## 2018-07-05 ENCOUNTER — Inpatient Hospital Stay (HOSPITAL_COMMUNITY): Payer: Medicare Other | Admitting: Speech Pathology

## 2018-07-05 ENCOUNTER — Inpatient Hospital Stay (HOSPITAL_COMMUNITY): Payer: Medicare Other

## 2018-07-05 NOTE — Progress Notes (Signed)
Occupational Therapy Session Note  Patient Details  Name: Misty Carroll MRN: [id number] Date of Birth: 1933/03/31  Today's Date: 07/05/2018 OT Individual Time: 0254-2706 OT Individual Time Calculation (min): 69 min    Short Term Goals: Week 3:  OT Short Term Goal 1 (Week 3): Cont to work towards LTG of supervision overall  Skilled Therapeutic Interventions/Progress Updates:    Pt seen for OT ADL bathing/dressing session. Pt sitting up in recliner upon arrival with daughter present though she did not stay for session. Pt pleasantly confused during session, unable to answer any orientation questions, however, following commands appropriately from therapist. She ambulated throughout session with RW and close supervision, VCs for path finding and recalling purpose of task. When doffing clothes in prep for shower, required VCs to remove each article of clothing and reasoning for removal.  She bathed seated on tub bench, VCs for sequencing including placing soap on washcloth prior to bathing. She stood with use of grab bar to compete pericare/buttock hygiene with multi-cmoda cuing to attend to all areas. She ambulated out of bathroom and dressed seated in standard chair. UB dressing completed with set-up. LB dressing with supervision for sit>stand and min A to advance L shoe over heel and to tie. She completed grooming tasks standing at sink with supervision, able to maintain dynamic standing balance without UE support. She required VCs to locate all needs items in prep for task. Pt returned to recliner at end of session, left seated set-up with meal tray and daughter present.   Therapy Documentation Precautions:  Precautions Precautions: Fall Restrictions Weight Bearing Restrictions: Yes LLE Weight Bearing: Weight bearing as tolerated Pain:   No/denies pain   Therapy/Group: Individual Therapy  Macen Joslin L 07/05/2018, 6:45 AM

## 2018-07-05 NOTE — Progress Notes (Addendum)
Occupational Therapy Discharge Summary  Patient Details  Name: Misty Carroll MRN: [id number] Date of Birth: January 14, 1933   Patient has met 9 of 10 long term goals due to improved activity tolerance, improved balance, postural control, improved attention, improved awareness and improved coordination.  Patient to discharge at overall Supervision level.  Patient's children are unable to provide the very close supervision and max cognitive assist pt requires at d/c. Therefore, pt to d/c to SNF in order to cont to increase independence with ADLs and decrease caregiver burden.   Reasons goals not met: Pt requires min A for LB dressing to don socks/shoes on L LE due to limited ROM  Recommendation:  Patient will benefit from ongoing skilled OT services in skilled nursing facility setting to continue to advance functional skills in the area of BADL and Reduce care partner burden.  Equipment: TBD at next venue of care  Reasons for discharge: treatment goals met and discharge from hospital  Patient/family agrees with progress made and goals achieved: Yes  OT Discharge Precautions/Restrictions  Precautions Precautions: Fall Restrictions Weight Bearing Restrictions: Yes LLE Weight Bearing: Weight bearing as tolerated Vision Baseline Vision/History: Wears glasses Wears Glasses: At all times Patient Visual Report: No change from baseline;Other (comment)(Difficult to assess 2/2 cognition) Vision Assessment?: No apparent visual deficits Additional Comments: Unable to formally assess; grossly [facility] for mobility tasks Perception  Perception: Within Functional Limits Praxis Praxis: Impaired Praxis Impairment Details: Motor planning;Initiation Cognition Overall Cognitive Status: History of cognitive impairments - at baseline Arousal/Alertness: Awake/alert Orientation Level: Oriented to person Sustained Attention: Impaired Sustained Attention Impairment: Verbal basic;Functional basic Selective  Attention: Impaired Memory: Impaired Decreased Short Term Memory: Verbal basic;Functional basic Awareness Impairment: Intellectual impairment Problem Solving: Impaired Problem Solving Impairment: Verbal basic;Functional basic Safety/Judgment: Impaired Comments: decreased awareness of deficits and functional implications Sensation Sensation Light Touch: Appears Intact Proprioception: Appears Intact Coordination Gross Motor Movements are Fluid and Coordinated: No Fine Motor Movements are Fluid and Coordinated: Yes Coordination and Movement Description: Impaired 2/2 generalized weakness Heel Shin Test: decreased speed/excursion Motor  Motor Motor: Within Functional Limits Motor - Discharge Observations: generalized weakness Mobility  Bed Mobility Bed Mobility: Sit to Supine;Supine to Sit Rolling Right: Supervision/verbal cueing Rolling Left: Supervision/Verbal cueing Supine to Sit: Supervision/Verbal cueing Sit to Supine: Supervision/Verbal cueing Transfers Sit to Stand: Supervision/Verbal cueing Stand to Sit: Supervision/Verbal cueing  Trunk/Postural Assessment  Cervical Assessment Cervical Assessment: Within Functional Limits Thoracic Assessment Thoracic Assessment: Within Functional Limits Lumbar Assessment Lumbar Assessment: Within Functional Limits Postural Control Postural Control: Deficits on evaluation(Delayed)  Balance Balance Balance Assessed: Yes Static Sitting Balance Static Sitting - Balance Support: No upper extremity supported;Feet supported Static Sitting - Level of Assistance: 6: Modified independent (Device/Increase time) Dynamic Sitting Balance Dynamic Sitting - Balance Support: No upper extremity supported;Feet supported Dynamic Sitting - Level of Assistance: 6: Modified independent (Device/Increase time);5: Stand by assistance Sitting balance - Comments: Sitting to complete bathing/dressing tasks Static Standing Balance Static Standing - Balance  Support: During functional activity Static Standing - Level of Assistance: 5: Stand by assistance Dynamic Standing Balance Dynamic Standing - Balance Support: During functional activity;Right upper extremity supported;Left upper extremity supported Dynamic Standing - Level of Assistance: 5: Stand by assistance Dynamic Standing - Comments: Standing to complete grooming tasks at sink Extremity/Trunk Assessment RUE Assessment RUE Assessment: Within Functional Limits General Strength Comments: Not formaly assessed 2/2 cognition, able to use in functional manner, however, demonstrates generalized weakness LUE Assessment LUE Assessment: Within Functional Limits General Strength Comments: Not formaly  assessed 2/2 cognition, able to use in functional manner, however, demonstrates generalized weakness   Misty Carroll L 07/05/2018, 12:37 PM

## 2018-07-05 NOTE — Progress Notes (Signed)
Physical Therapy Discharge Summary  Patient Details  Name: Misty Carroll MRN: [id number] Date of Birth: 1932/09/16  Today's Date: 07/05/2018 PT Individual Time: 0900-0930 PT Individual Time Calculation (min): 30 min    Patient has met 7 of 9 long term goals due to improved activity tolerance, improved balance, improved postural control, increased strength, decreased pain and ability to compensate for deficits.  Patient to discharge at an ambulatory level Supervision.   Patient's care partner is unable to provide the necessary 24/7 physical and cognitive assistance at discharge; pt to d/c to SNF for continued skilled services.  Reasons goals not met: Requires S for bed mobility, totalA for memory/recall  Recommendation:  Patient will benefit from ongoing skilled PT services in skilled nursing facility setting to continue to advance safe functional mobility, address ongoing impairments in balance, strength, activity tolerance, and minimize fall risk.  Equipment: RW  Reasons for discharge: treatment goals met and discharge from hospital  Patient/family agrees with progress made and goals achieved: Yes  PT Discharge Precautions/Restrictions Precautions Precautions: Fall Restrictions Weight Bearing Restrictions: Yes LLE Weight Bearing: Weight bearing as tolerated Pain Pain Assessment Pain Scale: 0-10 Pain Score: 0-No pain Vision/Perception  Vision - Assessment Additional Comments: Unable to formally assess; grossly WFL for mobility tasks Praxis Praxis: Impaired Praxis Impairment Details: Motor planning;Initiation  Cognition Overall Cognitive Status: History of cognitive impairments - at baseline Arousal/Alertness: Awake/alert Orientation Level: Oriented to person Sustained Attention: Impaired Selective Attention: Impaired Memory: Impaired Safety/Judgment: Impaired Sensation Sensation Light Touch: Appears Intact Proprioception: Appears Intact Coordination Gross Motor  Movements are Fluid and Coordinated: No Heel Shin Test: decreased speed/excursion Motor  Motor Motor - Discharge Observations: generalized weakness  Mobility Bed Mobility Bed Mobility: Sit to Supine;Supine to Sit Rolling Right: Supervision/verbal cueing Rolling Left: Supervision/Verbal cueing Supine to Sit: Supervision/Verbal cueing Sit to Supine: Supervision/Verbal cueing Transfers Transfers: Sit to Stand;Stand Pivot Transfers Sit to Stand: Supervision/Verbal cueing Stand to Sit: Supervision/Verbal cueing Stand Pivot Transfers: Supervision/Verbal cueing Transfer (Assistive device): Rolling walker Locomotion  Gait Ambulation: Yes Gait Assistance: Supervision/Verbal cueing Gait Distance (Feet): 300 Feet Assistive device: Rolling walker Gait Assistance Details: Verbal cues for precautions/safety;Verbal cues for safe use of DME/AE Gait Gait: Yes Gait Pattern: Impaired Gait Pattern: Decreased stance time - left;Decreased stride length;Left flexed knee in stance;Right flexed knee in stance;Poor foot clearance - right;Poor foot clearance - left Gait velocity: significantly decreased Stairs / Additional Locomotion Stairs: Yes Stairs Assistance: Supervision/Verbal cueing Stair Management Technique: Two rails;Alternating pattern Number of Stairs: 12 Height of Stairs: 6 Ramp: Supervision/Verbal cueing Curb: Contact Guard/Touching assist Wheelchair Mobility Wheelchair Mobility: No  Trunk/Postural Assessment  Cervical Assessment Cervical Assessment: Within Functional Limits Thoracic Assessment Thoracic Assessment: Within Functional Limits Lumbar Assessment Lumbar Assessment: Within Functional Limits Postural Control Postural Control: Deficits on evaluation(Delayed)  Balance Balance Balance Assessed: Yes Static Sitting Balance Static Sitting - Balance Support: No upper extremity supported;Feet supported Static Sitting - Level of Assistance: 6: Modified independent  (Device/Increase time) Dynamic Sitting Balance Dynamic Sitting - Balance Support: No upper extremity supported;Feet supported Dynamic Sitting - Level of Assistance: 6: Modified independent (Device/Increase time) Static Standing Balance Static Standing - Balance Support: During functional activity;Right upper extremity supported;Left upper extremity supported Static Standing - Level of Assistance: 5: Stand by assistance Dynamic Standing Balance Dynamic Standing - Balance Support: During functional activity;Right upper extremity supported;Left upper extremity supported Dynamic Standing - Level of Assistance: 5: Stand by assistance Extremity Assessment  RUE Assessment RUE Assessment: Within Functional Limits General Strength Comments:  Not formaly assessed 2/2 cognition, able to use in functional manner, however, demonstrates generalized weakness LUE Assessment LUE Assessment: Within Functional Limits General Strength Comments: Not formaly assessed 2/2 cognition, able to use in functional manner, however, demonstrates generalized weakness RLE Assessment RLE Assessment: Exceptions to [facility] Passive Range of Motion (PROM) Comments: [facility] General Strength Comments: 4/5 globally LLE Assessment LLE Assessment: Exceptions to [facility] Passive Range of Motion (PROM) Comments: WFL General Strength Comments: 4- to 4/5 globally   Skilled Therapeutic Intervention: Pt asleep in bed with daughter present who requested pt be allowed to continue sleeping, missed 30 min PT; therapist returned an hour later with pt awake and agreeable to participate. Assessed mobility as above with S to min guard overall; pt utilized RW d/t L hip pain upon standing. Pt and daughter with no further questions regarding d/c to SNF; handoff to SLP at end of session.    Benjiman Core Mellony Danziger 07/05/2018, 10:01 AM

## 2018-07-05 NOTE — Progress Notes (Signed)
[facility] PHYSICAL MEDICINE & REHABILITATION PROGRESS NOTE   Subjective/Complaints: Patient seen sitting up in her chair this morning.  Daughter at bedside.  No reported issues overnight.  ROS: Limited due to cognition.  Objective:   No results found. Recent Labs    07/03/18 0923  WBC 3.6*  HGB 12.1  HCT 37.5  PLT 243   Recent Labs    07/03/18 0923  NA 136  K 3.1*  CL 95*  CO2 28  GLUCOSE 173*  BUN 16  CREATININE 0.80  CALCIUM 8.9    Intake/Output Summary (Last 24 hours) at 07/05/2018 0943 Last data filed at 07/04/2018 1300 Gross per 24 hour  Intake 222 ml  Output -  Net 222 ml     Physical Exam: Vital Signs Blood pressure (!) 144/80, pulse 77, temperature 97.6 F (36.4 C), temperature source Oral, resp. rate 16, height 5\' 5"  (1.651 m), weight 54.5 kg, SpO2 96 %. Constitutional: No distress . Vital signs reviewed. HENT: Normocephalic.  Atraumatic. Eyes: EOMI. No discharge. Cardiovascular: Irregularly irregular.  No JVD. Respiratory: CTA bilaterally.  Normal effort. GI: BS +. Non-distended. Musc: No edema or tenderness in extremities. Neurological: Alert. Global aphasia HOH Delays in processing, unchanged.   Motor: Bilateral UE 4+/5 proximal to distal, when willing to participate, unchanged RLE: 4/5 proximal to distal, when willing to participate, unchanged LLE: 4/5 prox to distal, when willing to participate, unchanged Skin: Skin iswarm. Psychiatric: Confused, tangential, and disoriented  Assessment/Plan: 1. Functional deficits secondary to TBI, left femoral neck fracture which require 3+ hours per day of interdisciplinary therapy in a comprehensive inpatient rehab setting.  Physiatrist is providing close team supervision and 24 hour management of active medical problems listed below.  Physiatrist and rehab team continue to assess barriers to discharge/monitor patient progress toward functional and medical goals  Care Tool:  Bathing    Body  parts bathed by patient: Right arm, Left arm, Chest, Abdomen, Front perineal area, Buttocks, Right upper leg, Left upper leg, Right lower leg, Left lower leg, Face         Bathing assist Assist Level: Supervision/Verbal cueing     Upper Body Dressing/Undressing Upper body dressing   What is the patient wearing?: Pull over shirt, Bra    Upper body assist Assist Level: Supervision/Verbal cueing    Lower Body Dressing/Undressing Lower body dressing      What is the patient wearing?: Underwear/pull up, Pants     Lower body assist Assist for lower body dressing: Supervision/Verbal cueing     Toileting Toileting Toileting Activity did not occur (Clothing management and hygiene only): Safety/medical concerns  Toileting assist Assist for toileting: Supervision/Verbal cueing Assistive Device Comment: BSC   Transfers Chair/bed transfer  Transfers assist  Chair/bed transfer activity did not occur: Safety/medical concerns  Chair/bed transfer assist level: Supervision/Verbal cueing     Locomotion Ambulation   Ambulation assist      Assist level: Supervision/Verbal cueing Assistive device: Walker-rolling Max distance: 150'   Walk 10 feet activity   Assist     Assist level: Supervision/Verbal cueing Assistive device: Walker-rolling   Walk 50 feet activity   Assist    Assist level: Supervision/Verbal cueing Assistive device: Walker-rolling    Walk 150 feet activity   Assist Walk 150 feet activity did not occur: Safety/medical concerns  Assist level: Supervision/Verbal cueing Assistive device: Walker-rolling    Walk 10 feet on uneven surface  activity   Assist Walk 10 feet on uneven surfaces activity did not occur:  Safety/medical concerns   Assist level: Supervision/Verbal cueing Assistive device: Aeronautical engineer Will patient use wheelchair at discharge?: No   Wheelchair activity did not occur: N/A          Wheelchair 50 feet with 2 turns activity    Assist    Wheelchair 50 feet with 2 turns activity did not occur: N/A       Wheelchair 150 feet activity     Assist Wheelchair 150 feet activity did not occur: N/A         Medical Problem List and Plan: 1.Decreased functional mobilitysecondary to traumatic SAH/TBI and left femoral neck fracture. Status post ORIF 06/13/2018.  Continue CIR  DC to SNF remains pending  Follow-up cranial CT scan 12/18 reviewed, stable.   Weightbearing as tolerated 2. DVT Prophylaxis/Anticoagulation: SCDs. Dopplers limited, but no DVT noted 3. Pain Management:Robaxin as needed 4. Mood:Aricept 5 mg daily, Risperdal 1 mg daily  Continue tele-sitter 5. Neuropsych: This patientis not capable of making decisions on herown behalf. 6. Skin/Wound Care:Routine skin checks 7. Fluids/Electrolytes/Nutrition:Routine in and out's  Inconsistent nutritional intake on 1/1 8. Seizure prophylaxis. Keppra 500 mg twice a day 9. Hypothyroidism. Synthroid 10. Constipation. Laxative assistance 11.  Labile blood pressure  Orthostatics remain pending on 12/31, likely positive  Lisinopril 2.5 started on 12/25  Labile on 1/1  Abdominal binder/teds as needed  Continue to monitor 12.  Hypokalemia:   Potassium 3.1 on 12/30  Supplement initiated on 12/31  Magnesium 1.7 on 12/24 - supplement initiated  Will order labs for the end of this week  Continue to monitor 13.  Acute blood loss anemia: Resolved  Continue to monitor 14.  Hypoalbuminemia  Supplement initiated on 12/16 15.  Sleep disturbance  EKG on 12/11 reviewed, trazodone started on 12/17, however not given, changed to as needed  May try tramadol at night as patient used to get good relief of pain and sleep with this medication 16.  Hyponatremia  Sodium 136 on 12/30  Continue to monitor 17.  Nonresponsiveness: Resolved  CT stable, EEG negative, labs relatively unremarkable  Suspect related  to hydration status and orthostasis  Teds/abdominal binder as needed 18.  Hyperglycemia  Elevated on 12/30 19.  Abnormal urinalysis  Urine culture with multiple species  Empiric Rocephin completed 20.  Leukopenia  WBC 3.6 on 12/30  Continue to monitor   LOS: 19 days A FACE TO [place] 07/05/2018, 9:43 AM

## 2018-07-06 NOTE — Progress Notes (Signed)
Brief note: Please also see discharge summary.  Patient will need outpatient labs in 1 to 2 days to monitor for improvement in potassium.  Patient stable for discharge to SNF.  We will see patient in 1 month for hospital follow-up.

## 2018-07-06 NOTE — Progress Notes (Signed)
Report was call to [facility]. Is ready to go to facility with her family.

## 2018-07-06 NOTE — Plan of Care (Signed)
  Problem: Spiritual Needs Goal: Ability to function at adequate level Outcome: Progressing   Problem: Consults Goal: RH BRAIN INJURY PATIENT EDUCATION Description Description: See Patient Education module for eduction specifics Outcome: Progressing Goal: Skin Care Protocol Initiated - if Braden Score 18 or less Description If consults are not indicated, leave blank or document N/A Outcome: Progressing   Problem: RH BOWEL ELIMINATION Goal: RH STG MANAGE BOWEL WITH ASSISTANCE Description STG Manage Bowel with mod Assistance.  Outcome: Progressing Goal: RH STG MANAGE BOWEL W/MEDICATION W/ASSISTANCE Description STG Manage Bowel with Medication with mod Assistance.  Outcome: Progressing   Problem: RH SKIN INTEGRITY Goal: RH STG SKIN FREE OF INFECTION/BREAKDOWN Description Patients skin will remain free from further infection or breakdown with mod assist.  Outcome: Progressing Goal: RH STG MAINTAIN SKIN INTEGRITY WITH ASSISTANCE Description STG Maintain Skin Integrity With mod Assistance.  Outcome: Progressing   Problem: RH SAFETY Goal: RH STG ADHERE TO SAFETY PRECAUTIONS W/ASSISTANCE/DEVICE Description STG Adhere to Safety Precautions With mod Assistance/Device.  Outcome: Progressing Goal: RH STG DECREASED RISK OF FALL WITH ASSISTANCE Description STG Decreased Risk of Fall With mod Assistance.  Outcome: Progressing   Problem: RH COGNITION-NURSING Goal: RH STG USES MEMORY AIDS/STRATEGIES W/ASSIST TO PROBLEM SOLVE Description STG Uses Memory Aids/Strategies With mod Assistance to Problem Solve.  Outcome: Progressing Goal: RH STG ANTICIPATES NEEDS/CALLS FOR ASSIST W/ASSIST/CUES Description STG Anticipates Needs/Calls for Assist With mod Assistance/Cues.  Outcome: Progressing   Problem: RH PAIN MANAGEMENT Goal: RH STG PAIN MANAGED AT OR BELOW PT'S PAIN GOAL Description < 3  Outcome: Progressing

## 2018-07-06 NOTE — Progress Notes (Signed)
Social Work  Discharge Note  The overall goal for the admission was met for:   Discharge location: Yes-[facility]  Length of Stay: No-18 DAYS  Discharge activity level: Yes-SUPERVISION-MIN ASSIST LEVEL  Home/community participation: Yes  Services provided included: MD, RD, PT, OT, SLP, RN, CM, Pharmacy and SW  Financial Services: Medicare  Follow-up services arranged: Other: SHORT TERM NHP  Comments (or additional information):NEEDS MORE REHAB THAN WILL PURSUE [facility]. CHILDREN VERY INVOLVED AND SUPPORTIVE.   Patient/Family verbalized understanding of follow-up arrangements: Yes  Individual responsible for coordination of the follow-up plan: JULIANA & JANET-DAUGHTER'S  Confirmed correct DME delivered: Elease Hashimoto 07/06/2018    Elease Hashimoto

## 2018-07-07 ENCOUNTER — Encounter: Payer: Self-pay | Admitting: Physical Medicine & Rehabilitation

## 2018-07-07 NOTE — Progress Notes (Signed)
Speech Language Pathology Discharge Summary  Patient Details  Name: Misty Carroll MRN: [id number] Date of Birth: 28-Aug-1932    Patient has met 1 of 7 long term goals.  Patient to discharge at [facility] Max;Total level.  Reasons goals not met: decline in function from initial evaluation with inconsistent progress towards goals despite downgrading goals   Clinical Impression/Discharge Summary:  Pt has made no functional gains while inpatient and is discharging having met 1 out of 7 short term goals.  Pt is currently max to total assist for tasks due to severe cognitive deficits.   Pt/family education is complete at this time.  Family elected to have pt d/c to SNF due to increased burden of care.  RecommendST follow up at next level of care.  Care Partner:  Caregiver Able to Provide Assistance: Other (comment)(SNF)  Type of Caregiver Assistance: (SNF)  Recommendation:  Skilled Nursing facility  Rationale for SLP Follow Up: Maximize functional communication;Maximize cognitive function and independence;Reduce caregiver burden   Equipment: none recommended by SLP    Reasons for discharge: Discharged from hospital   Patient/Family Agrees with Progress Made and Goals Achieved: Yes    Pheonix Clinkscale, Selinda Orion 07/07/2018, 4:48 PM

## 2018-08-08 ENCOUNTER — Ambulatory Visit: Payer: Medicare Other

## 2018-08-09 ENCOUNTER — Inpatient Hospital Stay: Payer: Medicare Other | Admitting: Physical Medicine & Rehabilitation

## 2018-08-28 ENCOUNTER — Ambulatory Visit (INDEPENDENT_AMBULATORY_CARE_PROVIDER_SITE_OTHER): Payer: Medicare Other | Admitting: Obstetrics & Gynecology

## 2018-08-28 ENCOUNTER — Encounter: Payer: Self-pay | Admitting: Obstetrics & Gynecology

## 2018-08-28 VITALS — BP 100/60 | Ht 65.5 in | Wt 120.0 lb

## 2018-08-28 DIAGNOSIS — N812 Incomplete uterovaginal prolapse: Secondary | ICD-10-CM

## 2018-08-28 DIAGNOSIS — N814 Uterovaginal prolapse, unspecified: Secondary | ICD-10-CM | POA: Diagnosis not present

## 2018-08-28 NOTE — Progress Notes (Signed)
  HPI:      [name] is a 85 y.o. [id number] who presents today for her pessary follow up and examination related to her pelvic floor weakening.  Pt reports tolerating the pessary well with  no vaginal bleeding and  no vaginal discharge.  Symptoms of pelvic floor weakening have greatly improved. She is voiding and defecating without difficulty. She currently has a Ring #3 pessary. Pt had fall and hip fracture 2 mos ago.  PMHx: She  has a past medical history of Alzheimers disease ([place]), Broken hip ([place]) (06/22/2018), Cystocele with rectocele, High cholesterol, Hypertension, Hypothyroidism, Pneumonia (2012), Presence of pessary, Skin cancer, and Uterovaginal prolapse, incomplete. Also,  has a past surgical history that includes Cholecystectomy; Wrist fracture surgery; Fracture surgery; and Hip pinning, cannulated (Left, 06/13/2018)., family history includes Cancer in her brother and sister; Diabetes in her mother.,  reports that she has never smoked. She has never used smokeless tobacco. She reports that she does not drink alcohol or use drugs.  She has a current medication list which includes the following prescription(s): acetaminophen, donepezil, levetiracetam, levothyroxine, lisinopril, magnesium oxide, risperidone, and simvastatin. Also, is allergic to donepezil.  Review of Systems  All other systems reviewed and are negative.  Objective: BP 100/60   Ht 5' 5.5" (1.664 m)   Wt 120 lb (54.4 kg)   BMI 19.67 kg/m  Physical Exam Constitutional:      General: She is not in acute distress.    Appearance: She is well-developed.  Genitourinary:     Pelvic exam was performed with patient supine.     Vagina and uterus normal.     No vaginal erythema or bleeding.     No cervical motion tenderness, discharge, polyp or nabothian cyst.     Uterus is mobile.     Uterus is not enlarged.     No uterine mass detected.    Uterus is midaxial.     No right or left adnexal mass present.     Right  adnexa not tender.     Left adnexa not tender.     Genitourinary Comments: Gr 3 uterine prolapse and gr 2 cystocele No bleeding or erosions   HENT:     Head: Normocephalic and atraumatic.     Nose: Nose normal.  Abdominal:     General: There is no distension.     Palpations: Abdomen is soft.     Tenderness: There is no abdominal tenderness.  Musculoskeletal: Normal range of motion.  Neurological:     Mental Status: She is alert and oriented to person, place, and time.     Cranial Nerves: No cranial nerve deficit.  Skin:    General: Skin is warm and dry.   Pessary Care Pessary removed and cleaned.  Vagina checked - without erosions - pessary replaced.  A/P: 1. Uterovaginal prolapse, incomplete 2. Cystocele with small rectocele and uterine descent Pessary was cleaned and replaced today. Instructions given for care. Concerning symptoms to observe for are counseled to patient. Follow up scheduled for 3 months.  A total of 15 minutes were spent face-to-face with the patient during this encounter and over half of that time dealt with counseling and coordination of care.  Barnett Applebaum, MD, [facility], [facility] 08/28/2018  1:28 PM

## 2018-08-31 ENCOUNTER — Ambulatory Visit (INDEPENDENT_AMBULATORY_CARE_PROVIDER_SITE_OTHER): Payer: Medicare Other | Admitting: Neurology

## 2018-08-31 ENCOUNTER — Encounter: Payer: Self-pay | Admitting: Neurology

## 2018-08-31 VITALS — BP 136/74 | HR 79 | Ht 65.0 in | Wt 119.0 lb

## 2018-08-31 DIAGNOSIS — G301 Alzheimer's disease with late onset: Secondary | ICD-10-CM

## 2018-08-31 DIAGNOSIS — F0281 Dementia in other diseases classified elsewhere with behavioral disturbance: Secondary | ICD-10-CM | POA: Diagnosis not present

## 2018-08-31 DIAGNOSIS — I609 Nontraumatic subarachnoid hemorrhage, unspecified: Secondary | ICD-10-CM

## 2018-08-31 DIAGNOSIS — S065XAA Traumatic subdural hemorrhage with loss of consciousness status unknown, initial encounter: Secondary | ICD-10-CM

## 2018-08-31 DIAGNOSIS — S065X9A Traumatic subdural hemorrhage with loss of consciousness of unspecified duration, initial encounter: Secondary | ICD-10-CM
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
## 2018-08-31 NOTE — Patient Instructions (Signed)
I am sorry you had a fall and injuries.  Please make sure you eat well and enough. Try to stay active socially. Drink plenty of water.  We will continue with the low donepezil and the Keppra, that was started in the hospital.  We may consider low dose generic namenda next time, in about 6 months. Please follow up with Ward Givens, NP.

## 2018-11-29 ENCOUNTER — Ambulatory Visit: Payer: Medicare Other | Admitting: Obstetrics & Gynecology

## 2019-01-23 ENCOUNTER — Ambulatory Visit: Payer: Medicare Other | Admitting: Obstetrics & Gynecology

## 2019-03-22 ENCOUNTER — Ambulatory Visit: Payer: Medicare Other | Admitting: Adult Health

## 2019-04-26 ENCOUNTER — Ambulatory Visit: Payer: Medicare Other | Admitting: Family Medicine

## 2019-05-07 ENCOUNTER — Ambulatory Visit (INDEPENDENT_AMBULATORY_CARE_PROVIDER_SITE_OTHER): Payer: Self-pay | Admitting: Family Medicine

## 2019-05-07 ENCOUNTER — Encounter: Payer: Self-pay | Admitting: Family Medicine

## 2019-05-07 ENCOUNTER — Other Ambulatory Visit: Payer: Self-pay

## 2019-05-07 VITALS — BP 142/78 | HR 71 | Temp 97.9°F | Ht 66.0 in | Wt 122.4 lb

## 2019-05-07 DIAGNOSIS — G301 Alzheimer's disease with late onset: Secondary | ICD-10-CM

## 2019-05-07 DIAGNOSIS — F0281 Dementia in other diseases classified elsewhere with behavioral disturbance: Secondary | ICD-10-CM
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
## 2019-05-07 NOTE — Patient Instructions (Signed)
We will continue Aricpet 5mg  daily  We can consider Namenda in future if you feel things turn around  Follow up in 3 months    Memantine Tablets What is this medicine? MEMANTINE (MEM an teen) is used to treat dementia caused by Alzheimer's disease. This medicine may be used for other purposes; ask your health care provider or pharmacist if you have questions. COMMON BRAND NAME(S): Namenda What should I tell my health care provider before I take this medicine? They need to know if you have any of these conditions:  difficulty passing urine  kidney disease  liver disease  seizures  an unusual or allergic reaction to memantine, other medicines, foods, dyes, or preservatives  pregnant or trying to get pregnant  breast-feeding How should I use this medicine? Take this medicine by mouth with a glass of water. Follow the directions on the prescription label. You may take this medicine with or without food. Take your doses at regular intervals. Do not take your medicine more often than directed. Continue to take your medicine even if you feel better. Do not stop taking except on the advice of your doctor or health care professional. Talk to your pediatrician regarding the use of this medicine in children. Special care may be needed. Overdosage: If you think you have taken too much of this medicine contact a poison control center or emergency room at once. NOTE: This medicine is only for you. Do not share this medicine with others. What if I miss a dose? If you miss a dose, take it as soon as you can. If it is almost time for your next dose, take only that dose. Do not take double or extra doses. If you do not take your medicine for several days, contact your health care provider. Your dose may need to be changed. What may interact with this  medicine?  acetazolamide  amantadine  cimetidine  dextromethorphan  dofetilide  hydrochlorothiazide  ketamine  metformin  methazolamide  quinidine  ranitidine  sodium bicarbonate  triamterene This list may not describe all possible interactions. Give your health care provider a list of all the medicines, herbs, non-prescription drugs, or dietary supplements you use. Also tell them if you smoke, drink alcohol, or use illegal drugs. Some items may interact with your medicine. What should I watch for while using this medicine? Visit your doctor or health care professional for regular checks on your progress. Check with your doctor or health care professional if there is no improvement in your symptoms or if they get worse. You may get drowsy or dizzy. Do not drive, use machinery, or do anything that needs mental alertness until you know how this drug affects you. Do not stand or sit up quickly, especially if you are an older patient. This reduces the risk of dizzy or fainting spells. Alcohol can make you more drowsy and dizzy. Avoid alcoholic drinks. What side effects may I notice from receiving this medicine? Side effects that you should report to your doctor or health care professional as soon as possible:  allergic reactions like skin rash, itching or hives, swelling of the face, lips, or tongue  agitation or a feeling of restlessness  depressed mood  dizziness  hallucinations  redness, blistering, peeling or loosening of the skin, including inside the mouth  seizures  vomiting Side effects that usually do not require medical attention (report to your doctor or health care professional if they continue or are bothersome):  constipation  diarrhea  headache  nausea  trouble sleeping This list may not describe all possible side effects. Call your doctor for medical advice about side effects. You may report side effects to FDA at 1-800-FDA-1088. Where should I  keep my medicine? Keep out of the reach of children. Store at room temperature between 15 degrees and 30 degrees C (59 degrees and 86 degrees F). Throw away any unused medicine after the expiration date. NOTE: This sheet is a summary. It may not cover all possible information. If you have questions about this medicine, talk to your doctor, pharmacist, or health care provider.  2020 Elsevier/Gold Standard (2013-04-09 14:10:42)   Dementia Caregiver Guide Dementia is a term used to describe a number of symptoms that affect memory and thinking. The most common symptoms include:  Memory loss.  Trouble with language and communication.  Trouble concentrating.  Poor judgment.  Problems with reasoning.  Child-like behavior and language.  Extreme anxiety.  Angry outbursts.  Wandering from home or public places. Dementia usually gets worse slowly over time. In the early stages, people with dementia can stay independent and safe with some help. In later stages, they need help with daily tasks such as dressing, grooming, and using the bathroom. How to help the person with dementia cope Dementia can be frightening and confusing. Here are some tips to help the person with dementia cope with changes caused by the disease. General tips  Keep the person on track with his or her routine.  Try to identify areas where the person may need help.  Be supportive, patient, calm, and encouraging.  Gently remind the person that adjusting to changes takes time.  Help with the tasks that the person has asked for help with.  Keep the person involved in daily tasks and decisions as much as possible.  Encourage conversation, but try not to get frustrated or harried if the person struggles to find words or does not seem to appreciate your help. Communication tips  When the person is talking or seems frustrated, make eye contact and hold the person's hand.  Ask specific questions that need yes or no  answers.  Use simple words, short sentences, and a calm voice. Only give one direction at a time.  When offering choices, limit them to just 1 or 2.  Avoid correcting the person in a negative way.  If the person is struggling to find the right words, gently try to help him or her. How to recognize symptoms of stress Symptoms of stress in caregivers include:  Feeling frustrated or angry with the person with dementia.  Denying that the person has dementia or that his or her symptoms will not improve.  Feeling hopeless and unappreciated.  Difficulty sleeping.  Difficulty concentrating.  Feeling anxious, irritable, or depressed.  Developing stress-related health problems.  Feeling like you have too little time for your own life. Follow these instructions at home:   Make sure that you and the person you are caring for: ? Get regular sleep. ? Exercise regularly. ? Eat regular, nutritious meals. ? Drink enough fluid to keep your urine clear or pale yellow. ? Take over-the-counter and prescription medicines only as told by your health care providers. ? Attend all scheduled health care appointments.  Join a support group with others who are caregivers.  Ask about respite care resources so that you can have a regular break from the stress of caregiving.  Look for signs of stress in yourself and in the person you are caring for. If  you notice signs of stress, take steps to manage it.  Consider any safety risks and take steps to avoid them.  Organize medications in a pill box for each day of the week.  Create a plan to handle any legal or financial matters. Get legal or financial advice if needed.  Keep a calendar in a central location to remind the person of appointments or other activities. Tips for reducing the risk of injury  Keep floors clear of clutter. Remove rugs, magazine racks, and floor lamps.  Keep hallways well lit, especially at night.  Put a handrail and  nonslip mat in the bathtub or shower.  Put childproof locks on cabinets that contain dangerous items, such as medicines, alcohol, guns, toxic cleaning items, sharp tools or utensils, matches, and lighters.  Put the locks in places where the person cannot see or reach them easily. This will help ensure that the person does not wander out of the house and get lost.  Be prepared for emergencies. Keep a list of emergency phone numbers and addresses in a convenient area.  Remove car keys and lock garage doors so that the person does not try to get in the car and drive.  Have the person wear a bracelet that tracks locations and identifies the person as having memory problems. This should be worn at all times for safety. Where to find support: Many individuals and organizations offer support. These include:  Support groups for people with dementia and for caregivers.  Counselors or therapists.  Home health care services.  Adult day care centers. Where to find more information Alzheimer's Association: [employer] Contact a health care provider if:  The person's health is rapidly getting worse.  You are no longer able to care for the person.  Caring for the person is affecting your physical and emotional health.  The person threatens himself or herself, you, or anyone else. Summary  Dementia is a term used to describe a number of symptoms that affect memory and thinking.  Dementia usually gets worse slowly over time.  Take steps to reduce the person's risk of injury, and to plan for future care.  Caregivers need support, relief from caregiving, and time for their own lives. This information is not intended to replace advice given to you by your health care provider. Make sure you discuss any questions you have with your health care provider. Document Released: 05/25/2016 Document Revised: 06/03/2017 Document Reviewed: 05/25/2016 Elsevier Patient Education  2020 [employer].

## 2019-05-08 ENCOUNTER — Encounter: Payer: Self-pay | Admitting: Obstetrics & Gynecology

## 2019-05-08 ENCOUNTER — Other Ambulatory Visit: Payer: Self-pay

## 2019-05-08 ENCOUNTER — Ambulatory Visit (INDEPENDENT_AMBULATORY_CARE_PROVIDER_SITE_OTHER): Payer: Medicare Other | Admitting: Obstetrics & Gynecology

## 2019-05-08 DIAGNOSIS — N814 Uterovaginal prolapse, unspecified: Secondary | ICD-10-CM

## 2019-05-08 DIAGNOSIS — N812 Incomplete uterovaginal prolapse: Secondary | ICD-10-CM
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
## 2019-05-10 ENCOUNTER — Telehealth: Payer: Self-pay

## 2019-05-10 NOTE — Telephone Encounter (Signed)
Recent office note faxed to [facility], attention: LaTia.

## 2019-05-10 NOTE — Telephone Encounter (Signed)
LaTia from [employer] wanting a copy of the progress notes from the patient's visit with Amy on 05-07-2019.   Fax number [phone] attention LaTia

## 2019-08-07 ENCOUNTER — Ambulatory Visit: Payer: Medicare Other | Admitting: Family Medicine

## 2019-08-30 ENCOUNTER — Ambulatory Visit: Payer: Medicare Other | Admitting: Obstetrics & Gynecology

## 2019-09-30 ENCOUNTER — Other Ambulatory Visit: Payer: Self-pay

## 2019-09-30 ENCOUNTER — Emergency Department: Payer: Medicare Other

## 2019-09-30 ENCOUNTER — Emergency Department
Admission: EM | Admit: 2019-09-30 | Discharge: 2019-09-30 | Disposition: A | Discharge status: Home / Self Care | Payer: Medicare Other | Attending: Student | Admitting: Student

## 2019-09-30 DIAGNOSIS — I10 Essential (primary) hypertension: Secondary | ICD-10-CM | POA: Diagnosis not present

## 2019-09-30 DIAGNOSIS — R404 Transient alteration of awareness: Secondary | ICD-10-CM | POA: Insufficient documentation

## 2019-09-30 DIAGNOSIS — E039 Hypothyroidism, unspecified: Secondary | ICD-10-CM | POA: Diagnosis not present

## 2019-09-30 DIAGNOSIS — Z79899 Other long term (current) drug therapy: Secondary | ICD-10-CM | POA: Insufficient documentation

## 2019-09-30 DIAGNOSIS — G309 Alzheimer's disease, unspecified: Secondary | ICD-10-CM | POA: Insufficient documentation

## 2019-09-30 DIAGNOSIS — R55 Syncope and collapse: Secondary | ICD-10-CM | POA: Diagnosis present

## 2019-09-30 LAB — URINALYSIS, COMPLETE (UACMP) WITH MICROSCOPIC
Bacteria, UA: NONE SEEN
Bilirubin Urine: NEGATIVE
Glucose, UA: NEGATIVE mg/dL
Ketones, ur: NEGATIVE mg/dL
Nitrite: NEGATIVE
Protein, ur: NEGATIVE mg/dL
Specific Gravity, Urine: 1.013 (ref 1.005–1.030)
pH: 7 (ref 5.0–8.0)

## 2019-09-30 LAB — CBC WITH DIFFERENTIAL/PLATELET
Abs Immature Granulocytes: 0.01 10*3/uL (ref 0.00–0.07)
Basophils Absolute: 0 10*3/uL (ref 0.0–0.1)
Basophils Relative: 1 %
Eosinophils Absolute: 0.2 10*3/uL (ref 0.0–0.5)
Eosinophils Relative: 3 %
HCT: 37.9 % (ref 36.0–46.0)
Hemoglobin: 12.4 g/dL (ref 12.0–15.0)
Immature Granulocytes: 0 %
Lymphocytes Relative: 35 %
Lymphs Abs: 1.7 10*3/uL (ref 0.7–4.0)
MCH: 32 pg (ref 26.0–34.0)
MCHC: 32.7 g/dL (ref 30.0–36.0)
MCV: 97.7 fL (ref 80.0–100.0)
Monocytes Absolute: 0.5 10*3/uL (ref 0.1–1.0)
Monocytes Relative: 10 %
Neutro Abs: 2.4 10*3/uL (ref 1.7–7.7)
Neutrophils Relative %: 51 %
Platelets: 159 10*3/uL (ref 150–400)
RBC: 3.88 MIL/uL (ref 3.87–5.11)
RDW: 12.7 % (ref 11.5–15.5)
WBC: 4.7 10*3/uL (ref 4.0–10.5)
nRBC: 0 % (ref 0.0–0.2)

## 2019-09-30 LAB — COMPREHENSIVE METABOLIC PANEL
ALT: 21 U/L (ref 0–44)
AST: 27 U/L (ref 15–41)
Albumin: 3.6 g/dL (ref 3.5–5.0)
Alkaline Phosphatase: 84 U/L (ref 38–126)
Anion gap: 7 (ref 5–15)
BUN: 17 mg/dL (ref 8–23)
CO2: 29 mmol/L (ref 22–32)
Calcium: 9 mg/dL (ref 8.9–10.3)
Chloride: 104 mmol/L (ref 98–111)
Creatinine, Ser: 0.65 mg/dL (ref 0.44–1.00)
GFR calc Af Amer: >60 mL/min (ref >60)
GFR calc non Af Amer: >60 mL/min (ref >60)
Glucose, Bld: 121 mg/dL — ABNORMAL HIGH (ref 70–99)
Potassium: 3.4 mmol/L — ABNORMAL LOW (ref 3.5–5.1)
Sodium: 140 mmol/L (ref 135–145)
Total Bilirubin: 0.5 mg/dL (ref 0.3–1.2)
Total Protein: 6.5 g/dL (ref 6.5–8.1)

## 2019-09-30 LAB — T4, FREE: Free T4: 1.14 ng/dL — ABNORMAL HIGH (ref 0.61–1.12)

## 2019-09-30 LAB — TROPONIN I (HIGH SENSITIVITY)
Troponin I (High Sensitivity): 7 ng/L (ref <18)
Troponin I (High Sensitivity): 7 ng/L (ref <18)

## 2019-09-30 LAB — TSH: TSH: 9.18 u[IU]/mL — ABNORMAL HIGH (ref 0.350–4.500)

## 2019-09-30 LAB — LIPASE, BLOOD: Lipase: 23 U/L (ref 11–51)

## 2019-09-30 MED ORDER — POTASSIUM CHLORIDE 20 MEQ/15ML (10%) PO SOLN
40.0000 meq | Freq: Once | ORAL | Status: AC
  Administered 2019-09-30: 40 meq via ORAL
  Filled 2019-09-30: qty 30

## 2019-09-30 MED ORDER — ACETAMINOPHEN 500 MG PO TABS
1000.0000 mg | ORAL_TABLET | Freq: Once | ORAL | Status: AC
  Administered 2019-09-30: 16:00:00 1000 mg via ORAL
  Filled 2019-09-30: qty 2

## 2019-09-30 NOTE — ED Notes (Signed)
Patient has been cleaned and placed in new diaper.
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
## 2019-09-30 NOTE — Discharge Instructions (Addendum)
Thank you for letting us take care of you in the emergency department today.  ° °Please continue to take any regular, prescribed medications.  ° °Please follow up with: °- Your primary care doctor to review your ER visit and follow up on your symptoms.  ° °Please return to the ER for any new or worsening symptoms.  ° °

## 2019-09-30 NOTE — ED Notes (Signed)
Patient being transported to CT at this time 

## 2019-09-30 NOTE — ED Provider Notes (Signed)
Repeat troponin stable at 7.  UA without significant evidence of infection (only notable for small leukocytes and WBC), sent for culture.  CT head without acute intracranial abnormalities.  Family at bedside states that patient remains at baseline.  They continue to desire discharge with outpatient follow-up.  Feel this is reasonable given unremarkable work-up.  We will proceed with discharge as planned.  Given return precautions.   Lilia Pro., MD 09/30/19 [phone]

## 2019-09-30 NOTE — ED Triage Notes (Signed)
Patient arrived via EMS, patient is from [facility]. Patient is at baseline which is AOx0 and uncertain of ambulatory status. Staff called 911 due to patient having a very minor period unresponsives while sitting waiting for evening chow. Patient did not hit head, no deformity, bruising or pain upon palpation, patient does keep repeating "Im going to shit".

## 2019-10-01 LAB — URINE CULTURE: Culture: NO GROWTH

## 2019-11-03 ENCOUNTER — Emergency Department: Payer: Medicare Other

## 2019-11-03 ENCOUNTER — Other Ambulatory Visit: Payer: Self-pay

## 2019-11-03 ENCOUNTER — Emergency Department
Admission: EM | Admit: 2019-11-03 | Discharge: 2019-11-03 | Disposition: A | Discharge status: Skilled Nursing Facility | Payer: Medicare Other | Attending: Emergency Medicine | Admitting: Emergency Medicine

## 2019-11-03 DIAGNOSIS — Z20822 Contact with and (suspected) exposure to covid-19: Secondary | ICD-10-CM | POA: Insufficient documentation

## 2019-11-03 DIAGNOSIS — G309 Alzheimer's disease, unspecified: Secondary | ICD-10-CM | POA: Diagnosis not present

## 2019-11-03 DIAGNOSIS — I10 Essential (primary) hypertension: Secondary | ICD-10-CM | POA: Insufficient documentation

## 2019-11-03 DIAGNOSIS — Z79899 Other long term (current) drug therapy: Secondary | ICD-10-CM | POA: Diagnosis not present

## 2019-11-03 DIAGNOSIS — R4182 Altered mental status, unspecified: Secondary | ICD-10-CM | POA: Diagnosis present

## 2019-11-03 LAB — URINALYSIS, COMPLETE (UACMP) WITH MICROSCOPIC
Bacteria, UA: NONE SEEN
Bilirubin Urine: NEGATIVE
Glucose, UA: 50 mg/dL — AB
Hgb urine dipstick: NEGATIVE
Ketones, ur: NEGATIVE mg/dL
Nitrite: NEGATIVE
Protein, ur: NEGATIVE mg/dL
Specific Gravity, Urine: 1.015 (ref 1.005–1.030)
pH: 7 (ref 5.0–8.0)

## 2019-11-03 LAB — CBC WITH DIFFERENTIAL/PLATELET
Abs Immature Granulocytes: 0.01 10*3/uL (ref 0.00–0.07)
Basophils Absolute: 0 10*3/uL (ref 0.0–0.1)
Basophils Relative: 1 %
Eosinophils Absolute: 0.1 10*3/uL (ref 0.0–0.5)
Eosinophils Relative: 3 %
HCT: 37.4 % (ref 36.0–46.0)
Hemoglobin: 11.8 g/dL — ABNORMAL LOW (ref 12.0–15.0)
Immature Granulocytes: 0 %
Lymphocytes Relative: 33 %
Lymphs Abs: 1.4 10*3/uL (ref 0.7–4.0)
MCH: 32.2 pg (ref 26.0–34.0)
MCHC: 31.6 g/dL (ref 30.0–36.0)
MCV: 101.9 fL — ABNORMAL HIGH (ref 80.0–100.0)
Monocytes Absolute: 0.7 10*3/uL (ref 0.1–1.0)
Monocytes Relative: 15 %
Neutro Abs: 2.2 10*3/uL (ref 1.7–7.7)
Neutrophils Relative %: 48 %
Platelets: 158 10*3/uL (ref 150–400)
RBC: 3.67 MIL/uL — ABNORMAL LOW (ref 3.87–5.11)
RDW: 14.1 % (ref 11.5–15.5)
WBC: 4.4 10*3/uL (ref 4.0–10.5)
nRBC: 0 % (ref 0.0–0.2)

## 2019-11-03 LAB — T4, FREE: Free T4: 1.17 ng/dL — ABNORMAL HIGH (ref 0.61–1.12)

## 2019-11-03 LAB — LACTIC ACID, PLASMA: Lactic Acid, Venous: 1 mmol/L (ref 0.5–1.9)

## 2019-11-03 LAB — GLUCOSE, CAPILLARY: Glucose-Capillary: 75 mg/dL (ref 70–99)

## 2019-11-03 LAB — COMPREHENSIVE METABOLIC PANEL
ALT: 35 U/L (ref 0–44)
AST: 43 U/L — ABNORMAL HIGH (ref 15–41)
Albumin: 3.6 g/dL (ref 3.5–5.0)
Alkaline Phosphatase: 90 U/L (ref 38–126)
Anion gap: 9 (ref 5–15)
BUN: 32 mg/dL — ABNORMAL HIGH (ref 8–23)
CO2: 27 mmol/L (ref 22–32)
Calcium: 9.2 mg/dL (ref 8.9–10.3)
Chloride: 105 mmol/L (ref 98–111)
Creatinine, Ser: 0.62 mg/dL (ref 0.44–1.00)
GFR calc Af Amer: >60 mL/min (ref >60)
GFR calc non Af Amer: >60 mL/min (ref >60)
Glucose, Bld: 67 mg/dL — ABNORMAL LOW (ref 70–99)
Potassium: 5.4 mmol/L — ABNORMAL HIGH (ref 3.5–5.1)
Sodium: 141 mmol/L (ref 135–145)
Total Bilirubin: 0.7 mg/dL (ref 0.3–1.2)
Total Protein: 6.7 g/dL (ref 6.5–8.1)

## 2019-11-03 LAB — RESPIRATORY PANEL BY RT PCR (FLU A&B, COVID)
Influenza A by PCR: NEGATIVE
Influenza B by PCR: NEGATIVE
SARS Coronavirus 2 by RT PCR: NEGATIVE

## 2019-11-03 LAB — TROPONIN I (HIGH SENSITIVITY)
Troponin I (High Sensitivity): 8 ng/L (ref <18)
Troponin I (High Sensitivity): 7 ng/L (ref <18)

## 2019-11-03 LAB — TSH: TSH: 5.4 u[IU]/mL — ABNORMAL HIGH (ref 0.350–4.500)

## 2019-11-03 MED ORDER — DEXTROSE-NACL 5-0.9 % IV SOLN
1000.0000 mL | Freq: Once | INTRAVENOUS | Status: AC
  Administered 2019-11-03: 500 mL via INTRAVENOUS

## 2019-11-03 NOTE — ED Notes (Signed)
Pt transported for CT 

## 2019-11-03 NOTE — ED Notes (Signed)
Chrissy RN hung other 538ml bag of d5% NS

## 2019-11-03 NOTE — ED Notes (Signed)
Called report to [facility]- spoke with Audry Pili, RN

## 2019-11-03 NOTE — ED Triage Notes (Signed)
Pt arrives via EMS from [facility] after staff found her unresponsive in a chair- per staff be was responsive to painful stimuli only- per EMS per has been resposive to verbal stimuli- per EMS this has happened before when pt had an "electrolyte imbalance"- pt's ankles noted to be swollen

## 2019-11-03 NOTE — ED Notes (Signed)
Pt daughter signed for patient discharge d/t pt having dementia

## 2019-11-03 NOTE — ED Notes (Signed)
Pt began to desat as low as 79% with good pleth- Dr Joni Fears and Cannon Kettle, PA to bedside- pt placed on 6L [place] and came up to 98%

## 2019-11-03 NOTE — ED Notes (Signed)
Lab at bedside

## 2019-11-03 NOTE — ED Notes (Signed)
Report given to Jenna, RN

## 2019-11-03 NOTE — ED Notes (Signed)
Attempted in and out cath with no success- pt placed on purwick- barehugger in place

## 2019-11-03 NOTE — ED Notes (Signed)
Glycerine swabs provided

## 2019-11-03 NOTE — ED Notes (Signed)
Pt daughter called out and states that pt woke up for a second and states that she was hot- rechecked rectal temp was 93.3- pt remains on barehugger at this time

## 2019-11-03 NOTE — ED Notes (Signed)
Called lab to obtain blood cultures and other blood work

## 2019-11-03 NOTE — ED Notes (Signed)
Pt continues to attempt to take off monitoring equipment and barehugger

## 2019-11-03 NOTE — ED Notes (Signed)
Dr Joni Fears turned [place] to 4L

## 2019-11-03 NOTE — Discharge Instructions (Addendum)
Misty Carroll has a returned to her baseline after evaluation in the ED. She has no signs of blood infection, urinary infection, pneumonia, or COVID. She is not dehydrated nor does she have any electrolyte imbalance. She was a little dry and seemed to respond IV fluids. She should be encouraged to drink throughout the day. Have her follow-up with her PCP or return as needed.

## 2019-11-03 NOTE — ED Notes (Signed)
Pt stuck twice for IV per EMS and stuck twice in ED by Chrissy RN and this RN- able to obtain lt green and lavender tubes
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
## 2019-11-08 LAB — CULTURE, BLOOD (ROUTINE X 2)
Culture: NO GROWTH
Culture: NO GROWTH
Special Requests: ADEQUATE

## 2019-12-01 ENCOUNTER — Emergency Department: Payer: Medicare Other

## 2019-12-01 ENCOUNTER — Encounter: Payer: Self-pay | Admitting: Emergency Medicine

## 2019-12-01 ENCOUNTER — Inpatient Hospital Stay
Admission: EM | Admit: 2019-12-01 | Discharge: 2019-12-03 | DRG: 689 | Disposition: A | Discharge status: Skilled Nursing Facility | Payer: Medicare Other | Source: Skilled Nursing Facility | Attending: Internal Medicine | Admitting: Internal Medicine

## 2019-12-01 DIAGNOSIS — Z20822 Contact with and (suspected) exposure to covid-19: Secondary | ICD-10-CM | POA: Diagnosis present

## 2019-12-01 DIAGNOSIS — G301 Alzheimer's disease with late onset: Secondary | ICD-10-CM | POA: Diagnosis present

## 2019-12-01 DIAGNOSIS — Z66 Do not resuscitate: Secondary | ICD-10-CM | POA: Diagnosis present

## 2019-12-01 DIAGNOSIS — N390 Urinary tract infection, site not specified: Secondary | ICD-10-CM | POA: Diagnosis present

## 2019-12-01 DIAGNOSIS — G40909 Epilepsy, unspecified, not intractable, without status epilepticus: Secondary | ICD-10-CM | POA: Diagnosis present

## 2019-12-01 DIAGNOSIS — I10 Essential (primary) hypertension: Secondary | ICD-10-CM | POA: Diagnosis present

## 2019-12-01 DIAGNOSIS — E039 Hypothyroidism, unspecified: Secondary | ICD-10-CM | POA: Diagnosis present

## 2019-12-01 DIAGNOSIS — R8290 Unspecified abnormal findings in urine: Secondary | ICD-10-CM | POA: Diagnosis not present

## 2019-12-01 DIAGNOSIS — R4189 Other symptoms and signs involving cognitive functions and awareness: Secondary | ICD-10-CM | POA: Diagnosis present

## 2019-12-01 DIAGNOSIS — R569 Unspecified convulsions: Secondary | ICD-10-CM | POA: Diagnosis not present

## 2019-12-01 DIAGNOSIS — G309 Alzheimer's disease, unspecified: Secondary | ICD-10-CM | POA: Diagnosis present

## 2019-12-01 DIAGNOSIS — Z9049 Acquired absence of other specified parts of digestive tract: Secondary | ICD-10-CM

## 2019-12-01 DIAGNOSIS — G92 Toxic encephalopathy: Secondary | ICD-10-CM | POA: Diagnosis present

## 2019-12-01 DIAGNOSIS — R040 Epistaxis: Secondary | ICD-10-CM | POA: Diagnosis present

## 2019-12-01 DIAGNOSIS — F02818 Dementia in other diseases classified elsewhere, unspecified severity, with other behavioral disturbance: Secondary | ICD-10-CM | POA: Diagnosis present

## 2019-12-01 DIAGNOSIS — T68XXXA Hypothermia, initial encounter: Secondary | ICD-10-CM

## 2019-12-01 DIAGNOSIS — R680 Hypothermia, not associated with low environmental temperature: Secondary | ICD-10-CM | POA: Diagnosis present

## 2019-12-01 DIAGNOSIS — E440 Moderate protein-calorie malnutrition: Secondary | ICD-10-CM | POA: Diagnosis present

## 2019-12-01 DIAGNOSIS — F0281 Dementia in other diseases classified elsewhere with behavioral disturbance: Secondary | ICD-10-CM | POA: Diagnosis present

## 2019-12-01 DIAGNOSIS — Z6820 Body mass index (BMI) 20.0-20.9, adult: Secondary | ICD-10-CM | POA: Diagnosis not present

## 2019-12-01 DIAGNOSIS — Z888 Allergy status to other drugs, medicaments and biological substances status: Secondary | ICD-10-CM

## 2019-12-01 DIAGNOSIS — Z85828 Personal history of other malignant neoplasm of skin: Secondary | ICD-10-CM

## 2019-12-01 DIAGNOSIS — Z79899 Other long term (current) drug therapy: Secondary | ICD-10-CM

## 2019-12-01 DIAGNOSIS — I959 Hypotension, unspecified: Secondary | ICD-10-CM | POA: Diagnosis present

## 2019-12-01 DIAGNOSIS — Z79890 Hormone replacement therapy (postmenopausal): Secondary | ICD-10-CM | POA: Diagnosis not present

## 2019-12-01 LAB — CBC WITH DIFFERENTIAL/PLATELET
Abs Immature Granulocytes: 0.01 10*3/uL (ref 0.00–0.07)
Basophils Absolute: 0 10*3/uL (ref 0.0–0.1)
Basophils Relative: 0 %
Eosinophils Absolute: 0 10*3/uL (ref 0.0–0.5)
Eosinophils Relative: 0 %
HCT: 36.4 % (ref 36.0–46.0)
Hemoglobin: 12 g/dL (ref 12.0–15.0)
Immature Granulocytes: 0 %
Lymphocytes Relative: 12 %
Lymphs Abs: 0.6 10*3/uL — ABNORMAL LOW (ref 0.7–4.0)
MCH: 32 pg (ref 26.0–34.0)
MCHC: 33 g/dL (ref 30.0–36.0)
MCV: 97.1 fL (ref 80.0–100.0)
Monocytes Absolute: 0.6 10*3/uL (ref 0.1–1.0)
Monocytes Relative: 12 %
Neutro Abs: 3.7 10*3/uL (ref 1.7–7.7)
Neutrophils Relative %: 76 %
Platelets: 148 10*3/uL — ABNORMAL LOW (ref 150–400)
RBC: 3.75 MIL/uL — ABNORMAL LOW (ref 3.87–5.11)
RDW: 14.6 % (ref 11.5–15.5)
WBC: 4.9 10*3/uL (ref 4.0–10.5)
nRBC: 0 % (ref 0.0–0.2)

## 2019-12-01 LAB — URINE DRUG SCREEN, QUALITATIVE (ARMC ONLY)
Amphetamines, Ur Screen: NOT DETECTED
Barbiturates, Ur Screen: NOT DETECTED
Benzodiazepine, Ur Scrn: NOT DETECTED
Cannabinoid 50 Ng, Ur ~~LOC~~: NOT DETECTED
Cocaine Metabolite,Ur ~~LOC~~: NOT DETECTED
MDMA (Ecstasy)Ur Screen: NOT DETECTED
Methadone Scn, Ur: NOT DETECTED
Opiate, Ur Screen: NOT DETECTED
Phencyclidine (PCP) Ur S: NOT DETECTED
Tricyclic, Ur Screen: NOT DETECTED

## 2019-12-01 LAB — URINALYSIS, COMPLETE (UACMP) WITH MICROSCOPIC
Bilirubin Urine: NEGATIVE
Glucose, UA: NEGATIVE mg/dL
Hgb urine dipstick: NEGATIVE
Ketones, ur: NEGATIVE mg/dL
Nitrite: NEGATIVE
Protein, ur: NEGATIVE mg/dL
Specific Gravity, Urine: 1.014 (ref 1.005–1.030)
pH: 7 (ref 5.0–8.0)

## 2019-12-01 LAB — COMPREHENSIVE METABOLIC PANEL
ALT: 29 U/L (ref 0–44)
AST: 33 U/L (ref 15–41)
Albumin: 3.8 g/dL (ref 3.5–5.0)
Alkaline Phosphatase: 97 U/L (ref 38–126)
Anion gap: 10 (ref 5–15)
BUN: 19 mg/dL (ref 8–23)
CO2: 30 mmol/L (ref 22–32)
Calcium: 9.7 mg/dL (ref 8.9–10.3)
Chloride: 102 mmol/L (ref 98–111)
Creatinine, Ser: 0.57 mg/dL (ref 0.44–1.00)
GFR calc Af Amer: >60 mL/min (ref >60)
GFR calc non Af Amer: >60 mL/min (ref >60)
Glucose, Bld: 94 mg/dL (ref 70–99)
Potassium: 3.9 mmol/L (ref 3.5–5.1)
Sodium: 142 mmol/L (ref 135–145)
Total Bilirubin: 0.7 mg/dL (ref 0.3–1.2)
Total Protein: 7 g/dL (ref 6.5–8.1)

## 2019-12-01 LAB — LACTIC ACID, PLASMA: Lactic Acid, Venous: 1.2 mmol/L (ref 0.5–1.9)

## 2019-12-01 LAB — TROPONIN I (HIGH SENSITIVITY): Troponin I (High Sensitivity): 11 ng/L (ref <18)

## 2019-12-01 LAB — SARS CORONAVIRUS 2 (TAT 6-24 HRS): SARS Coronavirus 2: NEGATIVE

## 2019-12-01 LAB — PROCALCITONIN: Procalcitonin: <0.1 ng/mL

## 2019-12-01 MED ORDER — SODIUM CHLORIDE 0.9 % IV SOLN
1.0000 g | Freq: Once | INTRAVENOUS | Status: AC
  Administered 2019-12-01: 1 g via INTRAVENOUS
  Filled 2019-12-01: qty 10

## 2019-12-01 MED ORDER — SIMVASTATIN 20 MG PO TABS
20.0000 mg | ORAL_TABLET | Freq: Every day | ORAL | Status: DC
  Administered 2019-12-02: 20 mg via ORAL
  Filled 2019-12-01: qty 1

## 2019-12-01 MED ORDER — LEVOTHYROXINE SODIUM 88 MCG PO TABS
88.0000 ug | ORAL_TABLET | Freq: Every day | ORAL | Status: DC
  Administered 2019-12-03: 88 ug via ORAL
  Filled 2019-12-01 (×4): qty 1

## 2019-12-01 MED ORDER — ONDANSETRON HCL 4 MG/2ML IJ SOLN: 4.0000 mg | Freq: Four times a day (QID) | INTRAMUSCULAR | Status: DC | PRN

## 2019-12-01 MED ORDER — ONDANSETRON HCL 4 MG PO TABS: 4.0000 mg | ORAL_TABLET | Freq: Four times a day (QID) | ORAL | Status: DC | PRN

## 2019-12-01 MED ORDER — MAGNESIUM OXIDE 400 (241.3 MG) MG PO TABS
400.0000 mg | ORAL_TABLET | Freq: Every day | ORAL | Status: DC
  Administered 2019-12-02 – 2019-12-03 (×2): 400 mg via ORAL
  Filled 2019-12-01 (×3): qty 1

## 2019-12-01 MED ORDER — LISINOPRIL 5 MG PO TABS
2.5000 mg | ORAL_TABLET | Freq: Every day | ORAL | Status: DC
  Administered 2019-12-02 – 2019-12-03 (×2): 2.5 mg via ORAL
  Filled 2019-12-01 (×2): qty 1

## 2019-12-01 MED ORDER — SODIUM CHLORIDE 0.9 % IV SOLN: INTRAVENOUS | Status: DC

## 2019-12-01 MED ORDER — NYSTATIN 100000 UNIT/GM EX POWD
1.0000 "application " | Freq: Two times a day (BID) | CUTANEOUS | Status: DC
  Administered 2019-12-02 – 2019-12-03 (×2): 1 via TOPICAL
  Filled 2019-12-01 (×2): qty 15

## 2019-12-01 MED ORDER — MIRTAZAPINE 15 MG PO TABS
7.5000 mg | ORAL_TABLET | Freq: Every day | ORAL | Status: DC
  Administered 2019-12-02: 7.5 mg via ORAL
  Filled 2019-12-01: qty 1

## 2019-12-01 MED ORDER — RISPERIDONE 0.5 MG PO TABS
0.5000 mg | ORAL_TABLET | Freq: Every day | ORAL | Status: DC
  Administered 2019-12-02: 0.5 mg via ORAL
  Filled 2019-12-01 (×2): qty 1

## 2019-12-01 MED ORDER — LEVETIRACETAM 500 MG PO TABS
500.0000 mg | ORAL_TABLET | Freq: Two times a day (BID) | ORAL | Status: DC
  Administered 2019-12-02 – 2019-12-03 (×3): 500 mg via ORAL
  Filled 2019-12-01 (×4): qty 1

## 2019-12-01 MED ORDER — ZINC OXIDE 11.3 % EX CREA
1.0000 "application " | TOPICAL_CREAM | Freq: Two times a day (BID) | CUTANEOUS | Status: DC | PRN
  Filled 2019-12-01: qty 56

## 2019-12-01 MED ORDER — LEVETIRACETAM IN NACL 500 MG/100ML IV SOLN: 500.0000 mg | Freq: Two times a day (BID) | INTRAVENOUS | Status: DC

## 2019-12-01 NOTE — ED Triage Notes (Addendum)
Pt to ED by EMS from [facility] unit with AMS. Staff state pt suffered nose bleed while walking down the hallway and upon sitting became altered. EMS VS WDL except temp of 92.8 Axillary. Upon arrival to ED pt's temp 91.2 Rectal. Bear hugger placed on pt.
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
## 2019-12-01 NOTE — ED Notes (Signed)
Pt alert but confused. Pts daughter at bedside trying to prevent her from removing the BP cuff and tugging at her IV. Pt provided with busy blanket which immediately gained her attention and she began working at the buttons and velcro closures.

## 2019-12-01 NOTE — ED Notes (Signed)
Pt's daughter at bedside and states pt has had similar episode before. Per daughter pt was dx with severe dehydration. Daughter also states pt has chronic nose bleeds.

## 2019-12-01 NOTE — ED Notes (Signed)
Pt is now alert at baseline per daughter. Pt placed on posey alarm pad.

## 2019-12-01 NOTE — ED Notes (Signed)
Rectal temp checked and bear hugger re-started.
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
## 2019-12-02 ENCOUNTER — Encounter: Payer: Self-pay | Admitting: Internal Medicine

## 2019-12-02 DIAGNOSIS — G301 Alzheimer's disease with late onset: Secondary | ICD-10-CM

## 2019-12-02 DIAGNOSIS — F0281 Dementia in other diseases classified elsewhere with behavioral disturbance: Secondary | ICD-10-CM

## 2019-12-02 LAB — CBC
HCT: 34.1 % — ABNORMAL LOW (ref 36.0–46.0)
Hemoglobin: 11.2 g/dL — ABNORMAL LOW (ref 12.0–15.0)
MCH: 31.7 pg (ref 26.0–34.0)
MCHC: 32.8 g/dL (ref 30.0–36.0)
MCV: 96.6 fL (ref 80.0–100.0)
Platelets: 154 10*3/uL (ref 150–400)
RBC: 3.53 MIL/uL — ABNORMAL LOW (ref 3.87–5.11)
RDW: 15 % (ref 11.5–15.5)
WBC: 5.8 10*3/uL (ref 4.0–10.5)
nRBC: 0 % (ref 0.0–0.2)

## 2019-12-02 LAB — PROCALCITONIN: Procalcitonin: <0.1 ng/mL

## 2019-12-02 LAB — BASIC METABOLIC PANEL
Anion gap: 7 (ref 5–15)
BUN: 18 mg/dL (ref 8–23)
CO2: 31 mmol/L (ref 22–32)
Calcium: 9.1 mg/dL (ref 8.9–10.3)
Chloride: 106 mmol/L (ref 98–111)
Creatinine, Ser: 0.67 mg/dL (ref 0.44–1.00)
GFR calc Af Amer: >60 mL/min (ref >60)
GFR calc non Af Amer: >60 mL/min (ref >60)
Glucose, Bld: 65 mg/dL — ABNORMAL LOW (ref 70–99)
Potassium: 3.5 mmol/L (ref 3.5–5.1)
Sodium: 144 mmol/L (ref 135–145)

## 2019-12-02 LAB — URINE CULTURE: Culture: NO GROWTH

## 2019-12-02 LAB — MRSA PCR SCREENING: MRSA by PCR: NEGATIVE

## 2019-12-02 MED ORDER — LEVETIRACETAM 100 MG/ML PO SOLN
500.0000 mg | ORAL | Status: AC
  Administered 2019-12-02: 500 mg via ORAL
  Filled 2019-12-02: qty 5

## 2019-12-02 MED ORDER — SODIUM CHLORIDE 0.9 % IV SOLN
1.0000 g | INTRAVENOUS | Status: DC
  Administered 2019-12-02 – 2019-12-03 (×2): 1 g via INTRAVENOUS
  Filled 2019-12-02: qty 10
  Filled 2019-12-02 (×2): qty 1

## 2019-12-02 MED ORDER — ENOXAPARIN SODIUM 40 MG/0.4ML ~~LOC~~ SOLN
40.0000 mg | SUBCUTANEOUS | Status: DC
  Administered 2019-12-02: 40 mg via SUBCUTANEOUS
  Filled 2019-12-02: qty 0.4

## 2019-12-02 NOTE — Progress Notes (Signed)
Patient pulled IV out and all of her clothes off. While attempting to connect IV to second site patient became combative and was cursing. She threw her blankets off and was attempting to pull other IV site out and remove her purewick. Mittens placed on both hands. Coban lightly wrapped around IV site to contain tubing. Patient changed and repositioned in bed.

## 2019-12-02 NOTE — Progress Notes (Signed)
Progress Note    Misty Carroll  [id number] DOB: 1932/08/14  DOA: 12/01/2019 PCP: Kirk Ruths, MD      Brief Narrative:    Medical records reviewed and are as summarized below:  Misty Carroll is an 86 y.o. female with medical history significant for dementia, hypothyroidism and hypertension who was sent from the skilled nursing facility for evaluation of change in mental status.  Patient has episodes like this but the facility staff got concerned because this episode lasted longer than her prior episodes.  She was also said to have had an episode of epistaxis prior to this event. In the ED, patient was hypothermic with temperature of 91.2 F per rectum and axillary temperature was 92.8 F. She was admitted to the hospital for unresponsiveness and UTI   Assessment/Plan:   Principal Problem:   Unresponsiveness Active Problems:   Alzheimer's type dementia with late onset with behavioral disturbance (HCC)   Abnormal urinalysis   Seizures ([place])   Essential hypertension   Acute toxic metabolic encephalopathy: Patient is responsive but still very confused.   Acute UTI Continue IV Rocephin.  Follow-up urine culture.  Her daughter said that patient has a vaginal pessary which predisposes her to UTI.   Seizure disorder Continue Keppra Place patient on seizure precautions   Dementia Continue risperidone and mirtazapine   Hypertension Continue lisinopril   Body mass index is 20.2 kg/m.   Family Communication/Anticipated D/C date and plan/Code Status   DVT prophylaxis: Lovenox Code Status: DNR Family Communication: Plan discussed with her daughter, Marcie Bal, at the bedside Disposition Plan:    Status is: Inpatient  Remains inpatient appropriate because:IV treatments appropriate due to intensity of illness or inability to take PO and Inpatient level of care appropriate due to severity of illness   Dispo: The patient is from: SNF   Anticipated d/c is to: SNF              Anticipated d/c date is: 1 day              Patient currently is not medically stable to d/c.            Subjective:   Patient is confused and unable to provide any history.  Overnight events noted.  Per nursing staff, patient had pulled out her IV line and had been taking her clothes off.  She was also combative.  Objective:    Vitals:   12/01/19 2200 12/01/19 2230 12/01/19 2357 12/02/19 1100  BP: 129/65 133/60 (!) 124/55 (!) 131/56  Pulse: 98  92 88  Resp: (!) 22 (!) 22 17 14   Temp: 99 F (37.2 C)  97.7 F (36.5 C) (!) 97 F (36.1 C)  TempSrc: Rectal  Oral Axillary  SpO2: 98% 97% 94% 92%  Weight:      Height:       No data found.   Intake/Output Summary (Last 24 hours) at 12/02/2019 1435 Last data filed at 12/02/2019 1345 Gross per 24 hour  Intake 1627.15 ml  Output 1050 ml  Net 577.15 ml   Filed Weights   12/01/19 1013  Weight: 58.5 kg    Exam:  GEN: NAD SKIN: No rash EYES: EOMI ENT: MMM CV: RRR PULM: CTA B ABD: soft, ND, NT, +BS CNS: Alert but confused and easily agitated EXT: No edema or tenderness   Data Reviewed:   I have personally reviewed following labs and imaging studies:  Labs: Labs show the following:  Basic Metabolic Panel: Recent Labs  Lab 12/01/19 1002 12/02/19 0416  NA 142 144  K 3.9 3.5  CL 102 106  CO2 30 31  GLUCOSE 94 65*  BUN 19 18  CREATININE 0.57 0.67  CALCIUM 9.7 9.1   GFR Estimated Creatinine Clearance: 47.5 mL/min (by C-G formula based on SCr of 0.67 mg/dL). Liver Function Tests: Recent Labs  Lab 12/01/19 1002  AST 33  ALT 29  ALKPHOS 97  BILITOT 0.7  PROT 7.0  ALBUMIN 3.8   No results for input(s): LIPASE, AMYLASE in the last 168 hours. No results for input(s): AMMONIA in the last 168 hours. Coagulation profile No results for input(s): INR, PROTIME in the last 168 hours.  CBC: Recent Labs  Lab 12/01/19 1002 12/02/19 0416  WBC 4.9 5.8    NEUTROABS 3.7  --   HGB 12.0 11.2*  HCT 36.4 34.1*  MCV 97.1 96.6  PLT 148* 154   Cardiac Enzymes: No results for input(s): CKTOTAL, CKMB, CKMBINDEX, TROPONINI in the last 168 hours. BNP (last 3 results) No results for input(s): PROBNP in the last 8760 hours. CBG: No results for input(s): GLUCAP in the last 168 hours. D-Dimer: No results for input(s): DDIMER in the last 72 hours. Hgb A1c: No results for input(s): HGBA1C in the last 72 hours. Lipid Profile: No results for input(s): CHOL, HDL, LDLCALC, TRIG, CHOLHDL, LDLDIRECT in the last 72 hours. Thyroid function studies: No results for input(s): TSH, T4TOTAL, T3FREE, THYROIDAB in the last 72 hours.  Invalid input(s): FREET3 Anemia work up: No results for input(s): VITAMINB12, FOLATE, FERRITIN, TIBC, IRON, RETICCTPCT in the last 72 hours. Sepsis Labs: Recent Labs  Lab 12/01/19 0959 12/01/19 1002 12/01/19 1230 12/02/19 0416  PROCALCITON  --   --  <0.10 <0.10  WBC  --  4.9  --  5.8  LATICACIDVEN 1.2  --   --   --     Microbiology Recent Results (from the past 240 hour(s))  Culture, blood (routine x 2)     Status: None (Preliminary result)   Collection Time: 12/01/19  9:59 AM   Specimen: BLOOD  Result Value Ref Range Status   Specimen Description BLOOD RIGHT ANTECUBITAL  Final   Special Requests   Final    BOTTLES DRAWN AEROBIC AND ANAEROBIC Blood Culture adequate volume   Culture   Final    NO GROWTH < 24 HOURS Performed at [facility], [address], [place], [place] [address]    Report Status PENDING  Incomplete  Culture, blood (routine x 2)     Status: None (Preliminary result)   Collection Time: 12/01/19  9:59 AM   Specimen: BLOOD  Result Value Ref Range Status   Specimen Description BLOOD BLOOD LEFT FOREARM  Final   Special Requests   Final    BOTTLES DRAWN AEROBIC AND ANAEROBIC Blood Culture adequate volume   Culture   Final    NO GROWTH < 24 HOURS Performed at [facility], [address] [place], [place] [address]    Report Status PENDING  Incomplete  Urine culture     Status: None   Collection Time: 12/01/19 12:09 PM   Specimen: Urine, Random  Result Value Ref Range Status   Specimen Description   Final    URINE, RANDOM Performed at [facility], [address], [place], [place] [address]    Special Requests   Final    NONE Performed at [facility], [address]  [place], [place] [address]    Culture   Final    NO GROWTH Performed at [facility], [place] [address], [place], [place] [address]    Report Status 12/02/2019 FINAL  Final  SARS CORONAVIRUS 2 (TAT 6-24 HRS) Nasopharyngeal Nasopharyngeal Swab     Status: None   Collection Time: 12/01/19  2:10 PM   Specimen: Nasopharyngeal Swab  Result Value Ref Range Status   SARS Coronavirus 2 NEGATIVE NEGATIVE Final    Comment: (NOTE) SARS-CoV-2 target nucleic acids are NOT DETECTED. The SARS-CoV-2 RNA is generally detectable in upper and lower respiratory specimens during the acute phase of infection. Negative results do not preclude SARS-CoV-2 infection, do not rule out co-infections with other pathogens, and should not be used as the sole basis for treatment or other patient management decisions. Negative results must be combined with clinical observations, patient history, and epidemiological information. The expected result is Negative. Fact Sheet for Patients: SugarRoll.be Fact Sheet for Healthcare Providers: https://www.woods-mathews.com/ This test is not yet approved or cleared by the [place] FDA and  has been authorized for detection and/or diagnosis of SARS-CoV-2 by FDA under an Emergency Use Authorization (EUA). This EUA will remain  in effect (meaning this test can be used) for the duration of the COVID-19 declaration under Section 56 4(b)(1) of the Act, 21 U.S.C. section 360bbb-3(b)(1), unless the authorization is  terminated or revoked sooner. Performed at [facility], [place] [address], [place], [place] [address]   MRSA PCR Screening     Status: None   Collection Time: 12/02/19  3:57 AM   Specimen: Nasal Mucosa; Nasopharyngeal  Result Value Ref Range Status   MRSA by PCR NEGATIVE NEGATIVE Final    Comment:        The GeneXpert MRSA Assay (FDA approved for NASAL specimens only), is one component of a comprehensive MRSA colonization surveillance program. It is not intended to diagnose MRSA infection nor to guide or monitor treatment for MRSA infections. Performed at [facility], [address] [place], [place] [address]     Procedures and diagnostic studies:  CT Head Wo Contrast  Result Date: 12/01/2019 CLINICAL DATA:  Staff state pt suffered nose bleed while walking down the hallway and upon sitting became altered. EXAM: CT HEAD WITHOUT CONTRAST TECHNIQUE: Contiguous axial images were obtained from the base of the skull through the vertex without intravenous contrast. COMPARISON:  Stat CT head 11/03/2019 FINDINGS: Brain: No evidence of acute infarction, hemorrhage, hydrocephalus, extra-axial collection or mass lesion/mass effect. Global atrophy. Chronic microvascular ischemic changes. Chronic encephalomalacia in the anterior left temporal lobe at the site of prior hemorrhage. Vascular: No hyperdense vessel or unexpected calcification. Skull: Normal. Negative for fracture or focal lesion. Sinuses/Orbits: No acute finding. Other: None. IMPRESSION: 1. No acute intracranial pathology. 2. Global atrophy and chronic microvascular ischemic changes. Electronically Signed   By: Audie Pinto M.D.   On: 12/01/2019 11:09   DG Chest Portable 1 View  Result Date: 12/01/2019 CLINICAL DATA:  Epistaxis.  Altered mental status. EXAM: PORTABLE CHEST 1 VIEW COMPARISON:  11/03/2019 FINDINGS: Heart size is within normal limits and stable. Aortic atherosclerosis noted. Central peribronchial  thickening and bilateral upper lobe scarring are stable. No evidence of acute infiltrate or edema. No evidence of pneumothorax or pleural effusion. New radiodensity is seen at the level of thoracic inlet, just to the left side of the trachea. This was not seen on previous study and could be due to calcification or foreign  body. IMPRESSION: 1. No acute cardiopulmonary disease. 2. Radiodensity at level of thoracic inlet, not seen on previous study. This could be due to calcification or foreign body. Recommend clinical correlation, and consider chest CT without contrast for further evaluation. Electronically Signed   By: Marlaine Hind M.D.   On: 12/01/2019 10:34    Medications:   . levETIRAcetam  500 mg Oral BID  . levothyroxine  88 mcg Oral Q0600  . lisinopril  2.5 mg Oral Daily  . magnesium oxide  400 mg Oral Daily  . mirtazapine  7.5 mg Oral QHS  . nystatin  1 application Topical BID  . risperiDONE  0.5 mg Oral QHS  . simvastatin  20 mg Oral QHS   Continuous Infusions: . sodium chloride 75 mL/hr at 12/02/19 1305  . cefTRIAXone (ROCEPHIN)  IV Stopped (12/02/19 1215)     LOS: 1 day   Zarianna Dicarlo  [facility]     12/02/2019, 2:35 PM

## 2019-12-03 DIAGNOSIS — E440 Moderate protein-calorie malnutrition: Secondary | ICD-10-CM

## 2019-12-03 DIAGNOSIS — N390 Urinary tract infection, site not specified: Principal | ICD-10-CM

## 2019-12-03 LAB — PROCALCITONIN: Procalcitonin: <0.1 ng/mL

## 2019-12-03 MED ORDER — ENSURE ENLIVE PO LIQD: 237.0000 mL | Freq: Two times a day (BID) | ORAL | Status: DC

## 2019-12-03 NOTE — Progress Notes (Signed)
Initial Nutrition Assessment  DOCUMENTATION CODES:   Non-severe (moderate) malnutrition in context of chronic illness  INTERVENTION:  Will downgrade diet to dysphagia 3 (mechanical soft).  Provide Ensure Enlive po BID, each supplement provides 350 kcal and 20 grams of protein. Patient prefers chocolate.  Provide Magic cup BID with lunch and dinner, each supplement provides 290 kcal and 9 grams of protein. Patient prefers chocolate.  NUTRITION DIAGNOSIS:   Moderate Malnutrition related to chronic illness as evidenced by moderate fat depletion, moderate muscle depletion, severe muscle depletion.  GOAL:   Patient will meet greater than or equal to 90% of their needs  MONITOR:   PO intake, Supplement acceptance, Labs, Weight trends, I & O's  REASON FOR ASSESSMENT:   Malnutrition Screening Tool    ASSESSMENT:   86 year old female with PMHx of dementia, hypothyroidism, HTN, seizure disorder admitted with acute metabolic encephalopathy, UTI.   Met with patient but she was unable to provide any history in setting of dementia. Per review of chart patient is from [facility] memory care unit. No paper chart available from facility so unable to review what diet patient is typically on. No family members at bedside at time of RD assessment. Attempted to call patient's daughter Evani Shrider over the phone but did not get an answer and left a voicemail requesting a call back. Able to reach patient's other daughter Andreas Newport over the phone. She reports patient has had a decreased appetite lately and they have been working on improving intake. She reports she ate better at dinner yesterday but is not eating as much today. According to chart patient ate 0% of her breakfast this morning. She had taken bites of her lunch. Daughter reports patient needs a mechanical soft diet. Will downgrade to this diet as patient is ordered for GI soft diet (low in fiber). Patient drinks chocolate Boost at her  facility.  Daughter reports patient has not been losing weight. According to chart patient appears fairly weight-stable. She was 54-55 kg since 2019. Weight of 63.5 kg on 11/03/2019 is likely incorrect as it is significantly higher than recent weights. Patient is currently 58.5 kg (128.97 lbs).  Medications reviewed and include: Keppra, levothyroxine, lisinopril, magnesium oxide 400 mg daily, Remeron 7.5 mg daily, NS at 75 mL/hr, ceftriaxone.  Labs reviewed.  Attempted to discuss with RN while on floor and then later over the phone several times but RN was unavailable.  NUTRITION - FOCUSED PHYSICAL EXAM:    Most Recent Value  Orbital Region  Moderate depletion  Upper Arm Region  Severe depletion  Thoracic and Lumbar Region  Moderate depletion  Buccal Region  Moderate depletion  Temple Region  Moderate depletion  Clavicle Bone Region  Severe depletion  Clavicle and Acromion Bone Region  Severe depletion  Scapular Bone Region  Unable to assess  Dorsal Hand  Severe depletion  Patellar Region  Moderate depletion  Anterior Thigh Region  Moderate depletion  Posterior Calf Region  Severe depletion  Edema (RD Assessment)  Mild  Hair  Reviewed  Eyes  Reviewed  Mouth  Reviewed  Skin  Reviewed [ecchymosis]  Nails  Reviewed     Diet Order:   Diet Order            DIET SOFT Room service appropriate? Yes; Fluid consistency: Thin  Diet effective now             EDUCATION NEEDS:   No education needs have been identified at this time  Skin:  Skin Assessment: Reviewed RN Assessment(ecchymosis)  Last BM:  Unknown  Height:   Ht Readings from Last 1 Encounters:  12/01/19 '5\' 7"'  (1.702 m)   Weight:   Wt Readings from Last 1 Encounters:  12/01/19 58.5 kg   Ideal Body Weight:  61.4 kg  BMI:  Body mass index is 20.2 kg/m.  Estimated Nutritional Needs:   Kcal:  1500-1700  Protein:  75-85 grams  Fluid:  1.5-1.7 L/day  Jacklynn Barnacle, MS, RD, LDN Pager number available on  Amion

## 2019-12-03 NOTE — Discharge Summary (Addendum)
Physician Discharge Summary  [name] [id number] DOB: 1933/03/08 DOA: 12/01/2019  PCP: Misty Ruths, MD  Admit date: 12/01/2019 Discharge date: 12/03/2019  Discharge disposition: Skilled nursing facility   Recommendations for Outpatient Follow-Up:   Outpatient follow-up with hospice team at the nursing facility.  Discharge Diagnosis:   Principal Problem:   Unresponsiveness Active Problems:   Alzheimer's type dementia with late onset with behavioral disturbance (HCC)   Abnormal urinalysis   Seizures (HCC)   Essential hypertension   Malnutrition of moderate degree   Acute lower UTI    Discharge Condition: Stable.  Diet recommendation: Dysphagia 3 diet (heart healthy diet)  Code status: DNR.    Hospital Course:   [name] is an 86 y.o. female with medical history significant fordementia, hypothyroidism and hypertension who was sent from the skilled nursing facility for evaluation of change in mental status. Patient has had episodes like this in the past but the facility staff got concerned because this episode lasted longer than her prior episodes. She was also said to have had an episode of epistaxis prior to this event. In the ED, patient was hypothermic with temperature of 91.2 F per rectum and axillary temperature was 92.8 F. She was admitted to the hospital for unresponsiveness and probable UTI.  Her daughter said she has a vaginal pessary in place.  She was treated with IV Rocephin for 3 days.  Urine culture did not show any growth.  She was evaluated by the speech therapist and she was prescribed dysphagia 3 diet for dysphagia.  She was also seen by the dietitian for moderate protein calorie malnutrition.  Patient is deemed medically stable for discharge to SNF today.  Discharge plan was discussed with her daughter, Misty Carroll, with a healthcare power of attorney.  She requested evaluation by the hospice team.  Unfortunately, because it is  [facility] there is no hospice staff available.  Case manager,Deliliah, was notified and she has made arrangement for patient to be seen at the skilled nursing facility.      Discharge Exam:   Vitals:   12/03/19 0759 12/03/19 1315  BP: (!) 135/56 129/61  Pulse: 80 75  Resp: 18 15  Temp: 97.8 F (36.6 C) 97.9 F (36.6 C)  SpO2: 99% 95%   Vitals:   12/03/19 0325 12/03/19 0507 12/03/19 0759 12/03/19 1315  BP: (!) 119/48 (!) 148/102 (!) 135/56 129/61  Pulse: 79 100 80 75  Resp: 18 16 18 15   Temp: 97.7 F (36.5 C) 98.3 F (36.8 C) 97.8 F (36.6 C) 97.9 F (36.6 C)  TempSrc: Axillary Oral Oral Oral  SpO2: 95% 95% 99% 95%  Weight:      Height:         GEN: NAD SKIN: Warm and dry EYES: No pallor or icterus ENT: MMM CV: RRR PULM: CTA B ABD: soft, ND, NT, +BS CNS: Sleepy but arousable, confused EXT: No edema or tenderness      The results of significant diagnostics from this hospitalization (including imaging, microbiology, ancillary and laboratory) are listed below for reference.     Procedures and Diagnostic Studies:   CT Head Wo Contrast  Result Date: 12/01/2019 CLINICAL DATA:  Staff state pt suffered nose bleed while walking down the hallway and upon sitting became altered. EXAM: CT HEAD WITHOUT CONTRAST TECHNIQUE: Contiguous axial images were obtained from the base of the skull through the vertex without intravenous contrast. COMPARISON:  Stat CT head 11/03/2019 FINDINGS: Brain: No evidence  of acute infarction, hemorrhage, hydrocephalus, extra-axial collection or mass lesion/mass effect. Global atrophy. Chronic microvascular ischemic changes. Chronic encephalomalacia in the anterior left temporal lobe at the site of prior hemorrhage. Vascular: No hyperdense vessel or unexpected calcification. Skull: Normal. Negative for fracture or focal lesion. Sinuses/Orbits: No acute finding. Other: None. IMPRESSION: 1. No acute intracranial pathology. 2. Global atrophy and  chronic microvascular ischemic changes. Electronically Signed   By: Audie Pinto M.D.   On: 12/01/2019 11:09   DG Chest Portable 1 View  Result Date: 12/01/2019 CLINICAL DATA:  Epistaxis.  Altered mental status. EXAM: PORTABLE CHEST 1 VIEW COMPARISON:  11/03/2019 FINDINGS: Heart size is within normal limits and stable. Aortic atherosclerosis noted. Central peribronchial thickening and bilateral upper lobe scarring are stable. No evidence of acute infiltrate or edema. No evidence of pneumothorax or pleural effusion. New radiodensity is seen at the level of thoracic inlet, just to the left side of the trachea. This was not seen on previous study and could be due to calcification or foreign body. IMPRESSION: 1. No acute cardiopulmonary disease. 2. Radiodensity at level of thoracic inlet, not seen on previous study. This could be due to calcification or foreign body. Recommend clinical correlation, and consider chest CT without contrast for further evaluation. Electronically Signed   By: Marlaine Hind M.D.   On: 12/01/2019 10:34     Labs:   Basic Metabolic Panel: Recent Labs  Lab 12/01/19 1002 12/02/19 0416  NA 142 144  K 3.9 3.5  CL 102 106  CO2 30 31  GLUCOSE 94 65*  BUN 19 18  CREATININE 0.57 0.67  CALCIUM 9.7 9.1   GFR Estimated Creatinine Clearance: 47.5 mL/min (by C-G formula based on SCr of 0.67 mg/dL). Liver Function Tests: Recent Labs  Lab 12/01/19 1002  AST 33  ALT 29  ALKPHOS 97  BILITOT 0.7  PROT 7.0  ALBUMIN 3.8   No results for input(s): LIPASE, AMYLASE in the last 168 hours. No results for input(s): AMMONIA in the last 168 hours. Coagulation profile No results for input(s): INR, PROTIME in the last 168 hours.  CBC: Recent Labs  Lab 12/01/19 1002 12/02/19 0416  WBC 4.9 5.8  NEUTROABS 3.7  --   HGB 12.0 11.2*  HCT 36.4 34.1*  MCV 97.1 96.6  PLT 148* 154   Cardiac Enzymes: No results for input(s): CKTOTAL, CKMB, CKMBINDEX, TROPONINI in the last 168  hours. BNP: Invalid input(s): POCBNP CBG: No results for input(s): GLUCAP in the last 168 hours. D-Dimer No results for input(s): DDIMER in the last 72 hours. Hgb A1c No results for input(s): HGBA1C in the last 72 hours. Lipid Profile No results for input(s): CHOL, HDL, LDLCALC, TRIG, CHOLHDL, LDLDIRECT in the last 72 hours. Thyroid function studies No results for input(s): TSH, T4TOTAL, T3FREE, THYROIDAB in the last 72 hours.  Invalid input(s): FREET3 Anemia work up No results for input(s): VITAMINB12, FOLATE, FERRITIN, TIBC, IRON, RETICCTPCT in the last 72 hours. Microbiology Recent Results (from the past 240 hour(s))  Culture, blood (routine x 2)     Status: None (Preliminary result)   Collection Time: 12/01/19  9:59 AM   Specimen: BLOOD  Result Value Ref Range Status   Specimen Description BLOOD RIGHT ANTECUBITAL  Final   Special Requests   Final    BOTTLES DRAWN AEROBIC AND ANAEROBIC Blood Culture adequate volume   Culture   Final    NO GROWTH 2 DAYS Performed at [facility], [place], [place]  [address]    Report Status PENDING  Incomplete  Culture, blood (routine x 2)     Status: None (Preliminary result)   Collection Time: 12/01/19  9:59 AM   Specimen: BLOOD  Result Value Ref Range Status   Specimen Description BLOOD BLOOD LEFT FOREARM  Final   Special Requests   Final    BOTTLES DRAWN AEROBIC AND ANAEROBIC Blood Culture adequate volume   Culture   Final    NO GROWTH 2 DAYS Performed at [facility], [address], [place], [place] [address]    Report Status PENDING  Incomplete  Urine culture     Status: None   Collection Time: 12/01/19 12:09 PM   Specimen: Urine, Random  Result Value Ref Range Status   Specimen Description   Final    URINE, RANDOM Performed at [facility], [address] [place], [place] [address]    Special Requests   Final    NONE Performed at [facility], [address],  [place], [place] [address]    Culture   Final    NO GROWTH Performed at [facility], [place] [address], [place], [place] [address]    Report Status 12/02/2019 FINAL  Final  SARS CORONAVIRUS 2 (TAT 6-24 HRS) Nasopharyngeal Nasopharyngeal Swab     Status: None   Collection Time: 12/01/19  2:10 PM   Specimen: Nasopharyngeal Swab  Result Value Ref Range Status   SARS Coronavirus 2 NEGATIVE NEGATIVE Final    Comment: (NOTE) SARS-CoV-2 target nucleic acids are NOT DETECTED. The SARS-CoV-2 RNA is generally detectable in upper and lower respiratory specimens during the acute phase of infection. Negative results do not preclude SARS-CoV-2 infection, do not rule out co-infections with other pathogens, and should not be used as the sole basis for treatment or other patient management decisions. Negative results must be combined with clinical observations, patient history, and epidemiological information. The expected result is Negative. Fact Sheet for Patients: SugarRoll.be Fact Sheet for Healthcare Providers: https://www.woods-mathews.com/ This test is not yet approved or cleared by the [place] FDA and  has been authorized for detection and/or diagnosis of SARS-CoV-2 by FDA under an Emergency Use Authorization (EUA). This EUA will remain  in effect (meaning this test can be used) for the duration of the COVID-19 declaration under Section 56 4(b)(1) of the Act, 21 U.S.C. section 360bbb-3(b)(1), unless the authorization is terminated or revoked sooner. Performed at [facility], [place] [address], [place], [place] [address]   MRSA PCR Screening     Status: None   Collection Time: 12/02/19  3:57 AM   Specimen: Nasal Mucosa; Nasopharyngeal  Result Value Ref Range Status   MRSA by PCR NEGATIVE NEGATIVE Final    Comment:        The GeneXpert MRSA Assay (FDA approved for NASAL specimens only), is one component of a comprehensive MRSA  colonization surveillance program. It is not intended to diagnose MRSA infection nor to guide or monitor treatment for MRSA infections. Performed at [facility], [address] [place], [place] [address]      Discharge Instructions:   Discharge Instructions    DIET DYS 3   Complete by: As directed    Fluid consistency: Thin   Discharge instructions   Complete by: As directed    Follow up with hospice team at the nursing home   Increase activity slowly   Complete by: As directed      Allergies as of  12/03/2019      Reactions   Donepezil Other (See Comments)   Made her feel funny.       Medication List    TAKE these medications   acetaminophen 325 MG tablet Commonly known as: TYLENOL Take 650 mg by mouth every 6 (six) hours as needed for mild pain or fever. What changed: Another medication with the same name was removed. Continue taking this medication, and follow the directions you see here.   Desitin 13 % Crea Generic drug: Zinc Oxide Apply 1 application topically 2 (two) times daily as needed (redness). (apply to peri-area)   Keppra 500 MG tablet Generic drug: levETIRAcetam Take 500 mg by mouth 2 (two) times daily.   levothyroxine 88 MCG tablet Commonly known as: SYNTHROID Take 88 mcg by mouth daily before breakfast.   lisinopril 2.5 MG tablet Commonly known as: ZESTRIL Take 2.5 mg by mouth daily.   magnesium oxide 400 MG tablet Commonly known as: MAG-OX Take 400 mg by mouth daily.   mirtazapine 15 MG tablet Commonly known as: REMERON Take 7.5 mg by mouth at bedtime.   nystatin powder Generic drug: nystatin Apply 1 application topically 2 (two) times daily. (apply to skin folds and groin area)   risperiDONE 0.5 MG tablet Commonly known as: RISPERDAL Take 0.5 mg by mouth at bedtime.   Zocor 20 MG tablet Generic drug: simvastatin Take 20 mg by mouth at bedtime.         Time coordinating discharge: 33  minutes  Signed:  Emma Schupp  [facility] 12/03/2019, 2:43 PM

## 2019-12-03 NOTE — Progress Notes (Signed)
MD order received in [facility] to discharge pt to SNF today; TOC previously established discharge packet for EMS personnel to take to the facility; [facility] RN contacted [facility] for discharge, no report to be called to the facility; discharge packet given to [facility] [place] to take to the facility; pt discharged via stretcher back to [facility] by [facility]

## 2019-12-03 NOTE — TOC Progression Note (Addendum)
Transition of Care [facility]) - Progression Note    Patient Details  Name: Misty Carroll MRN: [id number] Date of Birth: [birth year]  Transition [facility]) CM/SW [place], RN Phone Number: 12/03/2019, 2:33 PM  Clinical Narrative:    Met with the patient and her daughter in the room They want to have [place] follow the patient , I called the [place] with [facility] and left a secure VM asking that they follow the patient outpatient at [place] called [place] [facility] and left a VM for Samantha to call back Spoke with [place] and she walked back to the memory care to get someone on the phone The nurse at [facility] came to the phone and agreed to accept the patient coming back today         Expected Discharge Plan and Services                                                 Social Determinants of Health (SDOH) Interventions    Readmission Risk Interventions No flowsheet data found.

## 2019-12-03 NOTE — Plan of Care (Signed)
  Problem: Clinical Measurements: Goal: Respiratory complications will improve Outcome: Progressing Goal: Cardiovascular complication will be avoided Outcome: Progressing   Problem: Activity: Goal: Risk for activity intolerance will decrease Outcome: Progressing   Problem: Coping: Goal: Level of anxiety will decrease Outcome: Progressing   Problem: Elimination: Goal: Will not experience complications related to bowel motility Outcome: Progressing Goal: Will not experience complications related to urinary retention Outcome: Progressing   Problem: Pain Managment: Goal: General experience of comfort will improve Outcome: Progressing   Problem: Safety: Goal: Ability to remain free from injury will improve Outcome: Progressing   Problem: Skin Integrity: Goal: Risk for impaired skin integrity will decrease Outcome: Progressing   

## 2019-12-03 NOTE — Progress Notes (Signed)
Telephone call to [facility] [phone] questioning the pt's last bowel movement; unable to give date at this time

## 2019-12-03 NOTE — TOC Transition Note (Signed)
Transition of Care [facility]) - CM/SW Discharge Note   Patient Details  Name: Misty Carroll MRN: [id number] Date of Birth: Aug 29, 1932  Transition of Care [facility]) CM/SW Contact:  Su Hilt, RN Phone Number: 12/03/2019, 3:45 PM   Clinical Narrative:     The patient is to Discharge to memory care at [place] packet is on the chart, The bedside nurse is getting the patient ready to discharge, The daughter is in the room and is aware of the DC, [facility] is going to see the patient outpatient at [facility], no additional needs, RNCM has called EMS to transport        Patient Goals and CMS Choice        Discharge Placement                       Discharge Plan and Services                                     Social Determinants of Health (SDOH) Interventions     Readmission Risk Interventions No flowsheet data found.

## 2019-12-03 NOTE — NC FL2 (Addendum)
[place] LEVEL OF CARE SCREENING TOOL     IDENTIFICATION  Patient Name: Misty Carroll Birthdate: 10-Oct-1932 Sex: female Admission Date (Current Location): 12/01/2019  [place] and [place] Number:  Engineering geologist and Address:  [facility], [address] [place], [place] [address]      Provider Number: [id number]  Attending Physician Name and Address:  Jennye Boroughs, MD  Relative Name and Phone Number:  [name] Daughger [phone]    Current Level of Care: Hospital Recommended Level of Care: Memory Care Prior Approval Number:    Date Approved/Denied:   PASRR Number: [id number]  Discharge Plan: Other (Comment)(Memory Care)    Current Diagnoses: Patient Active Problem List   Diagnosis Date Noted  . Malnutrition of moderate degree 12/03/2019  . Acute lower UTI 12/03/2019  . Unresponsiveness 12/01/2019  . Seizures ([place]) 12/01/2019  . Essential hypertension 12/01/2019  . Alzheimer's type dementia with late onset with behavioral disturbance ([place])   . Abnormal urinalysis   . Hyperglycemia   . Orthostasis   . Hyponatremia   . Sleep disturbance   . Closed nondisplaced intertrochanteric fracture of left femur ([place])   . Labile blood pressure   . Hypokalemia   . Acute blood loss anemia   . Hypoalbuminemia due to protein-calorie malnutrition ([place])   . Traumatic subdural hematoma ([place]) 06/16/2018  . Fall 06/13/2018  . Uterine prolapse 01/19/2017  . Cystocele, midline 01/19/2017  . Cystocele with small rectocele and uterine descent 09/03/2016  . Uterovaginal prolapse, incomplete 09/03/2016  . Left-sided epistaxis 01/25/2013    Orientation RESPIRATION BLADDER Height & Weight     (disoriented)  Normal Incontinent Weight: 58.5 kg Height:  5\' 7"  (170.2 cm)  BEHAVIORAL SYMPTOMS/MOOD NEUROLOGICAL BOWEL NUTRITION STATUS      Incontinent Diet(Dysphagia 3)  AMBULATORY STATUS COMMUNICATION OF NEEDS Skin   Extensive Assist  Verbally Normal                       Personal Care Assistance Level of Assistance  Dressing, Bathing Bathing Assistance: Maximum assistance   Dressing Assistance: Maximum assistance     Functional Limitations Info             SPECIAL CARE FACTORS FREQUENCY                       Contractures Contractures Info: Not present    Additional Factors Info  Code Status, Allergies Code Status Info: DNR Allergies Info: Donepezil           Current Medications (12/03/2019):  This is the current hospital active medication list Current Facility-Administered Medications  Medication Dose Route Frequency Provider Last Rate Last Admin  . 0.9 %  sodium chloride infusion   Intravenous Continuous Agbata, Tochukwu, MD 75 mL/hr at 12/03/19 1154 Rate Verify at 12/03/19 1154  . cefTRIAXone (ROCEPHIN) 1 g in sodium chloride 0.9 % 100 mL IVPB  1 g Intravenous Q24H Lang Snow, NP 200 mL/hr at 12/03/19 1155 1 g at 12/03/19 1155  . enoxaparin (LOVENOX) injection 40 mg  40 mg Subcutaneous Q24H Jennye Boroughs, MD   40 mg at 12/02/19 2132  . feeding supplement (ENSURE ENLIVE) (ENSURE ENLIVE) liquid 237 mL  237 mL Oral BID BM Jennye Boroughs, MD      . levETIRAcetam (KEPPRA) tablet 500 mg  500 mg Oral BID Agbata, Tochukwu, MD   500 mg at 12/03/19 1159  . levothyroxine (SYNTHROID) tablet 88 mcg  88 mcg Oral Q0600 Collier Bullock, MD   88 mcg at 12/03/19 0554  . lisinopril (ZESTRIL) tablet 2.5 mg  2.5 mg Oral Daily Agbata, Tochukwu, MD   2.5 mg at 12/03/19 1159  . magnesium oxide (MAG-OX) tablet 400 mg  400 mg Oral Daily Agbata, Tochukwu, MD   400 mg at 12/03/19 1159  . mirtazapine (REMERON) tablet 7.5 mg  7.5 mg Oral QHS Agbata, Tochukwu, MD   7.5 mg at 12/02/19 2128  . nystatin (MYCOSTATIN/NYSTOP) topical powder 1 application  1 application Topical BID Agbata, Tochukwu, MD   1 application at [id number]  . ondansetron (ZOFRAN) tablet 4 mg  4 mg Oral Q6H PRN Agbata, Tochukwu, MD        Or  . ondansetron (ZOFRAN) injection 4 mg  4 mg Intravenous Q6H PRN Agbata, Tochukwu, MD      . risperiDONE (RISPERDAL) tablet 0.5 mg  0.5 mg Oral QHS Agbata, Tochukwu, MD   0.5 mg at 12/02/19 2129  . simvastatin (ZOCOR) tablet 20 mg  20 mg Oral QHS Agbata, Tochukwu, MD   20 mg at 12/02/19 2129  . zinc oxide (BALMEX) 99991111 % cream 1 application  1 application Topical BID PRN Agbata, Tochukwu, MD         Discharge Medications: Medication List    TAKE these medications   acetaminophen 325 MG tablet Commonly known as: TYLENOL Take 650 mg by mouth every 6 (six) hours as needed for mild pain or fever. What changed: Another medication with the same name was removed. Continue taking this medication, and follow the directions you see here.   Desitin 13 % Crea Generic drug: Zinc Oxide Apply 1 application topically 2 (two) times daily as needed (redness). (apply to peri-area)   Keppra 500 MG tablet Generic drug: levETIRAcetam Take 500 mg by mouth 2 (two) times daily.   levothyroxine 88 MCG tablet Commonly known as: SYNTHROID Take 88 mcg by mouth daily before breakfast.   lisinopril 2.5 MG tablet Commonly known as: ZESTRIL Take 2.5 mg by mouth daily.   magnesium oxide 400 MG tablet Commonly known as: MAG-OX Take 400 mg by mouth daily.   mirtazapine 15 MG tablet Commonly known as: REMERON Take 7.5 mg by mouth at bedtime.   nystatin powder Generic drug: nystatin Apply 1 application topically 2 (two) times daily. (apply to skin folds and groin area)   risperiDONE 0.5 MG tablet Commonly known as: RISPERDAL Take 0.5 mg by mouth at bedtime.   Zocor 20 MG tablet Generic drug: simvastatin Take 20 mg by mouth at bedtime.       Please see discharge summary for a list of discharge medications.  Relevant Imaging Results:  Relevant Lab Results:   Additional Information SSN: [id number]  Su Hilt, RN

## 2019-12-06 LAB — CULTURE, BLOOD (ROUTINE X 2)
Culture: NO GROWTH
Culture: NO GROWTH
Special Requests: ADEQUATE
Special Requests: ADEQUATE

## 2020-08-08 IMAGING — CT CT HEAD W/O CM
3 series · 16 of 47 positions shown, 19 images · non-contrast
Comparison: Stat CT head 11/03/2019

CLINICAL DATA: Staff state pt suffered nose bleed while walking
down the hallway and upon sitting became altered.

EXAM:
CT HEAD WITHOUT CONTRAST
TECHNIQUE: Contiguous axial images were obtained from the base of the skull
through the vertex without intravenous contrast.

[Series 3: head wo · axial · 0.42mm/px · z∈[-150,-25]mm · 10 of 31 slices shown, 13 images]
[im 3/31  brain]
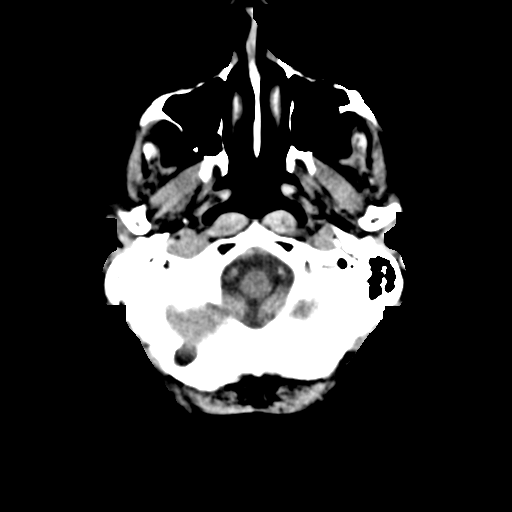
[im 3/31  bone]
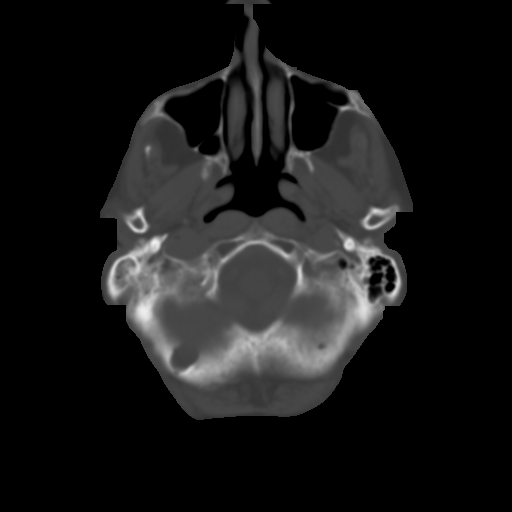
[im 6/31  brain]
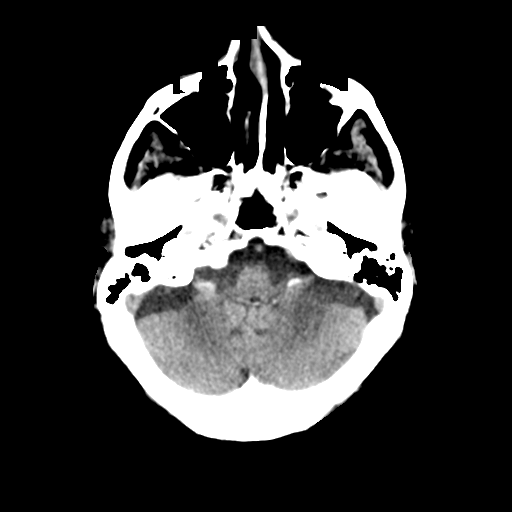
[im 9/31  brain]
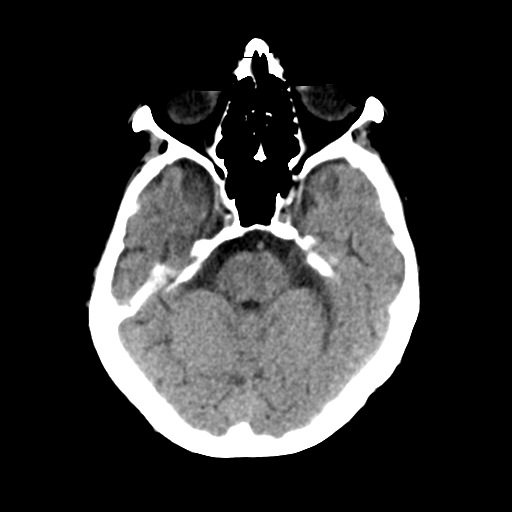
[im 11/31  brain]
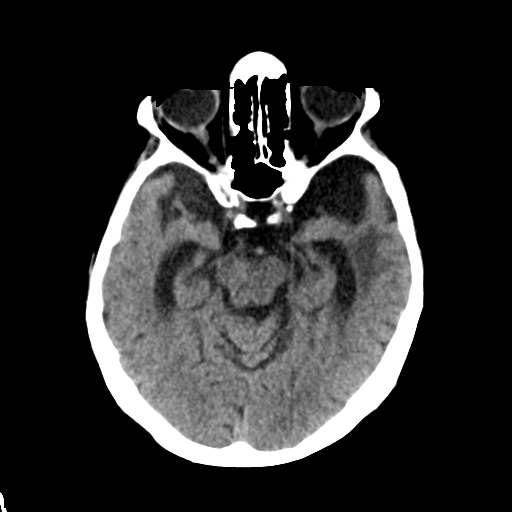
[im 14/31  brain]
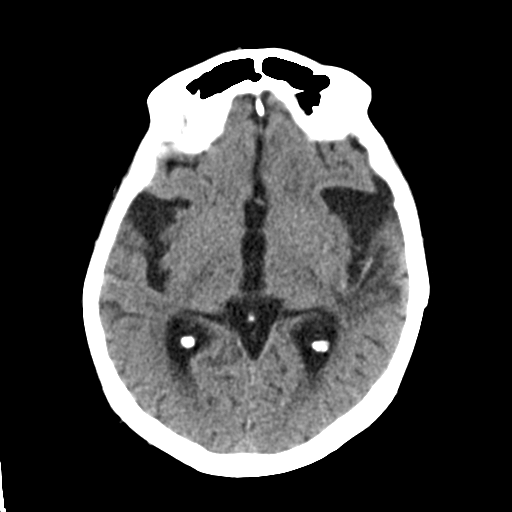
[im 14/31  bone]
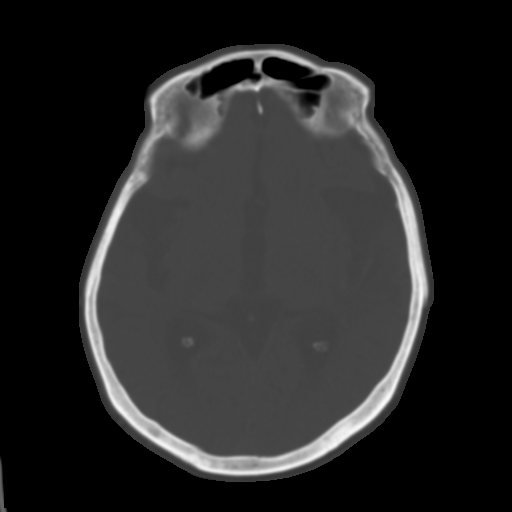
[im 17/31  brain]
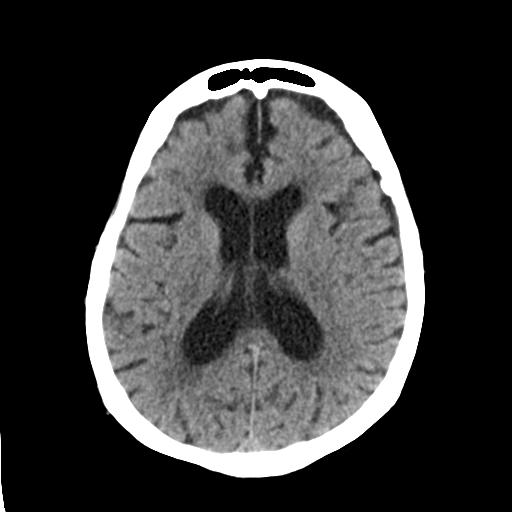
[im 20/31  brain]
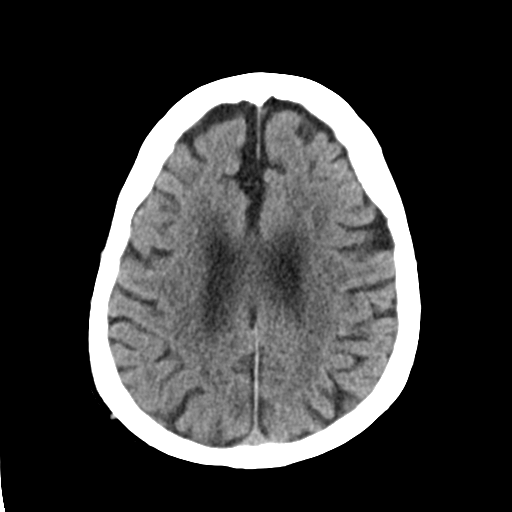
[im 23/31  brain]
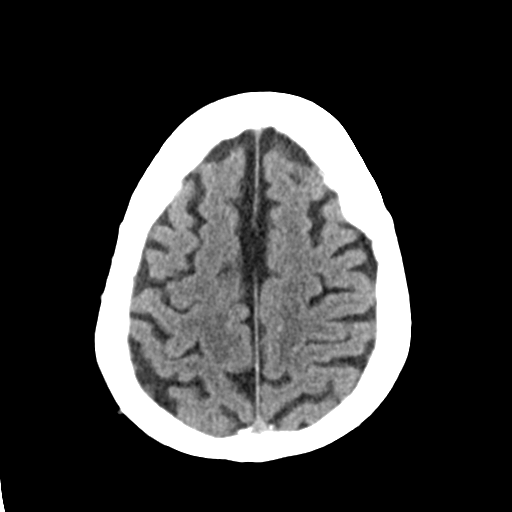
[im 25/31  brain]
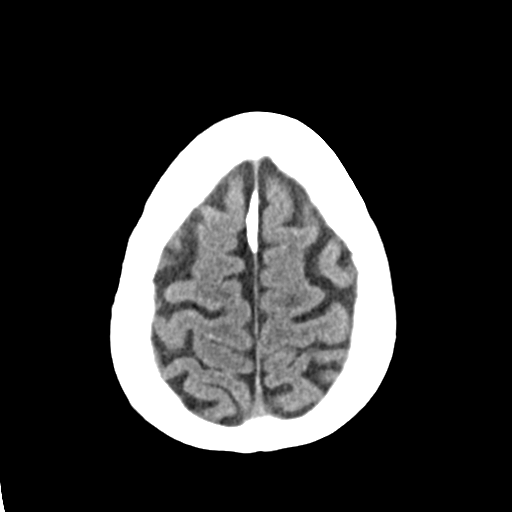
[im 25/31  bone]
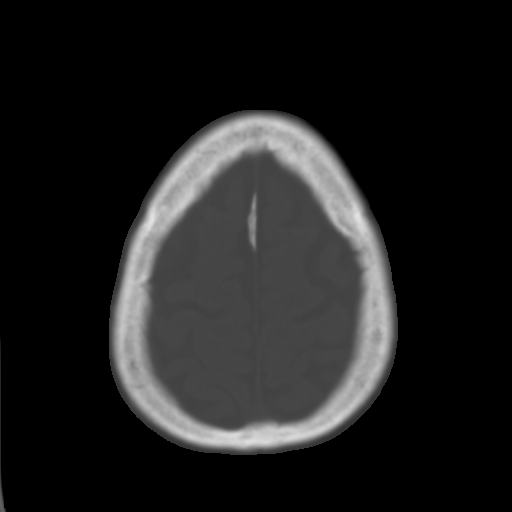
[im 28/31  brain]
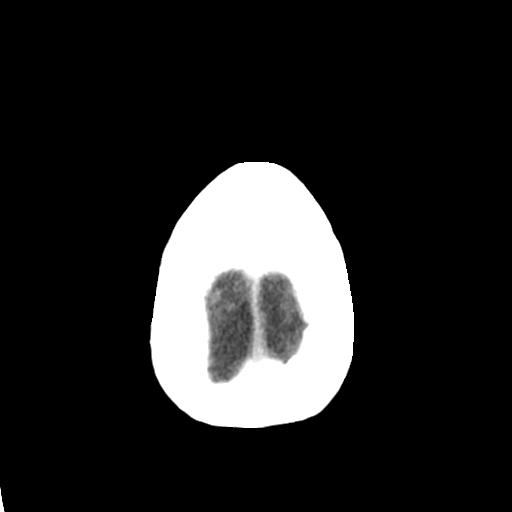

[Series 4: coronal soft tissue · coronal · 0.30mm/px · 3 of 59 slices shown]
[im 20/59  brain]
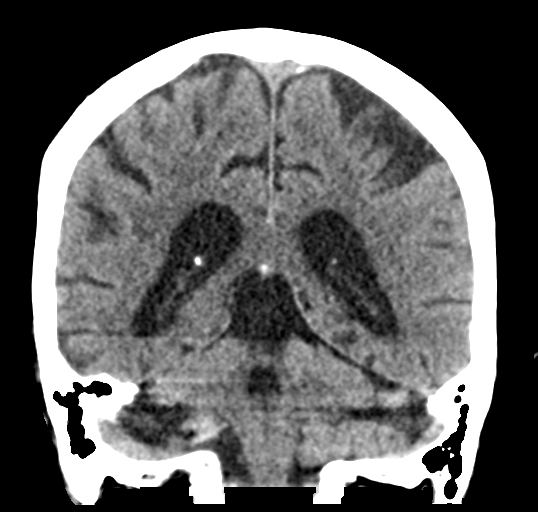
[im 26/59  brain]
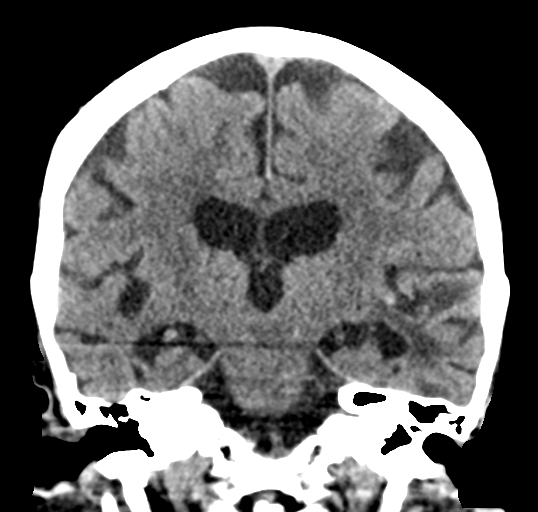
[im 33/59  brain]
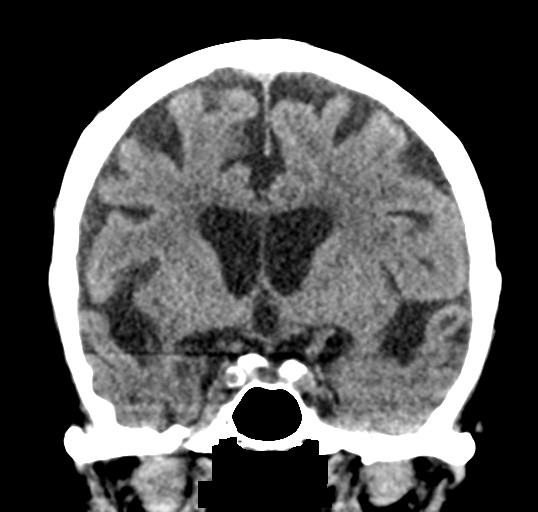

[Series 5: sagittal soft tissue · sagittal · 0.31mm/px · 3 of 51 slices shown]
[im 17/51  brain]
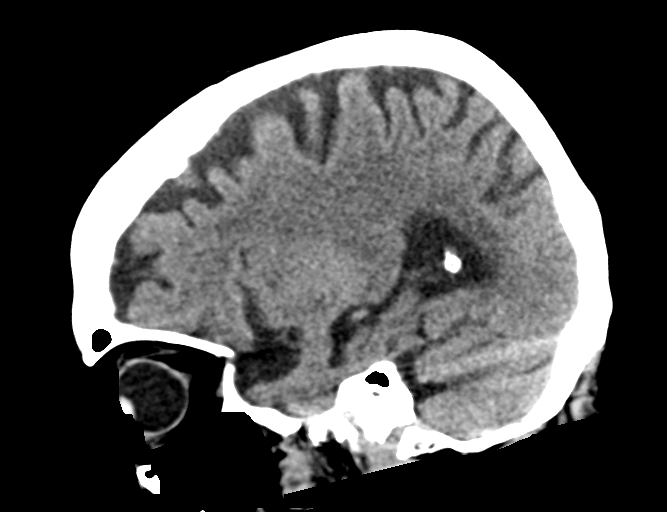
[im 26/51  brain]
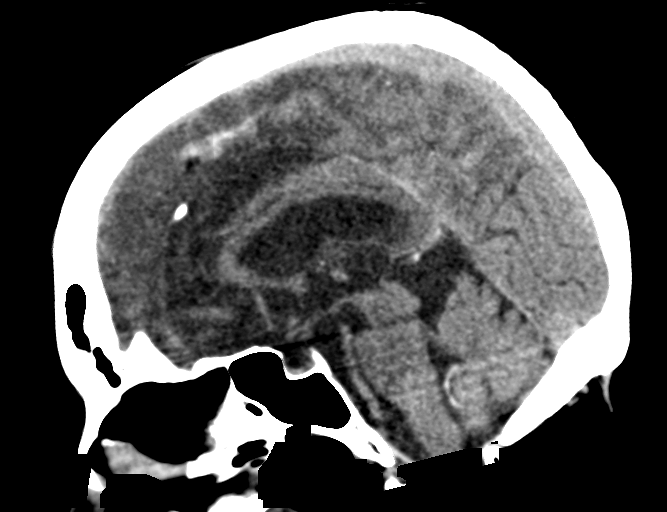
[im 34/51  brain]
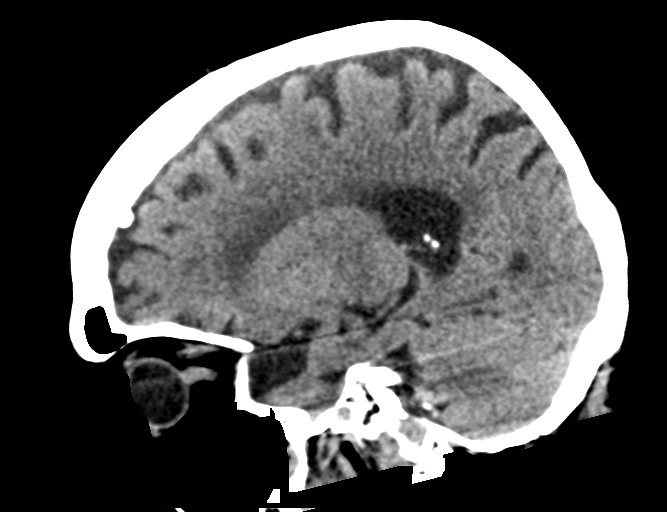

[16 of 47 positions shown; findings below may reference images not displayed]

FINDINGS: Brain: No evidence of acute infarction, hemorrhage, hydrocephalus,
extra-axial collection or mass lesion/mass effect. Global atrophy.
Chronic microvascular ischemic changes. Chronic encephalomalacia in
the anterior left temporal lobe at the site of prior hemorrhage.

Vascular: No hyperdense vessel or unexpected calcification.

Skull: Normal. Negative for fracture or focal lesion.

Sinuses/Orbits: No acute finding.

Other: None.
IMPRESSION: 1. No acute intracranial pathology.
2. Global atrophy and chronic microvascular ischemic changes.

## 2022-09-16 ENCOUNTER — Inpatient Hospital Stay
Admission: EM | Admit: 2022-09-16 | Discharge: 2022-09-20 | DRG: 690 | Disposition: A | Discharge status: Hospice | Payer: Medicare Other | Source: Skilled Nursing Facility | Attending: Internal Medicine | Admitting: Internal Medicine

## 2022-09-16 ENCOUNTER — Emergency Department: Payer: Medicare Other

## 2022-09-16 ENCOUNTER — Other Ambulatory Visit: Payer: Self-pay

## 2022-09-16 DIAGNOSIS — Z79890 Hormone replacement therapy (postmenopausal): Secondary | ICD-10-CM | POA: Diagnosis not present

## 2022-09-16 DIAGNOSIS — E7800 Pure hypercholesterolemia, unspecified: Secondary | ICD-10-CM | POA: Diagnosis present

## 2022-09-16 DIAGNOSIS — W19XXXA Unspecified fall, initial encounter: Principal | ICD-10-CM

## 2022-09-16 DIAGNOSIS — R5381 Other malaise: Secondary | ICD-10-CM | POA: Diagnosis present

## 2022-09-16 DIAGNOSIS — Z8701 Personal history of pneumonia (recurrent): Secondary | ICD-10-CM

## 2022-09-16 DIAGNOSIS — E876 Hypokalemia: Secondary | ICD-10-CM | POA: Diagnosis present

## 2022-09-16 DIAGNOSIS — F02818 Dementia in other diseases classified elsewhere, unspecified severity, with other behavioral disturbance: Secondary | ICD-10-CM | POA: Diagnosis present

## 2022-09-16 DIAGNOSIS — Z85828 Personal history of other malignant neoplasm of skin: Secondary | ICD-10-CM | POA: Diagnosis not present

## 2022-09-16 DIAGNOSIS — G309 Alzheimer's disease, unspecified: Secondary | ICD-10-CM

## 2022-09-16 DIAGNOSIS — Y92092 Bedroom in other non-institutional residence as the place of occurrence of the external cause: Secondary | ICD-10-CM | POA: Diagnosis not present

## 2022-09-16 DIAGNOSIS — Z888 Allergy status to other drugs, medicaments and biological substances status: Secondary | ICD-10-CM

## 2022-09-16 DIAGNOSIS — F0280 Dementia in other diseases classified elsewhere without behavioral disturbance: Secondary | ICD-10-CM | POA: Diagnosis not present

## 2022-09-16 DIAGNOSIS — E039 Hypothyroidism, unspecified: Secondary | ICD-10-CM | POA: Diagnosis present

## 2022-09-16 DIAGNOSIS — I10 Essential (primary) hypertension: Secondary | ICD-10-CM | POA: Diagnosis present

## 2022-09-16 DIAGNOSIS — G301 Alzheimer's disease with late onset: Secondary | ICD-10-CM | POA: Diagnosis not present

## 2022-09-16 DIAGNOSIS — Z79899 Other long term (current) drug therapy: Secondary | ICD-10-CM | POA: Diagnosis not present

## 2022-09-16 DIAGNOSIS — N390 Urinary tract infection, site not specified: Secondary | ICD-10-CM | POA: Diagnosis not present

## 2022-09-16 DIAGNOSIS — G9340 Encephalopathy, unspecified: Secondary | ICD-10-CM | POA: Diagnosis not present

## 2022-09-16 DIAGNOSIS — G3289 Other specified degenerative disorders of nervous system in diseases classified elsewhere: Secondary | ICD-10-CM | POA: Diagnosis present

## 2022-09-16 DIAGNOSIS — R569 Unspecified convulsions: Secondary | ICD-10-CM | POA: Diagnosis present

## 2022-09-16 DIAGNOSIS — Z515 Encounter for palliative care: Secondary | ICD-10-CM | POA: Diagnosis not present

## 2022-09-16 DIAGNOSIS — Z66 Do not resuscitate: Secondary | ICD-10-CM | POA: Diagnosis present

## 2022-09-16 DIAGNOSIS — W06XXXA Fall from bed, initial encounter: Secondary | ICD-10-CM | POA: Diagnosis present

## 2022-09-16 DIAGNOSIS — Z9049 Acquired absence of other specified parts of digestive tract: Secondary | ICD-10-CM

## 2022-09-16 DIAGNOSIS — R410 Disorientation, unspecified: Secondary | ICD-10-CM | POA: Diagnosis present

## 2022-09-16 DIAGNOSIS — N3000 Acute cystitis without hematuria: Secondary | ICD-10-CM | POA: Diagnosis not present

## 2022-09-16 LAB — URINALYSIS, ROUTINE W REFLEX MICROSCOPIC
Bilirubin Urine: NEGATIVE
Glucose, UA: NEGATIVE mg/dL
Ketones, ur: NEGATIVE mg/dL
Nitrite: POSITIVE — AB
Protein, ur: 30 mg/dL — AB
Specific Gravity, Urine: 1.017 (ref 1.005–1.030)
Squamous Epithelial / HPF: NONE SEEN /HPF (ref 0–5)
pH: 6 (ref 5.0–8.0)

## 2022-09-16 LAB — CBC WITH DIFFERENTIAL/PLATELET
Abs Immature Granulocytes: 0.03 10*3/uL (ref 0.00–0.07)
Basophils Absolute: 0.1 10*3/uL (ref 0.0–0.1)
Basophils Relative: 1 %
Eosinophils Absolute: 0.2 10*3/uL (ref 0.0–0.5)
Eosinophils Relative: 3 %
HCT: 34.2 % — ABNORMAL LOW (ref 36.0–46.0)
Hemoglobin: 10.6 g/dL — ABNORMAL LOW (ref 12.0–15.0)
Immature Granulocytes: 0 %
Lymphocytes Relative: 7 %
Lymphs Abs: 0.6 10*3/uL — ABNORMAL LOW (ref 0.7–4.0)
MCH: 27.5 pg (ref 26.0–34.0)
MCHC: 31 g/dL (ref 30.0–36.0)
MCV: 88.8 fL (ref 80.0–100.0)
Monocytes Absolute: 0.6 10*3/uL (ref 0.1–1.0)
Monocytes Relative: 7 %
Neutro Abs: 6.7 10*3/uL (ref 1.7–7.7)
Neutrophils Relative %: 82 %
Platelets: 189 10*3/uL (ref 150–400)
RBC: 3.85 MIL/uL — ABNORMAL LOW (ref 3.87–5.11)
RDW: 20.4 % — ABNORMAL HIGH (ref 11.5–15.5)
WBC: 8.2 10*3/uL (ref 4.0–10.5)
nRBC: 0 % (ref 0.0–0.2)

## 2022-09-16 LAB — COMPREHENSIVE METABOLIC PANEL
ALT: 13 U/L (ref 0–44)
AST: 25 U/L (ref 15–41)
Albumin: 3.3 g/dL — ABNORMAL LOW (ref 3.5–5.0)
Alkaline Phosphatase: 77 U/L (ref 38–126)
Anion gap: 6 (ref 5–15)
BUN: 29 mg/dL — ABNORMAL HIGH (ref 8–23)
CO2: 27 mmol/L (ref 22–32)
Calcium: 8.8 mg/dL — ABNORMAL LOW (ref 8.9–10.3)
Chloride: 103 mmol/L (ref 98–111)
Creatinine, Ser: 0.84 mg/dL (ref 0.44–1.00)
GFR, Estimated: >60 mL/min (ref >60)
Glucose, Bld: 100 mg/dL — ABNORMAL HIGH (ref 70–99)
Potassium: 3.2 mmol/L — ABNORMAL LOW (ref 3.5–5.1)
Sodium: 136 mmol/L (ref 135–145)
Total Bilirubin: 0.5 mg/dL (ref 0.3–1.2)
Total Protein: 7.5 g/dL (ref 6.5–8.1)

## 2022-09-16 LAB — LACTIC ACID, PLASMA
Lactic Acid, Venous: 1.3 mmol/L (ref 0.5–1.9)
Lactic Acid, Venous: 0.9 mmol/L (ref 0.5–1.9)

## 2022-09-16 LAB — PROTIME-INR
INR: 1.2 (ref 0.8–1.2)
Prothrombin Time: 15.4 seconds — ABNORMAL HIGH (ref 11.4–15.2)

## 2022-09-16 LAB — AMMONIA: Ammonia: 27 umol/L (ref 9–35)

## 2022-09-16 MED ORDER — SODIUM CHLORIDE 0.9 % IV SOLN: INTRAVENOUS | Status: DC

## 2022-09-16 MED ORDER — MORPHINE SULFATE (PF) 2 MG/ML IV SOLN
2.0000 mg | Freq: Once | INTRAVENOUS | Status: DC
  Filled 2022-09-16: qty 1

## 2022-09-16 MED ORDER — HYDROCHLOROTHIAZIDE 25 MG PO TABS
25.0000 mg | ORAL_TABLET | Freq: Every day | ORAL | Status: DC
  Administered 2022-09-16 – 2022-09-18 (×3): 25 mg via ORAL
  Filled 2022-09-16 (×4): qty 1

## 2022-09-16 MED ORDER — ACETAMINOPHEN 160 MG/5ML PO SOLN
650.0000 mg | Freq: Once | ORAL | Status: AC
  Administered 2022-09-16: 650 mg via ORAL
  Filled 2022-09-16: qty 20.3

## 2022-09-16 MED ORDER — ONDANSETRON HCL 4 MG/2ML IJ SOLN: 4.0000 mg | Freq: Four times a day (QID) | INTRAMUSCULAR | Status: DC | PRN

## 2022-09-16 MED ORDER — ENOXAPARIN SODIUM 40 MG/0.4ML IJ SOSY
40.0000 mg | PREFILLED_SYRINGE | INTRAMUSCULAR | Status: DC
  Administered 2022-09-16 – 2022-09-19 (×4): 40 mg via SUBCUTANEOUS
  Filled 2022-09-16 (×4): qty 0.4

## 2022-09-16 MED ORDER — SODIUM CHLORIDE 0.9 % IV SOLN
2.0000 g | INTRAVENOUS | Status: DC
  Filled 2022-09-16: qty 20

## 2022-09-16 MED ORDER — FERROUS SULFATE 325 (65 FE) MG PO TABS
325.0000 mg | ORAL_TABLET | Freq: Every day | ORAL | Status: DC
  Administered 2022-09-17 – 2022-09-20 (×4): 325 mg via ORAL
  Filled 2022-09-16 (×4): qty 1

## 2022-09-16 MED ORDER — SODIUM CHLORIDE 0.9 % IV BOLUS
1000.0000 mL | Freq: Once | INTRAVENOUS | Status: AC
  Administered 2022-09-16: 1000 mL via INTRAVENOUS

## 2022-09-16 MED ORDER — ONDANSETRON HCL 4 MG PO TABS: 4.0000 mg | ORAL_TABLET | Freq: Four times a day (QID) | ORAL | Status: DC | PRN

## 2022-09-16 MED ORDER — FOLIC ACID 1 MG PO TABS
500.0000 ug | ORAL_TABLET | Freq: Every day | ORAL | Status: DC
  Administered 2022-09-16 – 2022-09-20 (×5): 0.5 mg via ORAL
  Filled 2022-09-16 (×5): qty 1

## 2022-09-16 MED ORDER — SODIUM CHLORIDE 0.9 % IV SOLN
2.0000 g | Freq: Once | INTRAVENOUS | Status: AC
  Administered 2022-09-16: 2 g via INTRAVENOUS
  Filled 2022-09-16 (×2): qty 20

## 2022-09-16 MED ORDER — VITAMIN D 25 MCG (1000 UNIT) PO TABS
1000.0000 [IU] | ORAL_TABLET | Freq: Every day | ORAL | Status: DC
  Administered 2022-09-16 – 2022-09-20 (×5): 1000 [IU] via ORAL
  Filled 2022-09-16 (×5): qty 1

## 2022-09-16 MED ORDER — LEVOTHYROXINE SODIUM 50 MCG PO TABS
50.0000 ug | ORAL_TABLET | Freq: Every day | ORAL | Status: DC
  Administered 2022-09-16 – 2022-09-20 (×4): 50 ug via ORAL
  Filled 2022-09-16 (×5): qty 1

## 2022-09-16 MED ORDER — ACETAMINOPHEN 325 MG PO TABS
650.0000 mg | ORAL_TABLET | Freq: Three times a day (TID) | ORAL | Status: DC
  Administered 2022-09-16 – 2022-09-18 (×4): 650 mg via ORAL
  Filled 2022-09-16 (×5): qty 2
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
## 2022-09-16 NOTE — Progress Notes (Signed)
OT Cancellation Note  Patient Details Name: Misty Carroll MRN: [id number] DOB: 1932-08-16   Cancelled Treatment:    Reason Eval/Treat Not Completed: Patient at procedure or test/ unavailable (pt being assisted by nursing)  Vania Rea 09/16/2022, 3:19 PM

## 2022-09-16 NOTE — Assessment & Plan Note (Signed)
Continue Keppra Check Keppra level

## 2022-09-16 NOTE — Assessment & Plan Note (Signed)
Acute decompensation and baseline dementia in setting of acute UTI and weakness CT head grossly stable Treat UTI Monitor

## 2022-09-16 NOTE — ED Notes (Signed)
Pt to CT

## 2022-09-16 NOTE — ED Triage Notes (Signed)
Pt arrived via [facility] from [facility] with c/o of fall. Per EMS pt slid out of the bed and EMS had to get her off the floor. Pt has a hx of dementia and per the facility she has been more altered the last 2 days. Pt did not have c/o of pain with EMS, but upon arrival to ED pt has complaints of left leg pain.

## 2022-09-16 NOTE — Assessment & Plan Note (Signed)
Fall at assisted living facility with weakness and confusion in the setting of UTI Plain films and CT imaging grossly negative for any acute fracture No reported head trauma or loss of consciousness Noted prior left hip fracture s/p repair appears stable Fall precautions PT OT evaluation Pain control Anticipate placement

## 2022-09-16 NOTE — Assessment & Plan Note (Signed)
BP stable Titrate home regimen 

## 2022-09-16 NOTE — Assessment & Plan Note (Signed)
Acute on chronic encephalopathy in setting of baseline Alzheimer's dementia with active UTI CT head stable Treat UTI Add on ammonia level Monitor mentation

## 2022-09-16 NOTE — ED Provider Notes (Signed)
[facility] Provider Note    Event Date/Time   First MD Initiated Contact with Patient 09/16/22 [id number]     (approximate)   History   Fall   HPI  Misty Carroll is a 89 y.o. female here with fall and altered mental status.  History provided primarily by the patient's facility.  Per report, the patient has dementia but was found down in her room today after suspected fall.  There is no bleeding at the time.  The patient smelled of urine.  She has been slightly more confused than her baseline.  No known recent sick contacts.  Remainder of history limited secondary to dementia and confusion.     Physical Exam   Triage Vital Signs: ED Triage Vitals [09/16/22 0728]  Enc Vitals Group     BP 133/64     Pulse Rate 98     Resp (!) 22     Temp 97.7 F (36.5 C)     Temp Source Oral     SpO2 97 %     Weight      Height      Head Circumference      Peak Flow      Pain Score      Pain Loc      Pain Edu?      Excl. in [place]?     Most recent vital signs: Vitals:   09/16/22 1030 09/16/22 1100  BP: (!) 135/52 (!) 120/57  Pulse: 88   Resp: 15 14  Temp:    SpO2: 94%      General: Awake, no distress.  CV:  Good peripheral perfusion.  Regular rate and rhythm. Resp:  Normal work of breathing.  Lungs clear. Abd:  No distention.  Minimal suprapubic tenderness.  No rebound guarding. Other:  Mild tenderness to palpation over the left hip, no bruising or deformity.  No shortening or external rotation.  No significant bruising or trauma noted.   ED Results / Procedures / Treatments   Labs (all labs ordered are listed, but only abnormal results are displayed) Labs Reviewed  COMPREHENSIVE METABOLIC PANEL - Abnormal; Notable for the following components:      Result Value   Potassium 3.2 (*)    Glucose, Bld 100 (*)    BUN 29 (*)    Calcium 8.8 (*)    Albumin 3.3 (*)    All other components within normal limits  CBC WITH DIFFERENTIAL/PLATELET - Abnormal;  Notable for the following components:   RBC 3.85 (*)    Hemoglobin 10.6 (*)    HCT 34.2 (*)    RDW 20.4 (*)    Lymphs Abs 0.6 (*)    All other components within normal limits  PROTIME-INR - Abnormal; Notable for the following components:   Prothrombin Time 15.4 (*)    All other components within normal limits  URINALYSIS, ROUTINE W REFLEX MICROSCOPIC - Abnormal; Notable for the following components:   Color, Urine YELLOW (*)    APPearance HAZY (*)    Hgb urine dipstick MODERATE (*)    Protein, ur 30 (*)    Nitrite POSITIVE (*)    Leukocytes,Ua SMALL (*)    Bacteria, UA MANY (*)    All other components within normal limits  CULTURE, BLOOD (ROUTINE X 2)  CULTURE, BLOOD (ROUTINE X 2)  LACTIC ACID, PLASMA  LACTIC ACID, PLASMA     EKG Sinus rhythm, trickle rate 94.  PR 183, QRS 119, QTc 477.  No acute ST elevations repress for any acute events of acute ischemic infarct.   RADIOLOGY CT head/C-spine: No recent fracture, CT head negative Chest x-ray: No pneumonia or acute abnormality, mild central venous congestion CT left hip: Chronic fracture, no acute abnormality, chronic AVN   I also independently reviewed and agree with radiologist interpretations.   PROCEDURES:  Critical Care performed: No   .1-3 Lead EKG Interpretation  Performed by: Duffy Bruce, MD Authorized by: Duffy Bruce, MD     Interpretation: normal     ECG rate:  80-90   ECG rate assessment: normal     Rhythm: sinus rhythm     Ectopy: none     Conduction: normal   Comments:     Indication: Weakness    MEDICATIONS ORDERED IN ED: Medications  morphine (PF) 2 MG/ML injection 2 mg (has no administration in time range)  acetaminophen (TYLENOL) 160 MG/5ML solution 650 mg (has no administration in time range)  cefTRIAXone (ROCEPHIN) 2 g in sodium chloride 0.9 % 100 mL IVPB (2 g Intravenous New Bag/Given 09/16/22 1125)  sodium chloride 0.9 % bolus 1,000 mL (0 mLs Intravenous Stopped 09/16/22 0943)      IMPRESSION / MDM / ASSESSMENT AND PLAN / ED COURSE  I reviewed the triage vital signs and the nursing notes.                              Differential diagnosis includes, but is not limited to, fall secondary to mechanical fall, generalized weakness from occult infection such as UTI or pneumonia, anemia, dehydration, AKI, polypharmacy, CVA, intracranial injury  Patient's presentation is most consistent with acute presentation with potential threat to life or bodily function.  The patient is on the cardiac monitor to evaluate for evidence of arrhythmia and/or significant heart rate changes   89 year old female with history of dementia, chronic hip pain secondary to AVN and prior hip fracture, here with fall.  Patient mildly confused, smells of urine on arrival.  Lab work shows no major leukocytosis.  CMP is near baseline although mildly elevated BUN to creatinine ratio consistent with dehydration.  Lactic acid normal.  Urinalysis consistent with UTI with pyuria as well as positive nitrites.  CT of the head and neck reviewed and are negative.  Plain film of the hip showed possible fracture, but this is likely chronic per CT.  Will admit for management of altered mental status secondary to UTI.   FINAL CLINICAL IMPRESSION(S) / ED DIAGNOSES   Final diagnoses:  Fall, initial encounter  Lower urinary tract infectious disease     Rx / DC Orders   ED Discharge Orders     None        Note:  This document was prepared using Dragon voice recognition software and may include unintentional dictation errors.   Duffy Bruce, MD 09/16/22 1140

## 2022-09-16 NOTE — ED Notes (Signed)
Pt family refused the morphine stating that "pt had an adverse reaction to one of the stronger pain meds before where they had to narcan her and if we could do something else that would be great." Told daughter that there was also tylenol ordered and we'd give her that and stay away from the morphine.

## 2022-09-16 NOTE — Assessment & Plan Note (Addendum)
Worsening weakness with subsequent fall, worsened confusion and noted change in urine morphology Urinalysis indicative of infection Started on IV Rocephin in the ER Will continue Urine culture Follow

## 2022-09-16 NOTE — Progress Notes (Signed)
PT Cancellation Note  Patient Details Name: Misty Carroll MRN: [id number] DOB: 06-10-1933   Cancelled Treatment:    Reason Eval/Treat Not Completed: Other (comment) Attempted to see pt for PT tx. Pt received in room in care of nursing staff & IV team initiating blood draw. Will re-attempt as able.  Lavone Nian, PT, DPT 09/16/22, 3:16 PM   [name] 09/16/2022, 3:16 PM

## 2022-09-17 DIAGNOSIS — G301 Alzheimer's disease with late onset: Secondary | ICD-10-CM

## 2022-09-17 DIAGNOSIS — F02818 Dementia in other diseases classified elsewhere, unspecified severity, with other behavioral disturbance: Secondary | ICD-10-CM

## 2022-09-17 DIAGNOSIS — N3000 Acute cystitis without hematuria: Secondary | ICD-10-CM

## 2022-09-17 DIAGNOSIS — G9340 Encephalopathy, unspecified: Secondary | ICD-10-CM | POA: Diagnosis not present

## 2022-09-17 DIAGNOSIS — Z515 Encounter for palliative care: Secondary | ICD-10-CM | POA: Diagnosis not present

## 2022-09-17 DIAGNOSIS — N390 Urinary tract infection, site not specified: Secondary | ICD-10-CM | POA: Diagnosis not present

## 2022-09-17 DIAGNOSIS — W19XXXA Unspecified fall, initial encounter: Secondary | ICD-10-CM | POA: Diagnosis not present

## 2022-09-17 DIAGNOSIS — I10 Essential (primary) hypertension: Secondary | ICD-10-CM

## 2022-09-17 LAB — COMPREHENSIVE METABOLIC PANEL
ALT: 13 U/L (ref 0–44)
AST: 22 U/L (ref 15–41)
Albumin: 3 g/dL — ABNORMAL LOW (ref 3.5–5.0)
Alkaline Phosphatase: 68 U/L (ref 38–126)
Anion gap: 12 (ref 5–15)
BUN: 17 mg/dL (ref 8–23)
CO2: 27 mmol/L (ref 22–32)
Calcium: 8.9 mg/dL (ref 8.9–10.3)
Chloride: 104 mmol/L (ref 98–111)
Creatinine, Ser: 0.58 mg/dL (ref 0.44–1.00)
GFR, Estimated: >60 mL/min (ref >60)
Glucose, Bld: 81 mg/dL (ref 70–99)
Potassium: 3.1 mmol/L — ABNORMAL LOW (ref 3.5–5.1)
Sodium: 140 mmol/L (ref 135–145)
Total Bilirubin: 0.5 mg/dL (ref 0.3–1.2)
Total Protein: 6.6 g/dL (ref 6.5–8.1)

## 2022-09-17 LAB — CBC
HCT: 31.7 % — ABNORMAL LOW (ref 36.0–46.0)
Hemoglobin: 9.8 g/dL — ABNORMAL LOW (ref 12.0–15.0)
MCH: 27.7 pg (ref 26.0–34.0)
MCHC: 30.9 g/dL (ref 30.0–36.0)
MCV: 89.5 fL (ref 80.0–100.0)
Platelets: 147 10*3/uL — ABNORMAL LOW (ref 150–400)
RBC: 3.54 MIL/uL — ABNORMAL LOW (ref 3.87–5.11)
RDW: 20.3 % — ABNORMAL HIGH (ref 11.5–15.5)
WBC: 5.5 10*3/uL (ref 4.0–10.5)
nRBC: 0 % (ref 0.0–0.2)

## 2022-09-17 LAB — LEVETIRACETAM LEVEL: Levetiracetam Lvl: <2 ug/mL — ABNORMAL LOW (ref 10.0–40.0)

## 2022-09-17 LAB — MAGNESIUM: Magnesium: 1.8 mg/dL (ref 1.7–2.4)

## 2022-09-17 MED ORDER — LEVOFLOXACIN 500 MG PO TABS
500.0000 mg | ORAL_TABLET | Freq: Every day | ORAL | Status: DC
  Administered 2022-09-17 – 2022-09-20 (×4): 500 mg via ORAL
  Filled 2022-09-17 (×4): qty 1

## 2022-09-17 MED ORDER — POTASSIUM CHLORIDE CRYS ER 20 MEQ PO TBCR: 40.0000 meq | EXTENDED_RELEASE_TABLET | Freq: Once | ORAL | Status: DC

## 2022-09-17 NOTE — Assessment & Plan Note (Signed)
Acute decompensation and baseline dementia in setting of acute UTI and weakness CT head grossly stable Treat UTI Consider palliative/hospice at discharge.  I had a long discussion with patient's 2 daughters at bedside.  They both shared patient has had significant clinical and functional decline over last 2 to 3 years.  She was staying at [facility] memory care due to her dementia but ran out of finances and was now placed at [facility] assisted [facility] for about a month but they are struggling there as well and hoping to find a long-term care place

## 2022-09-17 NOTE — TOC Progression Note (Signed)
Transition of Care [facility]) - Progression Note    Patient Details  Name: Misty Carroll MRN: [id number] Date of Birth: [birth year]  [facility] CM/SW Contact  Gerilyn Pilgrim, LCSW Phone Number: 09/17/2022, 1:25 PM  Clinical Narrative:   CSW spoke with patients sister Misty Carroll. Misty Carroll states she has been working with [facility] to see if pt can come there for LTC. Tiffany with [facility] aware and state that they may can admit pt on Monday for LTC. Tiffany to follow up with patients family and let TOC know if they agree to admit.          Expected Discharge Plan and Services                                               Social Determinants of Health (SDOH) Interventions SDOH Screenings   Tobacco Use: Low Risk  (12/02/2019)    Readmission Risk Interventions     No data to display

## 2022-09-17 NOTE — IPAL (Signed)
  Interdisciplinary Goals of Care Family Meeting   Date carried out: 09/17/2022  Location of the meeting: Bedside  Member's involved: Physician and Family Member or next of kin  Durable Power of Attorney or acting medical decision maker: 2 daughters-Juliana and Marcie Bal  Discussion: We discussed goals of care for Misty Carroll   Code status:   Code Status: DNR   Disposition: Home with [facility]osp[facility] with hospice likely on Monday or Tuesday  Time spent for the meeting: 35 minutes    Max Sane, MD  09/17/2022, 2:21 PM

## 2022-09-17 NOTE — Evaluation (Signed)
Physical Therapy Evaluation Patient Details Name: Misty Carroll MRN: [id number] DOB: Nov 04, 1932 Today's Date: 09/17/2022  History of Present Illness  Pt is an 89 y/o F admitted on 09/16/22 after presenting with c/o fall & UTI. PMH: Alzheimer's dementia, HTN, HLD, and hypothyroidism.   Clinical Impression  Pt with h/o demential and unable to provide any history.  Per daughter in room pt is non-ambulatory at baseline but staff at her ALF assists with SPT's and pt spends most of her time in a w/c that she is able to propel in the facility.  Pt found sitting at the EOB unsupported with good stability and was able to stand with min A but unable to advance either LE once in standing, likely limited by cognition and poor ability to follow cuing.  Pt was then able to SPT from the bed to the chair with min A and demonstrated good sequencing including good trunk flexion, lean for the far chair arm rest, and took several small shuffling steps laterally to the chair.  Pt appears to be close to her baseline level functionally confirmed by daughter present in room with daughter primarily concerned with pt getting increased level of care than she is currently receiving.  Daughter has been attempting to get pt into memory care but unable to find appropriate open beds and is now looking into LTC.  Pt will benefit from a trial of HHPT upon discharge to safely address deficits listed in patient problem list for decreased caregiver assistance and increased pt and caregiver safety.        Recommendations for follow up therapy are one component of a multi-disciplinary discharge planning process, led by the attending physician.  Recommendations may be updated based on patient status, additional functional criteria and insurance authorization.  Follow Up Recommendations Home health PT      Assistance Recommended at Discharge Frequent or constant Supervision/Assistance  Patient can return home with the following  A  little help with walking and/or transfers;A little help with bathing/dressing/bathroom;Assistance with cooking/housework;Direct supervision/assist for medications management;Assist for transportation;Help with stairs or ramp for entrance    Equipment Recommendations None recommended by PT  Recommendations for Other Services       Functional Status Assessment Patient has had a recent decline in their functional status and/or demonstrates limited ability to make significant improvements in function in a reasonable and predictable amount of time     Precautions / Restrictions Precautions Precautions: Fall Restrictions Weight Bearing Restrictions: No      Mobility  Bed Mobility               General bed mobility comments: NT, pt found sitting at the EOB    Transfers Overall transfer level: Needs assistance Equipment used: Rolling walker (2 wheels) Transfers: Sit to/from Stand, Bed to chair/wheelchair/BSC Sit to Stand: Min assist, From elevated surface Stand pivot transfers: Min assist         General transfer comment: Pt able to stand from an elevated EOB with min A but unable to advance either LE, limited by cognition    Ambulation/Gait               General Gait Details: Unable  Stairs            Wheelchair Mobility    Modified Rankin (Stroke Patients Only)       Balance Overall balance assessment: Needs assistance   Sitting balance-Leahy Scale: Normal     Standing balance support: Bilateral upper extremity supported, During  functional activity Standing balance-Leahy Scale: Poor                               Pertinent Vitals/Pain Pain Assessment Pain Assessment: PAINAD Breathing: normal Negative Vocalization: none Facial Expression: smiling or inexpressive Body Language: relaxed Consolability: no need to console PAINAD Score: 0    Home Living Family/patient expects to be discharged to:: Assisted living                  Home Equipment: BSC/3in1      Prior Function Prior Level of Function : Needs assist  Cognitive Assist : Mobility (cognitive);ADLs (cognitive)     Physical Assist : Mobility (physical);ADLs (physical) Mobility (physical): Bed mobility;Transfers ADLs (physical): Bathing;Dressing;Toileting Mobility Comments: SPT's only with assist at baseline, non-ambulatory, able to self-propel in w/c facility distances, no other know falls history other than current ADLs Comments: Assist from staff with ADLs     Hand Dominance   Dominant Hand: Right    Extremity/Trunk Assessment   Upper Extremity Assessment Upper Extremity Assessment: Generalized weakness    Lower Extremity Assessment Lower Extremity Assessment: Generalized weakness       Communication      Cognition Arousal/Alertness: Awake/alert Behavior During Therapy: WFL for tasks assessed/performed Overall Cognitive Status: History of cognitive impairments - at baseline                                          General Comments      Exercises     Assessment/Plan    PT Assessment Patient needs continued PT services  PT Problem List Decreased strength;Decreased mobility;Decreased balance       PT Treatment Interventions Functional mobility training;Therapeutic activities;Therapeutic exercise    PT Goals (Current goals can be found in the Care Plan section)  Acute Rehab PT Goals Patient Stated Goal: To transition to a facility with increased care PT Goal Formulation: With family Time For Goal Achievement: 09/30/22 Potential to Achieve Goals: Fair    Frequency Min 2X/week     Co-evaluation               AM-PAC PT "6 Clicks" Mobility  Outcome Measure Help needed turning from your back to your side while in a flat bed without using bedrails?: A Little Help needed moving from lying on your back to sitting on the side of a flat bed without using bedrails?: A Little Help needed moving to  and from a bed to a chair (including a wheelchair)?: A Little Help needed standing up from a chair using your arms (e.g., wheelchair or bedside chair)?: A Little Help needed to walk in hospital room?: Total Help needed climbing 3-5 steps with a railing? : Total 6 Click Score: 14    End of Session Equipment Utilized During Treatment: Gait belt Activity Tolerance: Patient tolerated treatment well Patient left: in chair;with call bell/phone within reach;with family/visitor present Nurse Communication: Mobility status;Other (comment) (Nursing notified that chair alarm in place but no box to plug it into, family present with pt) PT Visit Diagnosis: Muscle weakness (generalized) (M62.81)    Time: [id number] PT Time Calculation (min) (ACUTE ONLY): 20 min   Charges:   PT Evaluation $PT Eval Moderate Complexity: 1 Mod         D. Scott Danell Verno PT, DPT 09/17/22, 10:31 AM

## 2022-09-17 NOTE — Assessment & Plan Note (Signed)
Acute on chronic encephalopathy in setting of baseline Alzheimer's dementia with active UTI CT head stable Treat UTI

## 2022-09-17 NOTE — Assessment & Plan Note (Signed)
BP stable on hydrochlorothiazide

## 2022-09-17 NOTE — Assessment & Plan Note (Signed)
Fall at assisted living facility with weakness and confusion in the setting of UTI Plain films and CT imaging grossly negative for any acute fracture No reported head trauma or loss of consciousness Noted prior left hip fracture s/p repair appears stable Fall precautions PT OT recommends home health PT, OT Pain control Family is hoping for long-term care placement

## 2022-09-17 NOTE — Assessment & Plan Note (Signed)
Continue Keppra.

## 2022-09-17 NOTE — Hospital Course (Signed)
89 y.o. female with medical history significant of Alzheimer's dementia, hypertension, hyperlipidemia presenting with fall, UTI.  History primarily from patient's daughter as patient is acutely confused and sleeping.   3/15: Unable to get IV so transition to oral antibiotic for UTI.  Family interested in long-term care placement or consider hospice involvement at the facility 3/16: Added Norco for pain 3/17: Replacing potassium

## 2022-09-17 NOTE — Assessment & Plan Note (Signed)
Worsening weakness with subsequent fall, worsened confusion and noted change in urine morphology Urinalysis indicative of infection Started on IV Rocephin in the ER Switch to oral Levaquin as she does not have IV.  She pulled out when agitated Pending urine culture

## 2022-09-17 NOTE — Progress Notes (Signed)
OT Cancellation Note  Patient Details Name: Misty Carroll MRN: [id number] DOB: 07-Sep-1932   Cancelled Treatment:    Reason Eval/Treat Not Completed: OT screened, no needs identified, will sign off (Per daughter at bedside, pt is dependent for most ADLs at baseline; family now electing for hospice care at ALF. Denies OT needs at this time.)  Vania Rea 09/17/2022, 10:43 AM

## 2022-09-17 NOTE — Progress Notes (Signed)
  Progress Note   Patient: Misty Carroll [id number] DOB: [birth year] DOA: 09/16/2022     1 DOS: the patient was seen and examined on 09/17/2022   Brief hospital course:  89 y.o. female with medical history significant of Alzheimer's dementia, hypertension, hyperlipidemia presenting with fall, UTI.  History primarily from patient's daughter as patient is acutely confused and sleeping.   3/15: Unable to get IV so transition to oral antibiotic for UTI.  Family interested in long-term care placement or consider hospice involvement at the facility  Assessment and Plan: * UTI (urinary tract infection) Worsening weakness with subsequent fall, worsened confusion and noted change in urine morphology Urinalysis indicative of infection Started on IV Rocephin in the ER Switch to oral Levaquin as she does not have IV.  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  I had a long discussion with patient's 2 daughters at bedside.  They both shared patient has had significant clinical and functional decline over last 2 to 3 years.  She was staying at [facility] memory care due to her dementia but ran out of finances and was now placed at [facility] assisted  living for about a month but they are struggling there as well and hoping to find a long-term care place   Essential hypertension BP stable on hydrochlorothiazide   Seizures (HCC) Continue Keppra   Hypokalemia Replete and recheck, check magnesium      Subjective: Patient is sleeping comfortably.  No new issues  Physical Exam: Vitals:   09/16/22 1953 09/17/22 0432 09/17/22 0752 09/17/22 1251  BP: (!) 126/56 (!) 155/73 (!) 144/62   Pulse: 71 81 75   Resp: 20 20 12    Temp: (!) 97 F (36.1 C) 98.2 F (36.8 C) 97.6 F (36.4 C)   TempSrc: Axillary Axillary Oral   SpO2: 94% 98% 97%   Weight:    56 kg  Height:    5\' 6"  (1.25 m)   89 year old female who is underweight sleeping comfortably without any acute distress Lungs clear to auscultation bilaterally Heart regular rate and rhythm Abdomen soft, benign Neuro patient is sleepy, nonfocal exam  Data Reviewed:  K3.1  Family Communication: Both daughters updated at bedside  Disposition: Status is: Inpatient Remains inpatient appropriate because: Treatment of UTI and her mental status.  Facility placement pending  Planned Discharge Destination: Rehab   DVT prophylaxis-Lovenox Time spent: 35 minutes  Author: Max Sane, MD 09/17/2022 1:23 PM  For on call review [email or web address].

## 2022-09-18 DIAGNOSIS — G301 Alzheimer's disease with late onset: Secondary | ICD-10-CM | POA: Diagnosis not present

## 2022-09-18 DIAGNOSIS — E876 Hypokalemia: Secondary | ICD-10-CM | POA: Diagnosis not present

## 2022-09-18 DIAGNOSIS — N3000 Acute cystitis without hematuria: Secondary | ICD-10-CM | POA: Diagnosis not present

## 2022-09-18 DIAGNOSIS — G9340 Encephalopathy, unspecified: Secondary | ICD-10-CM | POA: Diagnosis not present

## 2022-09-18 LAB — BASIC METABOLIC PANEL
Anion gap: 9 (ref 5–15)
BUN: 16 mg/dL (ref 8–23)
CO2: 27 mmol/L (ref 22–32)
Calcium: 9 mg/dL (ref 8.9–10.3)
Chloride: 101 mmol/L (ref 98–111)
Creatinine, Ser: 0.63 mg/dL (ref 0.44–1.00)
GFR, Estimated: >60 mL/min (ref >60)
Glucose, Bld: 82 mg/dL (ref 70–99)
Potassium: 3.3 mmol/L — ABNORMAL LOW (ref 3.5–5.1)
Sodium: 137 mmol/L (ref 135–145)

## 2022-09-18 LAB — CBC
HCT: 36.8 % (ref 36.0–46.0)
Hemoglobin: 11.5 g/dL — ABNORMAL LOW (ref 12.0–15.0)
MCH: 27.7 pg (ref 26.0–34.0)
MCHC: 31.3 g/dL (ref 30.0–36.0)
MCV: 88.7 fL (ref 80.0–100.0)
Platelets: 156 10*3/uL (ref 150–400)
RBC: 4.15 MIL/uL (ref 3.87–5.11)
RDW: 19.8 % — ABNORMAL HIGH (ref 11.5–15.5)
WBC: 5.1 10*3/uL (ref 4.0–10.5)
nRBC: 0 % (ref 0.0–0.2)

## 2022-09-18 LAB — C DIFFICILE QUICK SCREEN W PCR REFLEX
C Diff antigen: NEGATIVE
C Diff interpretation: NOT DETECTED
C Diff toxin: NEGATIVE

## 2022-09-18 MED ORDER — HYDROCODONE-ACETAMINOPHEN 5-325 MG PO TABS: 1.0000 | ORAL_TABLET | Freq: Four times a day (QID) | ORAL | Status: DC | PRN

## 2022-09-18 MED ORDER — ACETAMINOPHEN 325 MG PO TABS
650.0000 mg | ORAL_TABLET | Freq: Four times a day (QID) | ORAL | Status: DC | PRN
  Administered 2022-09-19: 650 mg via ORAL
  Filled 2022-09-18: qty 2

## 2022-09-18 MED ORDER — POTASSIUM CHLORIDE CRYS ER 20 MEQ PO TBCR
40.0000 meq | EXTENDED_RELEASE_TABLET | Freq: Once | ORAL | Status: AC
  Administered 2022-09-18: 40 meq via ORAL
  Filled 2022-09-18: qty 2

## 2022-09-18 NOTE — Assessment & Plan Note (Signed)
BP stable on hydrochlorothiazide.

## 2022-09-18 NOTE — Progress Notes (Signed)
  Progress Note   Patient: Misty Carroll [id number] DOB: Jul 07, 1932 DOA: 09/16/2022     2 DOS: the patient was seen and examined on 09/18/2022   Brief hospital course:  89 y.o. female with medical history significant of Alzheimer's dementia, hypertension, hyperlipidemia presenting with fall, UTI.  History primarily from patient's daughter as patient is acutely confused and sleeping.   3/15: Unable to get IV so transition to oral antibiotic for UTI.  Family interested in long-term care placement or consider hospice involvement at the facility 3/16: Added Norco for pain  Assessment and Plan: * UTI (urinary tract infection) Worsening weakness with subsequent fall, worsened confusion and noted change in urine morphology Urinalysis indicative of infection Started on IV Rocephin in the ER Switch to oral Levaquin as she does not have IV.  She pulled out when agitated. Pending urine culture   Fall Fall at assisted living facility with weakness and confusion in the setting of UTI Plain films and CT imaging grossly negative for any acute fracture No reported head trauma or loss of consciousness Noted prior left hip fracture s/p repair appears stable Fall precautions PT OT recommends home health PT, OT Pain control Family is hoping for long-term care placement -plan for discharge to [place] for SNF/long-term care  Encephalopathy Acute on chronic encephalopathy in setting of baseline Alzheimer's dementia with active UTI CT head stable Treat UTI.   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  I had a long discussion with patient's 2 daughters at bedside.  They both shared patient has had significant clinical and functional decline over last 2 to 3 years.  She was staying at [facility] memory  care due to her dementia but ran out of finances and was now placed at [facility] assisted living for about a month but they are struggling there as well and hoping to find a long-term care place.  Likely discharge to [facility] per [facility] and family   Hospice care At [facility] upon discharge when appropriate  Essential hypertension BP stable on hydrochlorothiazide.   Seizures ([place]) Continue Keppra.   Hypokalemia Replete and recheck        Subjective: Remains confused/intermittent agitation and pulling out her clothes  Physical Exam: Vitals:   09/17/22 1701 09/17/22 2009 09/18/22 0609 09/18/22 0843  BP:  (!) 146/72 (!) 158/87 (!) 126/104  Pulse: 65 75 66 75  Resp: 16 18 18 14   Temp: 97.7 F (36.5 C) 97.8 F (36.6 C)    TempSrc: Oral     SpO2: 97% 96% 97% 95%  Weight:      Height:       89 year old female who is underweight sleeping comfortably without any acute distress Lungs clear to auscultation bilaterally Heart regular rate and rhythm Abdomen soft, benign Neuro awake and alert, intermittent agitation/confusion, nonfocal exam Psych: Agitated Data Reviewed:  Potassium 3.3  Family Communication: None at bedside  Disposition: Status is: Inpatient Remains inpatient appropriate because: Management of UTI and agitation  Planned Discharge Destination: Skilled nursing facility   DVT prophylaxis-Lovenox Time spent: 35 minutes  Author: Max Sane, MD 09/18/2022 9:49 AM  For on call review [email or web address].

## 2022-09-18 NOTE — Assessment & Plan Note (Signed)
Worsening weakness with subsequent fall, worsened confusion and noted change in urine morphology Urinalysis indicative of infection Started on IV Rocephin in the ER Switch to oral Levaquin as she does not have IV.  She pulled out when agitated. Pending urine culture

## 2022-09-18 NOTE — Assessment & Plan Note (Signed)
Acute decompensation and baseline dementia in setting of acute UTI and weakness CT head grossly stable Treat UTI Consider palliative/hospice at discharge.  I had a long discussion with patient's 2 daughters at bedside.  They both shared patient has had significant clinical and functional decline over last 2 to 3 years.  She was staying at [facility] memory care due to her dementia but ran out of finances and was now placed at [facility] assisted living for about a month but they are struggling there as well and hoping to find a long-term care place.  Likely discharge to [facility] per [facility] and family

## 2022-09-18 NOTE — Assessment & Plan Note (Signed)
Fall at assisted living facility with weakness and confusion in the setting of UTI Plain films and CT imaging grossly negative for any acute fracture No reported head trauma or loss of consciousness Noted prior left hip fracture s/p repair appears stable Fall precautions PT OT recommends home health PT, OT Pain control Family is hoping for long-term care placement -plan for discharge to [place] for SNF/long-term care

## 2022-09-18 NOTE — Assessment & Plan Note (Signed)
Replete and recheck ?

## 2022-09-18 NOTE — Assessment & Plan Note (Signed)
At [facility] upon discharge when appropriate

## 2022-09-18 NOTE — Assessment & Plan Note (Signed)
Continue Keppra.

## 2022-09-18 NOTE — Assessment & Plan Note (Signed)
Acute on chronic encephalopathy in setting of baseline Alzheimer's dementia with active UTI CT head stable Treat UTI.

## 2022-09-19 DIAGNOSIS — N3000 Acute cystitis without hematuria: Secondary | ICD-10-CM | POA: Diagnosis not present

## 2022-09-19 DIAGNOSIS — G9340 Encephalopathy, unspecified: Secondary | ICD-10-CM | POA: Diagnosis not present

## 2022-09-19 DIAGNOSIS — E876 Hypokalemia: Secondary | ICD-10-CM | POA: Diagnosis not present

## 2022-09-19 DIAGNOSIS — G301 Alzheimer's disease with late onset: Secondary | ICD-10-CM | POA: Diagnosis not present

## 2022-09-19 LAB — BASIC METABOLIC PANEL
Anion gap: 12 (ref 5–15)
BUN: 16 mg/dL (ref 8–23)
CO2: 26 mmol/L (ref 22–32)
Calcium: 8.7 mg/dL — ABNORMAL LOW (ref 8.9–10.3)
Chloride: 97 mmol/L — ABNORMAL LOW (ref 98–111)
Creatinine, Ser: 0.7 mg/dL (ref 0.44–1.00)
GFR, Estimated: >60 mL/min (ref >60)
Glucose, Bld: 87 mg/dL (ref 70–99)
Potassium: 2.8 mmol/L — ABNORMAL LOW (ref 3.5–5.1)
Sodium: 135 mmol/L (ref 135–145)

## 2022-09-19 LAB — CBC
HCT: 34.8 % — ABNORMAL LOW (ref 36.0–46.0)
Hemoglobin: 11 g/dL — ABNORMAL LOW (ref 12.0–15.0)
MCH: 27.4 pg (ref 26.0–34.0)
MCHC: 31.6 g/dL (ref 30.0–36.0)
MCV: 86.8 fL (ref 80.0–100.0)
Platelets: 162 10*3/uL (ref 150–400)
RBC: 4.01 MIL/uL (ref 3.87–5.11)
RDW: 19.6 % — ABNORMAL HIGH (ref 11.5–15.5)
WBC: 4.9 10*3/uL (ref 4.0–10.5)
nRBC: 0 % (ref 0.0–0.2)

## 2022-09-19 MED ORDER — ORAL CARE MOUTH RINSE: 15.0000 mL | OROMUCOSAL | Status: DC | PRN

## 2022-09-19 MED ORDER — POTASSIUM CHLORIDE CRYS ER 20 MEQ PO TBCR
40.0000 meq | EXTENDED_RELEASE_TABLET | Freq: Once | ORAL | Status: AC
  Administered 2022-09-19: 40 meq via ORAL
  Filled 2022-09-19: qty 2

## 2022-09-19 MED ORDER — AMLODIPINE BESYLATE 5 MG PO TABS
5.0000 mg | ORAL_TABLET | Freq: Every day | ORAL | Status: DC
  Administered 2022-09-19 – 2022-09-20 (×2): 5 mg via ORAL
  Filled 2022-09-19 (×2): qty 1

## 2022-09-19 NOTE — Assessment & Plan Note (Signed)
Replete and recheck.  Check magnesium

## 2022-09-19 NOTE — Assessment & Plan Note (Signed)
Fall at assisted living facility with weakness and confusion in the setting of UTI Plain films and CT imaging grossly negative for any acute fracture No reported head trauma or loss of consciousness Noted prior left hip fracture s/p repair appears stable Fall precautions PT OT recommends home health PT, OT Pain control Family is hoping for long-term care placement -plan for discharge to [place] for SNF/long-term care.

## 2022-09-19 NOTE — Assessment & Plan Note (Addendum)
Worsening weakness with subsequent fall, worsened confusion and noted change in urine morphology Urinalysis indicative of infection On oral Levaquin.  Will stop it at discharge

## 2022-09-19 NOTE — Assessment & Plan Note (Signed)
Continue Keppra.

## 2022-09-19 NOTE — Assessment & Plan Note (Signed)
Will start Norvasc and stop hydrochlorothiazide as HCTZ might be contributing to hypokalemia

## 2022-09-19 NOTE — Assessment & Plan Note (Signed)
At [facility] upon discharge when appropriate.

## 2022-09-19 NOTE — Assessment & Plan Note (Signed)
Acute decompensation and baseline dementia in setting of acute UTI and weakness CT head grossly stable Treat UTI Consider palliative/hospice at discharge.  I had a long discussion with patient's 2 daughters at bedside.  They both shared patient has had significant clinical and functional decline over last 2 to 3 years.  She was staying at [facility] memory care due to her dementia but ran out of finances and was now placed at [place] [facility] assisted living for about a month but they are struggling there as well and hoping to find a long-term care place.  Likely discharge to [facility] per [facility] and family.

## 2022-09-19 NOTE — Assessment & Plan Note (Signed)
Acute on chronic encephalopathy in setting of baseline Alzheimer's dementia with active UTI CT head stable Treat UTI.

## 2022-09-19 NOTE — Progress Notes (Signed)
  Progress Note   Patient: Misty Carroll [id number] DOB: 1933-03-23 DOA: 09/16/2022     3 DOS: the patient was seen and examined on 09/19/2022   Brief hospital course:  89 y.o. female with medical history significant of Alzheimer's dementia, hypertension, hyperlipidemia presenting with fall, UTI.  History primarily from patient's daughter as patient is acutely confused and sleeping.   3/15: Unable to get IV so transition to oral antibiotic for UTI.  Family interested in long-term care placement or consider hospice involvement at the facility 3/16: Added Norco for pain 3/17: Replacing potassium  Assessment and Plan: * UTI (urinary tract infection) Worsening weakness with subsequent fall, worsened confusion and noted change in urine morphology Urinalysis indicative of infection On oral Levaquin.  Will stop it at discharge    Fall Fall at assisted living facility with weakness and confusion in the setting of UTI Plain films and CT imaging grossly negative for any acute fracture No reported head trauma or loss of consciousness Noted prior left hip fracture s/p repair appears stable Fall precautions PT OT recommends home health PT, OT Pain control Family is hoping for long-term care placement -plan for discharge to [place] for SNF/long-term care.  Encephalopathy Acute on chronic encephalopathy in setting of baseline Alzheimer's dementia with active UTI CT head stable Treat UTI.   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  I had a long discussion with patient's 2 daughters at bedside.  They both shared patient has had significant clinical and functional decline over last 2 to 3 years.  She was staying at [facility] memory care due to her dementia but ran out of finances and was now  placed at [place] [facility] assisted living for about a month but they are struggling there as well and hoping to find a long-term care place.  Likely discharge to [facility] per [facility] and family.   Hospice care At [facility] upon discharge when appropriate.  Essential hypertension Will start Norvasc and stop hydrochlorothiazide as HCTZ might be contributing to hypokalemia   Seizures (HCC) Continue Keppra.   Hypokalemia Replete and recheck.  Check magnesium        Subjective: Remains intermittently confused and agitated  Physical Exam: Vitals:   09/18/22 1646 09/18/22 2000 09/19/22 0559 09/19/22 0722  BP:  136/89 (!) 147/66 (!) 142/93  Pulse: 76 82 81 77  Resp: 18 20 20 13   Temp: 98.8 F (37.1 C) 97.6 F (36.4 C) 97.9 F (36.6 C) 98 F (36.7 C)  TempSrc:  Oral Oral   SpO2: 97% 98% 94% 95%  Weight:      Height:       89 year old female who is underweight sleeping comfortably without any acute distress Lungs clear to auscultation bilaterally Heart regular rate and rhythm Abdomen soft, benign Neuro awake and alert, intermittent agitation/confusion, nonfocal exam Psych: Agitated Data Reviewed:  K2.8  Family Communication: None at bedside  Disposition: Status is: Inpatient Remains inpatient appropriate because: Waiting for placement  Planned Discharge Destination: Skilled nursing facility   DVT prophylaxis-Lovenox Time spent: 35 minutes  Author: Max Sane, MD 09/19/2022 11:23 AM  For on call review [email or web address].

## 2022-09-20 DIAGNOSIS — E876 Hypokalemia: Secondary | ICD-10-CM | POA: Diagnosis not present

## 2022-09-20 DIAGNOSIS — G9340 Encephalopathy, unspecified: Secondary | ICD-10-CM | POA: Diagnosis not present

## 2022-09-20 DIAGNOSIS — G301 Alzheimer's disease with late onset: Secondary | ICD-10-CM | POA: Diagnosis not present

## 2022-09-20 DIAGNOSIS — N3000 Acute cystitis without hematuria: Secondary | ICD-10-CM | POA: Diagnosis not present

## 2022-09-20 LAB — CBC
HCT: 34.2 % — ABNORMAL LOW (ref 36.0–46.0)
Hemoglobin: 10.7 g/dL — ABNORMAL LOW (ref 12.0–15.0)
MCH: 27.1 pg (ref 26.0–34.0)
MCHC: 31.3 g/dL (ref 30.0–36.0)
MCV: 86.6 fL (ref 80.0–100.0)
Platelets: 147 10*3/uL — ABNORMAL LOW (ref 150–400)
RBC: 3.95 MIL/uL (ref 3.87–5.11)
RDW: 19.3 % — ABNORMAL HIGH (ref 11.5–15.5)
WBC: 4.4 10*3/uL (ref 4.0–10.5)
nRBC: 0 % (ref 0.0–0.2)

## 2022-09-20 LAB — BASIC METABOLIC PANEL
Anion gap: 9 (ref 5–15)
BUN: 15 mg/dL (ref 8–23)
CO2: 25 mmol/L (ref 22–32)
Calcium: 8.8 mg/dL — ABNORMAL LOW (ref 8.9–10.3)
Chloride: 103 mmol/L (ref 98–111)
Creatinine, Ser: 0.73 mg/dL (ref 0.44–1.00)
GFR, Estimated: >60 mL/min (ref >60)
Glucose, Bld: 92 mg/dL (ref 70–99)
Potassium: 3.2 mmol/L — ABNORMAL LOW (ref 3.5–5.1)
Sodium: 137 mmol/L (ref 135–145)

## 2022-09-20 MED ORDER — AMLODIPINE BESYLATE 5 MG PO TABS: 5.0000 mg | ORAL_TABLET | Freq: Every day | ORAL | Status: DC

## 2022-09-20 MED ORDER — POTASSIUM CHLORIDE CRYS ER 20 MEQ PO TBCR
40.0000 meq | EXTENDED_RELEASE_TABLET | Freq: Once | ORAL | Status: AC
  Administered 2022-09-20: 40 meq via ORAL
  Filled 2022-09-20: qty 2

## 2022-09-20 NOTE — Care Management Important Message (Signed)
Important Message  Patient Details  Name: Misty Carroll MRN: [id number] Date of Birth: 04/11/1933   Medicare Important Message Given:  Other (see comment)  Disposition to discharge with hospice services.  Medicare IM withheld at this time out of respect for patient and family.    Dannette Barbara 09/20/2022, 10:55 AM

## 2022-09-20 NOTE — Progress Notes (Signed)
Physical Therapy Treatment Patient Details Name: Misty Carroll MRN: [id number] DOB: 03-07-1933 Today's Date: 09/20/2022   History of Present Illness Pt is an 89 y/o F admitted on 09/16/22 after presenting with c/o fall & UTI. PMH: Alzheimer's dementia, HTN, HLD, and hypothyroidism.    PT Comments    Pt required extensive reassurance and cuing to perform functional tasks along with physical assistance and +2 for safety.  Pt anxious at times during the session with extra time and reassurance provided during the session.  Pt was ultimately able to come to sitting at the EOB with +2 max A and presented with good static sitting balance.  Multiple attempts made to perform SPT from bed to chair with extensive cuing and reassurance to address anxiety/agitation. Pt ultimately assisted with the transfer including appropriate reaching for far side arm rest and taking several small shuffling steps from bed to chair with min-mod A.  Pt will benefit from HHPT upon discharge to safely address deficits listed in patient problem list for decreased caregiver assistance and eventual return to PLOF.      Recommendations for follow up therapy are one component of a multi-disciplinary discharge planning process, led by the attending physician.  Recommendations may be updated based on patient status, additional functional criteria and insurance authorization.  Follow Up Recommendations  Home health PT     Assistance Recommended at Discharge Frequent or constant Supervision/Assistance  Patient can return home with the following A little help with walking and/or transfers;A little help with bathing/dressing/bathroom;Assistance with cooking/housework;Direct supervision/assist for medications management;Assist for transportation;Help with stairs or ramp for entrance   Equipment Recommendations  None recommended by PT    Recommendations for Other Services       Precautions / Restrictions Precautions Precautions:  Fall Restrictions Weight Bearing Restrictions: No     Mobility  Bed Mobility Overal bed mobility: Needs Assistance Bed Mobility: Supine to Sit, Rolling Rolling: Mod assist   Supine to sit: Max assist, +2 for physical assistance     General bed mobility comments: Pt anxious with bed mobility and required significant cuing for sequencing and extensive assistance to perform tasks    Transfers Overall transfer level: Needs assistance Equipment used: Rolling walker (2 wheels)     Stand pivot transfers: Mod assist, +2 safety/equipment, Min assist         General transfer comment: Extensive cuing to initiate movement but ultimately pt able to perform SPT from bed to chair with min-mod A with chair arm dropped to assist with clearance to chair    Ambulation/Gait               General Gait Details: Unable   Stairs             Wheelchair Mobility    Modified Rankin (Stroke Patients Only)       Balance Overall balance assessment: Needs assistance   Sitting balance-Leahy Scale: Normal       Standing balance-Leahy Scale: Poor                              Cognition Arousal/Alertness: Awake/alert Behavior During Therapy: WFL for tasks assessed/performed Overall Cognitive Status: History of cognitive impairments - at baseline  Exercises      General Comments        Pertinent Vitals/Pain Pain Assessment Pain Assessment: PAINAD Breathing: normal Negative Vocalization: none Facial Expression: smiling or inexpressive Body Language: relaxed Consolability: no need to console PAINAD Score: 0 Pain Intervention(s): Monitored during session    Home Living                          Prior Function            PT Goals (current goals can now be found in the care plan section) Progress towards PT goals: PT to reassess next treatment    Frequency    Min  2X/week      PT Plan Current plan remains appropriate    Co-evaluation              AM-PAC PT "6 Clicks" Mobility   Outcome Measure  Help needed turning from your back to your side while in a flat bed without using bedrails?: A Little Help needed moving from lying on your back to sitting on the side of a flat bed without using bedrails?: A Lot Help needed moving to and from a bed to a chair (including a wheelchair)?: A Lot Help needed standing up from a chair using your arms (e.g., wheelchair or bedside chair)?: A Lot Help needed to walk in hospital room?: Total Help needed climbing 3-5 steps with a railing? : Total 6 Click Score: 11    End of Session Equipment Utilized During Treatment: Gait belt Activity Tolerance: Patient tolerated treatment well Patient left: in chair;with call bell/phone within reach;with family/visitor present;with chair alarm set Nurse Communication: Mobility status PT Visit Diagnosis: Muscle weakness (generalized) (M62.81)     Time: [id number] PT Time Calculation (min) (ACUTE ONLY): 24 min  Charges:  $Therapeutic Activity: 23-37 mins                     D. Scott Sneijder Bernards PT, DPT 09/20/22, 10:23 AM

## 2022-09-20 NOTE — NC FL2 (Signed)
[place] LEVEL OF CARE FORM     IDENTIFICATION  Patient Name: Misty Carroll Birthdate: January 11, 1933 Sex: female Admission Date (Current Location): 09/16/2022  [facility] and [place] Number:  Engineering geologist and Address:         Provider Number: [id number]  Attending Physician Name and Address:  Max Sane, MD  Relative Name and Phone Number:       Current Level of Care: Hospital Recommended Level of Care: [place] Prior Approval Number:    Date Approved/Denied:   PASRR Number: [id number]  Discharge Plan: SNF    Current Diagnoses: Patient Active Problem List   Diagnosis Date Noted   Hospice care 09/17/2022   UTI (urinary tract infection) 09/16/2022   Encephalopathy 09/16/2022   Malnutrition of moderate degree 12/03/2019   Acute lower UTI 12/03/2019   Unresponsiveness 12/01/2019   Seizures ([place]) 12/01/2019   Essential hypertension 12/01/2019   Alzheimer's type dementia with late onset with behavioral disturbance (HCC)    Abnormal urinalysis    Hyperglycemia    Orthostasis    Hyponatremia    Sleep disturbance    Closed nondisplaced intertrochanteric fracture of left femur (HCC)    Labile blood pressure    Hypokalemia    Acute blood loss anemia    Hypoalbuminemia due to protein-calorie malnutrition (HCC)    Traumatic subdural hematoma ([place]) 06/16/2018   Fall 06/13/2018   Uterine prolapse 01/19/2017   Cystocele, midline 01/19/2017   Cystocele with small rectocele and uterine descent 09/03/2016   Uterovaginal prolapse, incomplete 09/03/2016   Left-sided epistaxis 01/25/2013    Orientation RESPIRATION BLADDER Height & Weight     Self  Normal Incontinent Weight: 56 kg Height:  5\' 6"  (167.6 cm)  BEHAVIORAL SYMPTOMS/MOOD NEUROLOGICAL BOWEL NUTRITION STATUS      Incontinent Diet (Low sodium heart healthy)  AMBULATORY STATUS COMMUNICATION OF NEEDS Skin   Extensive Assist Verbally Bruising                        Personal Care Assistance Level of Assistance              Functional Limitations Info             SPECIAL CARE FACTORS FREQUENCY  PT (By licensed PT), OT (By licensed OT)                    Contractures Contractures Info: Not present    Additional Factors Info  Code Status, Allergies Code Status Info: DNR Allergies Info: Donespezil           Current Medications (09/20/2022):  This is the current hospital active medication list Current Facility-Administered Medications  Medication Dose Route Frequency Provider Last Rate Last Admin   acetaminophen (TYLENOL) tablet 650 mg  650 mg Oral Q6H PRN Max Sane, MD   650 mg at 09/19/22 2018   amLODipine (NORVASC) tablet 5 mg  5 mg Oral Daily Max Sane, MD   5 mg at 09/20/22 0919   cholecalciferol (VITAMIN D3) 25 MCG (1000 UNIT) tablet 1,000 Units  1,000 Units Oral Daily Deneise Lever, MD   1,000 Units at 09/20/22 0918   enoxaparin (LOVENOX) injection 40 mg  40 mg Subcutaneous Q24H Deneise Lever, MD   40 mg at 09/19/22 2021   ferrous sulfate tablet 325 mg  325 mg Oral Q breakfast Deneise Lever, MD   325 mg at [id number]   folic acid (  FOLVITE) tablet 0.5 mg  500 mcg Oral Daily Deneise Lever, MD   0.5 mg at 09/20/22 [id number]   HYDROcodone-acetaminophen (NORCO/VICODIN) 5-325 MG per tablet 1 tablet  1 tablet Oral Q6H PRN Max Sane, MD       levofloxacin (LEVAQUIN) tablet 500 mg  500 mg Oral Daily Max Sane, MD   500 mg at 09/20/22 0919   levothyroxine (SYNTHROID) tablet 50 mcg  50 mcg Oral Daily Deneise Lever, MD   50 mcg at 09/20/22 0554   morphine (PF) 2 MG/ML injection 2 mg  2 mg Intravenous Once Duffy Bruce, MD       ondansetron [facility]) tablet 4 mg  4 mg Oral Q6H PRN Deneise Lever, MD       Or   ondansetron [facility]) injection 4 mg  4 mg Intravenous Q6H PRN Deneise Lever, MD       Oral care mouth rinse  15 mL Mouth Rinse PRN Max Sane, MD       potassium chloride SA (KLOR-CON M) CR tablet  40 mEq  40 mEq Oral Once Max Sane, MD         Discharge Medications: Please see discharge summary for a list of discharge medications.  Relevant Imaging Results:  Relevant Lab Results:   Additional Information SSN: [id number]  Beverly Sessions, RN

## 2022-09-20 NOTE — Discharge Summary (Signed)
Physician Discharge Summary   Patient: Misty Carroll MRN: [id number] DOB: 1932-10-14  Admit date:     09/16/2022  Discharge date:   Discharge Physician: Max Sane   PCP: System, Provider Not In   Recommendations at discharge:    F/up with outpt providers as requested Consider Hospice while at the facility  Discharge Diagnoses: Principal Problem:   UTI (urinary tract infection) Active Problems:   Fall   Encephalopathy   Alzheimer's type dementia with late onset with behavioral disturbance ([place])   Hypokalemia   Seizures ([place])   Essential hypertension   Hospice care  Hospital Course:  89 y.o. female with medical history significant of Alzheimer's dementia, hypertension, hyperlipidemia presenting with fall, UTI.  History primarily from patient's daughter as patient is acutely confused and sleeping.   3/15: Unable to get IV so transition to oral antibiotic for UTI.  Family interested in long-term care placement or consider hospice involvement at the facility 3/16: Added Norco for pain 3/17: Replacing potassium  Assessment and Plan: * UTI (urinary tract infection) Worsening weakness with subsequent fall, worsened confusion and noted change in urine morphology Treated with Abx while in the Hospital and completed course.  Fall Fall at assisted living facility with weakness and confusion in the setting of UTI Plain films and CT imaging grossly negative for any acute fracture No reported head trauma or loss of consciousness Noted prior left hip fracture s/p repair appears stable Needs higher level of care Family is hoping for long-term care placement -plan for discharge to [place] for SNF/long-term care.  Encephalopathy Acute on chronic encephalopathy in setting of baseline Alzheimer's dementia with active UTI CT head stable Treated UTI.  Alzheimer's type dementia with late onset with behavioral disturbance ([place]) CT head grossly stable Treated UTI Consider  palliative/hospice at discharge.  I had a long discussion with patient's 2 daughters at bedside.  They both shared patient has had significant clinical and functional decline over last 2 to 3 years.    [facility] At [facility] upon discharge when appropriate.  Essential hypertension Will start Norvasc and stop hydrochlorothiazide as HCTZ might be contributing to hypokalemia  Seizures (HCC) Continue Keppra.  Hypokalemia Replaced         Disposition: Skilled nursing facility Diet recommendation:  Discharge Diet Orders (From admission, onward)     Start     Ordered   09/20/22 0000  Diet - low sodium heart healthy        09/20/22 0912           Carb modified diet DISCHARGE MEDICATION: Allergies as of 09/20/2022       Reactions   Donepezil Other (See Comments)   Made her feel funny.         Medication List     STOP taking these medications    acetaminophen 325 MG tablet Commonly known as: TYLENOL   Cholecalciferol 10 MCG (400 UNIT) Caps   Desitin 13 % Crea Generic drug: Zinc Oxide   ferrous sulfate 325 (65 FE) MG tablet   folic acid [id number] MCG tablet Commonly known as: FOLVITE   hydrochlorothiazide 25 MG tablet Commonly known as: HYDRODIURIL   lisinopril 2.5 MG tablet Commonly known as: ZESTRIL   magnesium oxide 400 MG tablet Commonly known as: MAG-OX   nystatin powder Generic drug: nystatin   Zocor 20 MG tablet Generic drug: simvastatin       TAKE these medications    amLODipine 5 MG tablet Commonly known as: NORVASC  Take 1 tablet (5 mg total) by mouth daily.   Keppra 500 MG tablet Generic drug: levETIRAcetam Take 500 mg by mouth 2 (two) times daily.   levothyroxine 50 MCG tablet Commonly known as: SYNTHROID Take 50 mcg by mouth daily. What changed: Another medication with the same name was removed. Continue taking this medication, and follow the directions you see here.   mirtazapine 15 MG tablet Commonly known as:  REMERON Take 7.5 mg by mouth at bedtime.   risperiDONE 0.5 MG tablet Commonly known as: RISPERDAL Take 0.5 mg by mouth at bedtime.        Contact information for after-discharge care     [facility] Preferred SNF .   Service: Skilled Nursing Contact information: [place] [phone]                    Discharge Exam: Danley Danker Weights   09/17/22 1251  Weight: 8 kg   89 year old female who is underweight lying comfortably without any acute distress Lungs clear to auscultation bilaterally Heart regular rate and rhythm Abdomen soft, benign Neuro awake and alert, intermittent agitation/confusion, nonfocal exam  Condition at discharge: poor  The results of significant diagnostics from this hospitalization (including imaging, microbiology, ancillary and laboratory) are listed below for reference.   Imaging Studies: CT Cervical Spine Wo Contrast  Result Date: 09/16/2022 CLINICAL DATA:  Trauma, fall EXAM: CT CERVICAL SPINE WITHOUT CONTRAST TECHNIQUE: Multidetector CT imaging of the cervical spine was performed without intravenous contrast. Multiplanar CT image reconstructions were also generated. RADIATION DOSE REDUCTION: This exam was performed according to the departmental dose-optimization program which includes automated exposure control, adjustment of the mA and/or kV according to patient size and/or use of iterative reconstruction technique. COMPARISON:  06/13/2018 FINDINGS: Alignment: Alignment of posterior margins of vertebral bodies is within normal limits. Skull base and vertebrae: No recent fracture is seen. Small smooth marginated calcification adjacent to the tip of spinous process of C7 vertebra may be residual from previous ligament injury. Soft tissues and spinal canal: There is no central spinal stenosis. Prevertebral soft  tissues are unremarkable. Disc levels: Degenerative changes are noted with disc space narrowing, bony spurs and encroachment of neural foramina at multiple levels, more so from C4-C7 levels. Upper chest: Scarring is seen in apices of both lungs with no significant change. Other: There is inhomogeneous attenuation in thyroid. IMPRESSION: No recent fracture is seen in cervical spine. Cervical spondylosis with encroachment of neural foramina at multiple levels, more so from C4-C7 levels. Electronically Signed   By: Elmer Picker M.D.   On: 09/16/2022 09:18   CT Hip Left Wo Contrast  Result Date: 09/16/2022 CLINICAL DATA:  [place].  Left hip pain. EXAM: CT OF THE LEFT HIP WITHOUT CONTRAST TECHNIQUE: Multidetector CT imaging of the left hip was performed according to the standard protocol. Multiplanar CT image reconstructions were also generated. RADIATION DOSE REDUCTION: This exam was performed according to the departmental dose-optimization program which includes automated exposure control, adjustment of the mA and/or kV according to patient size and/or use of iterative reconstruction technique. COMPARISON:  Radiographs, same date and prior preoperative CT scan from 2019 FINDINGS: Remote postsurgical changes related to prior femoral neck fracture with 3 cannulated hip screws. Severe degenerative changes involving the left hip with findings consistent with chronic AVN and destruction of the femoral head. Partial auto arthrodesis. The most lateral  cannulated hip screw extends through the femoral head and into the acetabulum. No acute bony findings. No evidence of acute superimposed hip or left sided pelvic fracture. The pubic symphysis and SI joints are intact. Grossly by CT the hip and pelvic musculature is intact. Significant fatty atrophy but no obvious muscle tear or intramuscular hematoma. Small to moderate-sized left hip joint effusion noted. No significant intrapelvic abnormalities. A pessary ring is noted  in the vagina. Advanced sigmoid colon diverticulosis. IMPRESSION: 1. Remote postsurgical changes related to prior femoral neck fracture with 3 cannulated hip screws. 2. Severe degenerative changes involving the left hip with findings consistent with chronic AVN, destruction of the femoral head and partial auto arthrodesis. 3. No acute bony findings. 4. Small to moderate-sized left hip joint effusion. Electronically Signed   By: Marijo Sanes M.D.   On: 09/16/2022 09:16   CT HEAD WO CONTRAST (5MM)  Result Date: 09/16/2022 CLINICAL DATA:  Trauma, fall EXAM: CT HEAD WITHOUT CONTRAST TECHNIQUE: Contiguous axial images were obtained from the base of the skull through the vertex without intravenous contrast. RADIATION DOSE REDUCTION: This exam was performed according to the departmental dose-optimization program which includes automated exposure control, adjustment of the mA and/or kV according to patient size and/or use of iterative reconstruction technique. COMPARISON:  12/01/2019 FINDINGS: Brain: No acute intracranial findings are seen. There are no signs of bleeding within the cranium. Cortical sulci are prominent. There is decreased density in periventricular white matter. Vascular: Unremarkable. Skull: No fracture is seen in calvarium. Sinuses/Orbits: There is mucosal thickening in ethmoid sinus. Other: There is increased amount of CSF insula suggesting partial empty sella. IMPRESSION: No acute intracranial findings are seen in noncontrast CT brain. Atrophy. Small-vessel disease. Electronically Signed   By: Elmer Picker M.D.   On: 09/16/2022 09:10   DG Chest Portable 1 View  Result Date: 09/16/2022 CLINICAL DATA:  Follow-up as this morning. EXAM: PORTABLE CHEST 1 VIEW COMPARISON:  None Available. FINDINGS: Normal cardiac silhouette. Ectatic aorta with calcification. Upper lobe apical scarring similar prior. There is bilateral venous congestion. No focal infiltrate. No pneumothorax. IMPRESSION: Central  venous congestion. Electronically Signed   By: Suzy Bouchard M.D.   On: 09/16/2022 08:41   DG Pelvis Portable  Result Date: 09/16/2022 CLINICAL DATA:  [place] out of bed.  Left hip pain. EXAM: PORTABLE PELVIS 1-2 VIEWS COMPARISON:  06/13/2018 intraoperative image of the left hip FINDINGS: Single AP view of the pelvis. Mild degradation secondary to patient positioning and extensive overlying support apparatus. The right femoral head is located. There has been fixation of the proximal left femur. Compared to the intraoperative exam of 06/13/2018, the femoral head appears impacted into the neck. Suboptimally evaluated on this single image. Osteopenia. Sacroiliac joints are symmetric. Underlying degenerative changes of the left-greater-than-right hips. IMPRESSION: Status post left proximal femur fixation. Since 06/13/2018, the femoral head is impacted into the neck. This is of indeterminate acuity. Consider further evaluation with CT. Electronically Signed   By: Abigail Miyamoto M.D.   On: 09/16/2022 08:23    Microbiology: Results for orders placed or performed during the hospital encounter of 09/16/22  Culture, blood (Routine x 2)     Status: None (Preliminary result)   Collection Time: 09/16/22  7:46 AM   Specimen: BLOOD  Result Value Ref Range Status   Specimen Description   Final    BLOOD LEFT [facility] Performed at [facility], [address], [place], [place] [address]    Special Requests  Final    BOTTLES DRAWN AEROBIC AND ANAEROBIC Blood Culture adequate volume Performed at [facility], [address], [place], [place] [address]    Culture   Final    NO GROWTH 4 DAYS Performed at [facility], [place] [address] [place], [place] [address]    Report Status PENDING  Incomplete  Culture, blood (Routine x 2)     Status: None (Preliminary result)   Collection Time: 09/16/22  7:48 AM   Specimen: BLOOD  Result Value Ref Range Status   Specimen Description   Final    BLOOD  BLOOD RIGHT WRIST Performed at [facility], [address] [place], [place] [address]    Special Requests   Final    BOTTLES DRAWN AEROBIC AND ANAEROBIC Blood Culture adequate volume Performed at [facility], [address] [place], [place] [address]    Culture   Final    NO GROWTH 4 DAYS Performed at [facility], [place] [address] [place], [place] [address]    Report Status PENDING  Incomplete  C Difficile Quick Screen w PCR reflex     Status: None   Collection Time: 09/18/22  3:20 AM   Specimen: STOOL  Result Value Ref Range Status   C Diff antigen NEGATIVE NEGATIVE Final   C Diff toxin NEGATIVE NEGATIVE Final   C Diff interpretation No C. difficile detected.  Final    Comment: Performed at [facility], [address] [place], [place] [address]    Labs: CBC: Recent Labs  Lab 09/16/22 [phone] 09/17/22 0702 09/18/22 0448 09/19/22 0437 09/20/22 0514  WBC 8.2 5.5 5.1 4.9 4.4  NEUTROABS 6.7  --   --   --   --   HGB 10.6* 9.8* 11.5* 11.0* 10.7*  HCT 34.2* 31.7* 36.8 34.8* 34.2*  MCV 88.8 89.5 88.7 86.8 86.6  PLT 189 147* 156 162 [id number]*   Basic Metabolic Panel: Recent Labs  Lab 09/16/22 0746 09/17/22 0702 09/18/22 0448 09/19/22 0437 09/20/22 0514  NA 136 140 137 135 137  K 3.2* 3.1* 3.3* 2.8* 3.2*  CL 103 104 101 97* 103  CO2 27 27 27 26 25   GLUCOSE 100* 81 82 87 92  BUN 29* 17 16 16 15   CREATININE 0.84 0.58 0.63 0.70 0.73  CALCIUM 8.8* 8.9 9.0 8.7* 8.8*  MG  --  1.8  --   --   --    Liver Function Tests: Recent Labs  Lab 09/16/22 0746 09/17/22 0702  AST 25 22  ALT 13 13  ALKPHOS 77 68  BILITOT 0.5 0.5  PROT 7.5 6.6  ALBUMIN 3.3* 3.0*   CBG: No results for input(s): "GLUCAP" in the last 168 hours.  Discharge time spent: greater than 30 minutes.  Signed: Max Sane, MD [facility] 09/20/2022

## 2022-09-20 NOTE — Plan of Care (Signed)
Patient discharged to [facility] via EMS. Report called to nurse receiving the patient at [facility]. Patient tolerated well. Daughter present and left with EMS.

## 2022-09-20 NOTE — TOC Transition Note (Signed)
Transition of Care [facility]) - CM/SW Discharge Note   Patient Details  Name: Misty Carroll MRN: [id number] Date of Birth: [birth year]  Transition of Care [facility]) CM/SW Contact:  Beverly Sessions, RN Phone Number: 09/20/2022, 10:17 AM   Clinical Narrative:     Patient will DC to: [facility] with [facility] Anticipated DC date: 09/20/22   Family notified: Daughter Mrs Tamala Julian Transport by: Johnanna Schneiders  Per MD patient ready for DC to . RN, patient's family, and facility notified of DC. Discharge Summary sent to facility. RN given number for report. DC packet on chart. Ambulance transport requested for patient.  TOC signing off.  [name] [facility] [phone]        Patient Goals and CMS Choice      Discharge Placement                         Discharge Plan and Services Additional resources added to the After Visit Summary for                                       Social Determinants of Health (SDOH) Interventions SDOH Screenings   Tobacco Use: Low Risk  (12/02/2019)     Readmission Risk Interventions     No data to display

## 2022-09-21 LAB — CULTURE, BLOOD (ROUTINE X 2)
Culture: NO GROWTH
Culture: NO GROWTH
Special Requests: ADEQUATE
Special Requests: ADEQUATE

## 2023-01-03 DEATH — deceased
